# Patient Record
Sex: Female | Born: 1955 | Race: Black or African American | Hispanic: No | Smoking: Current every day smoker
Health system: Southern US, Community
[De-identification: ages and names within clinical notes are randomized; demographics above are authoritative.]

## PROBLEM LIST (undated history)

## (undated) DIAGNOSIS — K635 Polyp of colon: Secondary | ICD-10-CM

## (undated) DIAGNOSIS — F32A Depression, unspecified: Secondary | ICD-10-CM

## (undated) DIAGNOSIS — E039 Hypothyroidism, unspecified: Secondary | ICD-10-CM

## (undated) DIAGNOSIS — B9681 Helicobacter pylori [H. pylori] as the cause of diseases classified elsewhere: Secondary | ICD-10-CM

## (undated) DIAGNOSIS — T148XXA Other injury of unspecified body region, initial encounter: Secondary | ICD-10-CM

## (undated) DIAGNOSIS — R45851 Suicidal ideations: Secondary | ICD-10-CM

## (undated) DIAGNOSIS — K859 Acute pancreatitis without necrosis or infection, unspecified: Secondary | ICD-10-CM

## (undated) DIAGNOSIS — F329 Major depressive disorder, single episode, unspecified: Secondary | ICD-10-CM

## (undated) DIAGNOSIS — I10 Essential (primary) hypertension: Secondary | ICD-10-CM

## (undated) DIAGNOSIS — H269 Unspecified cataract: Secondary | ICD-10-CM

## (undated) DIAGNOSIS — K2970 Gastritis, unspecified, without bleeding: Secondary | ICD-10-CM

## (undated) DIAGNOSIS — F1910 Other psychoactive substance abuse, uncomplicated: Secondary | ICD-10-CM

## (undated) DIAGNOSIS — F419 Anxiety disorder, unspecified: Secondary | ICD-10-CM

## (undated) DIAGNOSIS — E7800 Pure hypercholesterolemia, unspecified: Secondary | ICD-10-CM

## (undated) DIAGNOSIS — M1990 Unspecified osteoarthritis, unspecified site: Secondary | ICD-10-CM

## (undated) DIAGNOSIS — M8580 Other specified disorders of bone density and structure, unspecified site: Secondary | ICD-10-CM

## (undated) DIAGNOSIS — E119 Type 2 diabetes mellitus without complications: Secondary | ICD-10-CM

## (undated) DIAGNOSIS — A048 Other specified bacterial intestinal infections: Secondary | ICD-10-CM

## (undated) DIAGNOSIS — K219 Gastro-esophageal reflux disease without esophagitis: Secondary | ICD-10-CM

## (undated) DIAGNOSIS — F1410 Cocaine abuse, uncomplicated: Secondary | ICD-10-CM

## (undated) DIAGNOSIS — F1210 Cannabis abuse, uncomplicated: Secondary | ICD-10-CM

## (undated) DIAGNOSIS — K5790 Diverticulosis of intestine, part unspecified, without perforation or abscess without bleeding: Secondary | ICD-10-CM

## (undated) DIAGNOSIS — F319 Bipolar disorder, unspecified: Secondary | ICD-10-CM

## (undated) HISTORY — PX: CHOLECYSTECTOMY: SHX55

## (undated) HISTORY — DX: Polyp of colon: K63.5

## (undated) HISTORY — DX: Helicobacter pylori (H. pylori) as the cause of diseases classified elsewhere: B96.81

## (undated) HISTORY — DX: Other specified bacterial intestinal infections: A04.8

## (undated) HISTORY — DX: Gastritis, unspecified, without bleeding: K29.70

## (undated) HISTORY — DX: Diverticulosis of intestine, part unspecified, without perforation or abscess without bleeding: K57.90

## (undated) HISTORY — DX: Gastro-esophageal reflux disease without esophagitis: K21.9

## (undated) HISTORY — DX: Acute pancreatitis without necrosis or infection, unspecified: K85.90

## (undated) HISTORY — PX: UPPER GASTROINTESTINAL ENDOSCOPY: SHX188

## (undated) HISTORY — DX: Unspecified osteoarthritis, unspecified site: M19.90

## (undated) HISTORY — DX: Other specified disorders of bone density and structure, unspecified site: M85.80

## (undated) HISTORY — DX: Major depressive disorder, single episode, unspecified: F32.9

## (undated) HISTORY — DX: Unspecified cataract: H26.9

## (undated) HISTORY — PX: PARTIAL HYSTERECTOMY: SHX80

## (undated) HISTORY — PX: KNEE SURGERY: SHX244

## (undated) HISTORY — PX: SPLENECTOMY: SUR1306

## (undated) HISTORY — DX: Depression, unspecified: F32.A

## (undated) HISTORY — PX: COLONOSCOPY: SHX174

## (undated) HISTORY — PX: ROTATOR CUFF REPAIR: SHX139

---

## 2015-04-11 ENCOUNTER — Encounter (HOSPITAL_COMMUNITY): Payer: Self-pay

## 2015-04-11 ENCOUNTER — Emergency Department (HOSPITAL_COMMUNITY)
Admission: EM | Admit: 2015-04-11 | Discharge: 2015-04-11 | Disposition: A | Discharge status: Home / Self Care | Payer: Medicare Other | Attending: Emergency Medicine | Admitting: Emergency Medicine

## 2015-04-11 DIAGNOSIS — L259 Unspecified contact dermatitis, unspecified cause: Secondary | ICD-10-CM

## 2015-04-11 DIAGNOSIS — M791 Myalgia: Secondary | ICD-10-CM | POA: Diagnosis not present

## 2015-04-11 DIAGNOSIS — Z7982 Long term (current) use of aspirin: Secondary | ICD-10-CM | POA: Insufficient documentation

## 2015-04-11 DIAGNOSIS — Z7984 Long term (current) use of oral hypoglycemic drugs: Secondary | ICD-10-CM | POA: Diagnosis not present

## 2015-04-11 DIAGNOSIS — F17200 Nicotine dependence, unspecified, uncomplicated: Secondary | ICD-10-CM | POA: Insufficient documentation

## 2015-04-11 DIAGNOSIS — F419 Anxiety disorder, unspecified: Secondary | ICD-10-CM | POA: Diagnosis not present

## 2015-04-11 DIAGNOSIS — Z79899 Other long term (current) drug therapy: Secondary | ICD-10-CM | POA: Insufficient documentation

## 2015-04-11 DIAGNOSIS — E039 Hypothyroidism, unspecified: Secondary | ICD-10-CM | POA: Insufficient documentation

## 2015-04-11 DIAGNOSIS — F329 Major depressive disorder, single episode, unspecified: Secondary | ICD-10-CM | POA: Insufficient documentation

## 2015-04-11 DIAGNOSIS — I10 Essential (primary) hypertension: Secondary | ICD-10-CM | POA: Insufficient documentation

## 2015-04-11 DIAGNOSIS — R21 Rash and other nonspecific skin eruption: Secondary | ICD-10-CM | POA: Diagnosis present

## 2015-04-11 HISTORY — DX: Type 2 diabetes mellitus without complications: E11.9

## 2015-04-11 HISTORY — DX: Other injury of unspecified body region, initial encounter: T14.8XXA

## 2015-04-11 HISTORY — DX: Pure hypercholesterolemia, unspecified: E78.00

## 2015-04-11 HISTORY — DX: Hypothyroidism, unspecified: E03.9

## 2015-04-11 HISTORY — DX: Anxiety disorder, unspecified: F41.9

## 2015-04-11 HISTORY — DX: Essential (primary) hypertension: I10

## 2015-04-11 MED ORDER — HYDROXYZINE HCL 25 MG PO TABS: 25.0000 mg | ORAL_TABLET | Freq: Four times a day (QID) | ORAL | Status: DC

## 2015-04-11 MED ORDER — PREGABALIN 100 MG PO CAPS: 100.0000 mg | ORAL_CAPSULE | Freq: Three times a day (TID) | ORAL | Status: DC

## 2015-04-11 MED ORDER — DEXAMETHASONE 4 MG PO TABS
10.0000 mg | ORAL_TABLET | Freq: Once | ORAL | Status: AC
  Administered 2015-04-11: 10 mg via ORAL
  Filled 2015-04-11: qty 3

## 2015-04-11 NOTE — Discharge Instructions (Signed)
Continue try cortisone cream on this. Try the Atarax. Follow-up with a family doctor. Take Tylenol for generalized aches. Return for sudden fever. Shortness of breath. Contact Dermatitis Dermatitis is redness, soreness, and swelling (inflammation) of the skin. Contact dermatitis is a reaction to certain substances that touch the skin. There are two types of contact dermatitis:   Irritant contact dermatitis. This type is caused by something that irritates your skin, such as dry hands from washing them too much. This type does not require previous exposure to the substance for a reaction to occur. This type is more common.  Allergic contact dermatitis. This type is caused by a substance that you are allergic to, such as a nickel allergy or poison ivy. This type only occurs if you have been exposed to the substance (allergen) before. Upon a repeat exposure, your body reacts to the substance. This type is less common. CAUSES  Many different substances can cause contact dermatitis. Irritant contact dermatitis is most commonly caused by exposure to:   Makeup.   Soaps.   Detergents.   Bleaches.   Acids.   Metal salts, such as nickel.  Allergic contact dermatitis is most commonly caused by exposure to:   Poisonous plants.   Chemicals.   Jewelry.   Latex.   Medicines.   Preservatives in products, such as clothing.  RISK FACTORS This condition is more likely to develop in:   People who have jobs that expose them to irritants or allergens.  People who have certain medical conditions, such as asthma or eczema.  SYMPTOMS  Symptoms of this condition may occur anywhere on your body where the irritant has touched you or is touched by you. Symptoms include:  Dryness or flaking.   Redness.   Cracks.   Itching.   Pain or a burning feeling.   Blisters.  Drainage of small amounts of blood or clear fluid from skin cracks. With allergic contact dermatitis, there may  also be swelling in areas such as the eyelids, mouth, or genitals.  DIAGNOSIS  This condition is diagnosed with a medical history and physical exam. A patch skin test may be performed to help determine the cause. If the condition is related to your job, you may need to see an occupational medicine specialist. TREATMENT Treatment for this condition includes figuring out what caused the reaction and protecting your skin from further contact. Treatment may also include:   Steroid creams or ointments. Oral steroid medicines may be needed in more severe cases.  Antibiotics or antibacterial ointments, if a skin infection is present.  Antihistamine lotion or an antihistamine taken by mouth to ease itching.  A bandage (dressing). HOME CARE INSTRUCTIONS Skin Care  Moisturize your skin as needed.   Apply cool compresses to the affected areas.  Try taking a bath with:  Epsom salts. Follow the instructions on the packaging. You can get these at your local pharmacy or grocery store.  Baking soda. Pour a small amount into the bath as directed by your health care provider.  Colloidal oatmeal. Follow the instructions on the packaging. You can get this at your local pharmacy or grocery store.  Try applying baking soda paste to your skin. Stir water into baking soda until it reaches a paste-like consistency.  Do not scratch your skin.  Bathe less frequently, such as every other day.  Bathe in lukewarm water. Avoid using hot water. Medicines  Take or apply over-the-counter and prescription medicines only as told by your health care provider.  If you were prescribed an antibiotic medicine, take or apply your antibiotic as told by your health care provider. Do not stop using the antibiotic even if your condition starts to improve. General Instructions  Keep all follow-up visits as told by your health care provider. This is important.  Avoid the substance that caused your reaction. If you  do not know what caused it, keep a journal to try to track what caused it. Write down:  What you eat.  What cosmetic products you use.  What you drink.  What you wear in the affected area. This includes jewelry.  If you were given a dressing, take care of it as told by your health care provider. This includes when to change and remove it. SEEK MEDICAL CARE IF:   Your condition does not improve with treatment.  Your condition gets worse.  You have signs of infection such as swelling, tenderness, redness, soreness, or warmth in the affected area.  You have a fever.  You have new symptoms. SEEK IMMEDIATE MEDICAL CARE IF:   You have a severe headache, neck pain, or neck stiffness.  You vomit.  You feel very sleepy.  You notice red streaks coming from the affected area.  Your bone or joint underneath the affected area becomes painful after the skin has healed.  The affected area turns darker.  You have difficulty breathing.   This information is not intended to replace advice given to you by your health care provider. Make sure you discuss any questions you have with your health care provider.   Document Released: 12/24/1999 Document Revised: 09/16/2014 Document Reviewed: 05/13/2014 Elsevier Interactive Patient Education [employer].

## 2015-04-11 NOTE — ED Provider Notes (Signed)
CSN: [id number]     Arrival date & time 04/11/15  1348 History   First MD Initiated Contact with Patient 04/11/15 1740     Chief Complaint  Patient presents with  . Rash  . Anxiety     (Consider location/radiation/quality/duration/timing/severity/associated sxs/prior Treatment) Patient is a 60 y.o. female presenting with rash and anxiety. The history is provided by the patient.  Rash Location:  Torso Torso rash location:  L chest and R chest Quality: itchiness   Severity:  Moderate Onset quality:  Gradual Duration:  1 week Timing:  Constant Progression:  Worsening Chronicity:  New Relieved by:  Nothing Worsened by:  Nothing tried Ineffective treatments:  None tried Associated symptoms: myalgias   Associated symptoms: no fever, no headaches, no joint pain, no nausea, no shortness of breath, not vomiting and not wheezing   Anxiety Pertinent negatives include no chest pain, no headaches and no shortness of breath.   60 yo F With a chief complaint of an itchy rash. Is localized to her neck and her upper chest. Going on for the past 3 or 4 days. Tried Benadryl without relief. Patient has recently moved from [place] and she has been without her medications. Patient is concerned because she feels like she needs her Lyrica to survive. Patient started feeling some pains in bilateral legs. Having some aches all over. Denies any other infectious symptoms. She called the nurse triage line in [place] and they told her that she might have  Mites. Even though she has recently moved to the area she denies any new lotions creams. Denies any new laundry detergent. Denies any new necklaces  Past Medical History  Diagnosis Date  . Anxiety   . Hypertension   . Diabetes mellitus without complication ([place])   . Hypercholesteremia   . Hypothyroid   . Nerve damage     both legs   History reviewed. No pertinent past surgical history. No family history on file. Social History  Substance Use Topics   . Smoking status: Current Every Day Smoker  . Smokeless tobacco: None  . Alcohol Use: No   OB History    No data available     Review of Systems  Constitutional: Negative for fever and chills.  HENT: Negative for congestion and rhinorrhea.   Eyes: Negative for redness and visual disturbance.  Respiratory: Negative for shortness of breath and wheezing.   Cardiovascular: Negative for chest pain and palpitations.  Gastrointestinal: Negative for nausea and vomiting.  Genitourinary: Negative for dysuria and urgency.  Musculoskeletal: Positive for myalgias. Negative for arthralgias.  Skin: Positive for rash. Negative for pallor and wound.  Neurological: Negative for dizziness and headaches.      Allergies  Review of patient's allergies indicates not on file.  Home Medications   Prior to Admission medications   Medication Sig Start Date End Date Taking? Authorizing Provider  ALPRAZolam [name]) 1 MG tablet Take 1 mg by mouth 3 (three) times daily as needed for anxiety.   Yes Historical Provider, MD  aspirin EC 81 MG tablet Take 81 mg by mouth daily at 12 noon.   Yes Historical Provider, MD  levothyroxine (SYNTHROID, LEVOTHROID) 25 MCG tablet Take 25 mcg by mouth daily before breakfast.   Yes Historical Provider, MD  metFORMIN (GLUCOPHAGE) 500 MG tablet Take 500 mg by mouth daily with breakfast.   Yes Historical Provider, MD  omeprazole (PRILOSEC) 20 MG capsule Take 20 mg by mouth daily.   Yes Historical Provider, MD  QUEtiapine (SEROQUEL)  400 MG tablet Take 400 mg by mouth at bedtime.   Yes Historical Provider, MD  QUEtiapine (SEROQUEL) 50 MG tablet Take 50 mg by mouth daily at 12 noon.   Yes Historical Provider, MD  zolpidem (AMBIEN) 10 MG tablet Take 10 mg by mouth at bedtime.   Yes Historical Provider, MD  hydrOXYzine (ATARAX/VISTARIL) 25 MG tablet Take 1 tablet (25 mg total) by mouth every 6 (six) hours. 04/11/15   Deno Etienne, DO  pregabalin (LYRICA) 100 MG capsule Take 1 capsule  (100 mg total) by mouth 3 (three) times daily. 04/11/15   Deno Etienne, DO   BP 150/91 mmHg  Pulse 84  Temp(Src) 98.2 F (36.8 C) (Oral)  Resp 16  Ht 5\' 5"  (1.651 m)  Wt 172 lb 9.6 oz (78.291 kg)  BMI 28.72 kg/m2  SpO2 100% Physical Exam  Constitutional: She is oriented to person, place, and time. She appears well-developed and well-nourished. No distress.  HENT:  Head: Normocephalic and atraumatic.  Eyes: EOM are normal. Pupils are equal, round, and reactive to light.  Neck: Normal range of motion. Neck supple.  Cardiovascular: Normal rate and regular rhythm.  Exam reveals no gallop and no friction rub.   No murmur heard. Pulmonary/Chest: Effort normal. She has no wheezes. She has no rales.  Abdominal: Soft. She exhibits no distension. There is no tenderness.  Musculoskeletal: She exhibits no edema or tenderness.  Neurological: She is alert and oriented to person, place, and time.  Skin: Skin is warm and dry. She is not diaphoretic.  Excoriations about the neck and the upper chest. Hyperpigmented lesions with central core noted to the chest.  Psychiatric: She has a normal mood and affect. Her behavior is normal.  Nursing note and vitals reviewed.   ED Course  Procedures (including critical care time) Labs Review Labs Reviewed - No data to display  Imaging Review No results found. I have personally reviewed and evaluated these images and lab results as part of my medical decision-making.   EKG Interpretation None      MDM   Final diagnoses:  Contact dermatitis    61 yo F the chief complaint of a rash. Feel like this is likely a contact issue as its in the exposed area. Appears to be itchy. Patient without any other significant systemic symptoms. Will give the patient a dose of steroids as well as Atarax. Patient was given 1 week's worth of Lyrica. Given follow-up for a neurosurgeon as well as a gastroenterologist as well as the family clinic.  6:50 PM:  I have discussed  the diagnosis/risks/treatment options with the patient and believe the pt to be eligible for discharge home to follow-up with PCP. We also discussed returning to the ED immediately if new or worsening sx occur. We discussed the sx which are most concerning (e.g., sudden worsening pain, fever, inability to tolerate by mouth) that necessitate immediate return. Medications administered to the patient during their visit and any new prescriptions provided to the patient are listed below.  Medications given during this visit Medications  dexamethasone (DECADRON) tablet 10 mg (10 mg Oral Given 04/11/15 1829)    There are no discharge medications for this patient.   The patient appears reasonably screen and/or stabilized for discharge and I doubt any other medical condition or other [facility] requiring further screening, evaluation, or treatment in the ED at this time prior to discharge.      Deno Etienne, DO 04/11/15 1850

## 2015-04-11 NOTE — ED Notes (Signed)
Pt reports burning/itching rash to right side of chest that she noticed on Monday. OTC meds not working. She reports she felt very anxious with the itching. She took two benadryl tablets at 11am this morning.

## 2015-04-11 NOTE — ED Notes (Signed)
MD assessed and discharged pt before RN assessment.

## 2015-04-13 ENCOUNTER — Telehealth: Payer: Self-pay | Admitting: *Deleted

## 2015-04-13 NOTE — Telephone Encounter (Signed)
Pt called stating she was unsuccessful in obtaining PCP appointment and is in need of maintenance medications.  ERCM scheduled appointment 4/18 @ 2:30 with Cammie Sickle, NP at [facility].

## 2015-04-15 ENCOUNTER — Inpatient Hospital Stay: Payer: Medicare Other

## 2015-04-21 ENCOUNTER — Emergency Department (HOSPITAL_COMMUNITY): Payer: Medicare Other

## 2015-04-21 ENCOUNTER — Emergency Department (HOSPITAL_COMMUNITY)
Admission: EM | Admit: 2015-04-21 | Discharge: 2015-04-22 | Disposition: A | Discharge status: Other Acute Inpatient Hospital | Payer: Medicare Other | Attending: Emergency Medicine | Admitting: Emergency Medicine

## 2015-04-21 ENCOUNTER — Encounter (HOSPITAL_COMMUNITY): Payer: Self-pay | Admitting: Family Medicine

## 2015-04-21 DIAGNOSIS — E119 Type 2 diabetes mellitus without complications: Secondary | ICD-10-CM | POA: Insufficient documentation

## 2015-04-21 DIAGNOSIS — Z79899 Other long term (current) drug therapy: Secondary | ICD-10-CM | POA: Insufficient documentation

## 2015-04-21 DIAGNOSIS — Z7982 Long term (current) use of aspirin: Secondary | ICD-10-CM | POA: Insufficient documentation

## 2015-04-21 DIAGNOSIS — E782 Mixed hyperlipidemia: Secondary | ICD-10-CM | POA: Insufficient documentation

## 2015-04-21 DIAGNOSIS — R443 Hallucinations, unspecified: Secondary | ICD-10-CM | POA: Insufficient documentation

## 2015-04-21 DIAGNOSIS — F419 Anxiety disorder, unspecified: Secondary | ICD-10-CM | POA: Insufficient documentation

## 2015-04-21 DIAGNOSIS — F1210 Cannabis abuse, uncomplicated: Secondary | ICD-10-CM | POA: Insufficient documentation

## 2015-04-21 DIAGNOSIS — R45851 Suicidal ideations: Secondary | ICD-10-CM

## 2015-04-21 DIAGNOSIS — F1310 Sedative, hypnotic or anxiolytic abuse, uncomplicated: Secondary | ICD-10-CM | POA: Insufficient documentation

## 2015-04-21 DIAGNOSIS — E039 Hypothyroidism, unspecified: Secondary | ICD-10-CM | POA: Insufficient documentation

## 2015-04-21 DIAGNOSIS — F17200 Nicotine dependence, unspecified, uncomplicated: Secondary | ICD-10-CM | POA: Insufficient documentation

## 2015-04-21 DIAGNOSIS — I10 Essential (primary) hypertension: Secondary | ICD-10-CM | POA: Insufficient documentation

## 2015-04-21 LAB — CBC
HCT: 41.8 % (ref 36.0–46.0)
Hemoglobin: 13.4 g/dL (ref 12.0–15.0)
MCH: 28.1 pg (ref 26.0–34.0)
MCHC: 32.1 g/dL (ref 30.0–36.0)
MCV: 87.6 fL (ref 78.0–100.0)
PLATELETS: 385 10*3/uL (ref 150–400)
RBC: 4.77 MIL/uL (ref 3.87–5.11)
RDW: 14.7 % (ref 11.5–15.5)
WBC: 9.4 10*3/uL (ref 4.0–10.5)

## 2015-04-21 LAB — COMPREHENSIVE METABOLIC PANEL
ALK PHOS: 77 U/L (ref 38–126)
ALT: 17 U/L (ref 14–54)
ANION GAP: 16 — AB (ref 5–15)
AST: 22 U/L (ref 15–41)
Albumin: 3.4 g/dL — ABNORMAL LOW (ref 3.5–5.0)
BUN: 8 mg/dL (ref 6–20)
CO2: 17 mmol/L — ABNORMAL LOW (ref 22–32)
Calcium: 9.7 mg/dL (ref 8.9–10.3)
Chloride: 108 mmol/L (ref 101–111)
Creatinine, Ser: 0.67 mg/dL (ref 0.44–1.00)
GFR calc Af Amer: >60 mL/min (ref >60)
GLUCOSE: 118 mg/dL — AB (ref 65–99)
POTASSIUM: 3.8 mmol/L (ref 3.5–5.1)
Sodium: 141 mmol/L (ref 135–145)
TOTAL PROTEIN: 7.2 g/dL (ref 6.5–8.1)
Total Bilirubin: 0.6 mg/dL (ref 0.3–1.2)

## 2015-04-21 LAB — LITHIUM LEVEL: Lithium Lvl: <0.06 mmol/L — ABNORMAL LOW (ref 0.60–1.20)

## 2015-04-21 LAB — RAPID URINE DRUG SCREEN, HOSP PERFORMED
Amphetamines: NOT DETECTED
BENZODIAZEPINES: POSITIVE — AB
Barbiturates: NOT DETECTED
COCAINE: NOT DETECTED
Opiates: NOT DETECTED
Tetrahydrocannabinol: POSITIVE — AB

## 2015-04-21 LAB — I-STAT TROPONIN, ED: Troponin i, poc: 0 ng/mL (ref 0.00–0.08)

## 2015-04-21 LAB — SALICYLATE LEVEL: Salicylate Lvl: <4 mg/dL (ref 2.8–30.0)

## 2015-04-21 LAB — ETHANOL

## 2015-04-21 LAB — ACETAMINOPHEN LEVEL

## 2015-04-21 MED ORDER — PANTOPRAZOLE SODIUM 40 MG PO TBEC
40.0000 mg | DELAYED_RELEASE_TABLET | Freq: Every day | ORAL | Status: DC
  Administered 2015-04-21: 40 mg via ORAL
  Filled 2015-04-21: qty 1

## 2015-04-21 MED ORDER — NICOTINE 21 MG/24HR TD PT24
21.0000 mg | MEDICATED_PATCH | Freq: Every day | TRANSDERMAL | Status: DC
  Administered 2015-04-21: 21 mg via TRANSDERMAL
  Filled 2015-04-21: qty 1

## 2015-04-21 MED ORDER — LORAZEPAM 0.5 MG PO TABS
0.5000 mg | ORAL_TABLET | Freq: Once | ORAL | Status: AC
  Administered 2015-04-21: 0.5 mg via ORAL
  Filled 2015-04-21: qty 1

## 2015-04-21 MED ORDER — ZOLPIDEM TARTRATE 5 MG PO TABS
10.0000 mg | ORAL_TABLET | Freq: Every day | ORAL | Status: DC
  Administered 2015-04-21: 10 mg via ORAL
  Filled 2015-04-21: qty 2

## 2015-04-21 MED ORDER — QUETIAPINE FUMARATE 25 MG PO TABS: 50.0000 mg | ORAL_TABLET | Freq: Every day | ORAL | Status: DC

## 2015-04-21 MED ORDER — LORAZEPAM 1 MG PO TABS
1.0000 mg | ORAL_TABLET | Freq: Three times a day (TID) | ORAL | Status: DC | PRN
  Administered 2015-04-21: 1 mg via ORAL
  Filled 2015-04-21 (×2): qty 1

## 2015-04-21 MED ORDER — IBUPROFEN 400 MG PO TABS: 600.0000 mg | ORAL_TABLET | Freq: Three times a day (TID) | ORAL | Status: DC | PRN

## 2015-04-21 MED ORDER — METFORMIN HCL 500 MG PO TABS: 500.0000 mg | ORAL_TABLET | Freq: Every day | ORAL | Status: DC

## 2015-04-21 MED ORDER — HYDROXYZINE HCL 25 MG PO TABS
25.0000 mg | ORAL_TABLET | Freq: Four times a day (QID) | ORAL | Status: DC
  Administered 2015-04-21 (×2): 25 mg via ORAL
  Filled 2015-04-21 (×2): qty 1

## 2015-04-21 MED ORDER — LEVOTHYROXINE SODIUM 25 MCG PO TABS
25.0000 ug | ORAL_TABLET | Freq: Every day | ORAL | Status: DC
  Filled 2015-04-21: qty 1

## 2015-04-21 MED ORDER — PREGABALIN 100 MG PO CAPS
100.0000 mg | ORAL_CAPSULE | Freq: Three times a day (TID) | ORAL | Status: DC
  Filled 2015-04-21: qty 2

## 2015-04-21 MED ORDER — QUETIAPINE FUMARATE 200 MG PO TABS
400.0000 mg | ORAL_TABLET | Freq: Every day | ORAL | Status: DC
  Administered 2015-04-21: 400 mg via ORAL
  Filled 2015-04-21: qty 2

## 2015-04-21 MED ORDER — LORAZEPAM 1 MG PO TABS
1.0000 mg | ORAL_TABLET | Freq: Once | ORAL | Status: AC
  Administered 2015-04-21: 1 mg via ORAL

## 2015-04-21 NOTE — ED Notes (Signed)
Staffing  Notified that a sitter is needed.
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
## 2015-04-21 NOTE — Progress Notes (Signed)
[facility] RN Maudie Mercury was informed that patient can arrive at [facility] after midnight.  Verlon Setting, [place] Disposition staff 04/21/2015 8:29 PM

## 2015-04-21 NOTE — ED Notes (Signed)
Pt yelling and cursing, stating that all the staff is raciest.  Pt requesting to speak to MD.  Dr. Winfred Leeds made aware.   When Dr. Winfred Leeds went into room to speak to pt.  Pt started yelling again and cursing him.  Security  And [employer] outside of room

## 2015-04-21 NOTE — ED Notes (Signed)
TTS is in progress in PT room.

## 2015-04-21 NOTE — ED Notes (Addendum)
Pt here with symptoms of anxiety. sts she is tired of the cycle she is in. sts she has been going through this too long. sts depressed. 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

## 2015-04-21 NOTE — ED Notes (Signed)
Patient became upset about the rules in the hand out in Pod C... Patient stated that the rules of the hospital didn't apply to her life style... She stated that she breaks the rules and no body will tell her what to do.Marland KitchenMarland KitchenPatient called her son got upset and hung up on him and then made another phone call... Patient tried to make other calls.Nurse stated that she has used her two calls already.Marland KitchenMarland KitchenPatient stated that she needed more the 2 calls to handle her business. I reminded her of the rules of 2 phone calls.. She stated that I was unprofessional and making her upset she didn't need staff telling her no rules she knew the rules and didn't care about them and that as a man I know I was wrong and the was rules was stupid and made no sense.Marland KitchenMarland KitchenSecurity came to calm her down and she told the security officer that he was racist and she hated racist people and everyone need to leave her alone.... She continued to cuss at staff and security and wanted everybody's name so she could report everyone to administration.Marland KitchenMarland Kitchen

## 2015-04-21 NOTE — ED Provider Notes (Signed)
11 PM patient is resting comfortably in bed, reading a book after treatment with oral Ativan  Orlie Dakin, MD 04/21/15 2320
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
## 2015-04-21 NOTE — ED Provider Notes (Signed)
4:15 PM: Care assumed from [facility], PA-C at shift change. Patient with PMH of anxiety and hypertension who presents with SI.  Some chest tightness x 2 days, EKG without acute abnormalities, troponin 0.00. Unlikely ACS given constant chest tightness x 2 days and no elevation of troponin.  Likely anxiety related.  Patient has had no complaints since arriving in the ED. Remaining labs without acute abnormalities. TTS has been consulted for appropriate disposition.  Home meds have been reordered.  7:00 PM: Patient moved to [facility], PA-C 04/21/15 [id number]  Merrily Pew, MD 04/24/15 [phone]

## 2015-04-21 NOTE — ED Provider Notes (Signed)
8:30 PM patient is yelling at the top of her lungs and stating that all nursing staff attack staff is conspiring against her. She is wishes to leave the hospital. ED records shows suicidal ideation and auditory hallucinations. I've filed involuntary commitment forms to keep patient here as well as first exam form. Additional oral Ativan ordered  Orlie Dakin, MD 04/21/15 [id number]

## 2015-04-22 ENCOUNTER — Inpatient Hospital Stay (HOSPITAL_COMMUNITY)
Admission: AD | Admit: 2015-04-22 | Discharge: 2015-04-25 | DRG: 885 | Disposition: A | Discharge status: Home / Self Care | Payer: 59 | Source: Intra-hospital | Attending: Psychiatry | Admitting: Psychiatry

## 2015-04-22 ENCOUNTER — Encounter (HOSPITAL_COMMUNITY): Payer: Self-pay | Admitting: *Deleted

## 2015-04-22 DIAGNOSIS — E039 Hypothyroidism, unspecified: Secondary | ICD-10-CM

## 2015-04-22 DIAGNOSIS — E785 Hyperlipidemia, unspecified: Secondary | ICD-10-CM

## 2015-04-22 DIAGNOSIS — E872 Acidosis, unspecified: Secondary | ICD-10-CM

## 2015-04-22 DIAGNOSIS — F17210 Nicotine dependence, cigarettes, uncomplicated: Secondary | ICD-10-CM | POA: Diagnosis present

## 2015-04-22 DIAGNOSIS — E1165 Type 2 diabetes mellitus with hyperglycemia: Secondary | ICD-10-CM | POA: Diagnosis present

## 2015-04-22 DIAGNOSIS — R45851 Suicidal ideations: Secondary | ICD-10-CM | POA: Diagnosis present

## 2015-04-22 DIAGNOSIS — F3130 Bipolar disorder, current episode depressed, mild or moderate severity, unspecified: Secondary | ICD-10-CM | POA: Diagnosis not present

## 2015-04-22 DIAGNOSIS — I10 Essential (primary) hypertension: Secondary | ICD-10-CM | POA: Diagnosis present

## 2015-04-22 DIAGNOSIS — F3163 Bipolar disorder, current episode mixed, severe, without psychotic features: Principal | ICD-10-CM | POA: Diagnosis present

## 2015-04-22 DIAGNOSIS — F319 Bipolar disorder, unspecified: Secondary | ICD-10-CM | POA: Diagnosis present

## 2015-04-22 DIAGNOSIS — E038 Other specified hypothyroidism: Secondary | ICD-10-CM | POA: Diagnosis not present

## 2015-04-22 LAB — GLUCOSE, CAPILLARY
GLUCOSE-CAPILLARY: 166 mg/dL — AB (ref 65–99)
GLUCOSE-CAPILLARY: 120 mg/dL — AB (ref 65–99)
Glucose-Capillary: 126 mg/dL — ABNORMAL HIGH (ref 65–99)

## 2015-04-22 MED ORDER — ALPRAZOLAM 0.5 MG PO TABS
1.0000 mg | ORAL_TABLET | Freq: Three times a day (TID) | ORAL | Status: DC | PRN
  Administered 2015-04-22: 1 mg via ORAL
  Filled 2015-04-22: qty 2

## 2015-04-22 MED ORDER — ACETAMINOPHEN 325 MG PO TABS: 650.0000 mg | ORAL_TABLET | Freq: Four times a day (QID) | ORAL | Status: DC | PRN

## 2015-04-22 MED ORDER — QUETIAPINE FUMARATE 100 MG PO TABS
100.0000 mg | ORAL_TABLET | Freq: Once | ORAL | Status: DC
  Filled 2015-04-22: qty 1

## 2015-04-22 MED ORDER — INSULIN ASPART 100 UNIT/ML ~~LOC~~ SOLN: 4.0000 [IU] | Freq: Three times a day (TID) | SUBCUTANEOUS | Status: DC

## 2015-04-22 MED ORDER — AMLODIPINE BESYLATE 5 MG PO TABS
5.0000 mg | ORAL_TABLET | Freq: Every day | ORAL | Status: DC
  Administered 2015-04-22 – 2015-04-25 (×4): 5 mg via ORAL
  Filled 2015-04-22 (×6): qty 1
  Filled 2015-04-22: qty 3

## 2015-04-22 MED ORDER — QUETIAPINE FUMARATE 400 MG PO TABS
400.0000 mg | ORAL_TABLET | Freq: Every day | ORAL | Status: DC
  Filled 2015-04-22 (×2): qty 1

## 2015-04-22 MED ORDER — ALPRAZOLAM 0.5 MG PO TABS: 1.0000 mg | ORAL_TABLET | Freq: Two times a day (BID) | ORAL | Status: DC

## 2015-04-22 MED ORDER — ALPRAZOLAM 0.5 MG PO TABS
1.0000 mg | ORAL_TABLET | Freq: Three times a day (TID) | ORAL | Status: DC
  Administered 2015-04-22 – 2015-04-25 (×8): 1 mg via ORAL
  Filled 2015-04-22 (×8): qty 2

## 2015-04-22 MED ORDER — METFORMIN HCL 500 MG PO TABS
500.0000 mg | ORAL_TABLET | Freq: Every day | ORAL | Status: DC
  Administered 2015-04-22 – 2015-04-25 (×4): 500 mg via ORAL
  Filled 2015-04-22 (×6): qty 1

## 2015-04-22 MED ORDER — ASPIRIN EC 81 MG PO TBEC
81.0000 mg | DELAYED_RELEASE_TABLET | Freq: Every day | ORAL | Status: DC
  Administered 2015-04-22 – 2015-04-24 (×3): 81 mg via ORAL
  Filled 2015-04-22 (×5): qty 1

## 2015-04-22 MED ORDER — PANTOPRAZOLE SODIUM 40 MG PO TBEC
40.0000 mg | DELAYED_RELEASE_TABLET | Freq: Every day | ORAL | Status: DC
  Administered 2015-04-22 – 2015-04-25 (×4): 40 mg via ORAL
  Filled 2015-04-22 (×6): qty 1

## 2015-04-22 MED ORDER — ALUM & MAG HYDROXIDE-SIMETH 200-200-20 MG/5ML PO SUSP: 30.0000 mL | ORAL | Status: DC | PRN

## 2015-04-22 MED ORDER — LEVOTHYROXINE SODIUM 25 MCG PO TABS
25.0000 ug | ORAL_TABLET | Freq: Every day | ORAL | Status: DC
  Administered 2015-04-22 – 2015-04-25 (×4): 25 ug via ORAL
  Filled 2015-04-22 (×7): qty 1

## 2015-04-22 MED ORDER — INSULIN ASPART 100 UNIT/ML ~~LOC~~ SOLN
0.0000 [IU] | Freq: Three times a day (TID) | SUBCUTANEOUS | Status: DC
  Administered 2015-04-22: 0 [IU] via SUBCUTANEOUS

## 2015-04-22 MED ORDER — QUETIAPINE FUMARATE 200 MG PO TABS
200.0000 mg | ORAL_TABLET | Freq: Every day | ORAL | Status: DC
  Administered 2015-04-22: 200 mg via ORAL
  Filled 2015-04-22 (×3): qty 1

## 2015-04-22 MED ORDER — MAGNESIUM HYDROXIDE 400 MG/5ML PO SUSP: 30.0000 mL | Freq: Every day | ORAL | Status: DC | PRN

## 2015-04-22 MED ORDER — AMITRIPTYLINE HCL 25 MG PO TABS
25.0000 mg | ORAL_TABLET | Freq: Every day | ORAL | Status: DC
  Filled 2015-04-22 (×4): qty 1

## 2015-04-22 MED ORDER — LITHIUM CARBONATE 300 MG PO CAPS
300.0000 mg | ORAL_CAPSULE | Freq: Two times a day (BID) | ORAL | Status: DC
  Administered 2015-04-22 – 2015-04-25 (×7): 300 mg via ORAL
  Filled 2015-04-22 (×6): qty 1
  Filled 2015-04-22: qty 6
  Filled 2015-04-22 (×4): qty 1
  Filled 2015-04-22: qty 6

## 2015-04-22 NOTE — Tx Team (Signed)
Interdisciplinary Treatment Plan Update (Adult)  Date:  04/22/2015 Time Reviewed:  4:23 PM  Progress in Treatment: Attending groups: Yes. Participating in groups: Yes. Taking medication as prescribed:  Yes. Tolerating medication:  Yes. Family/Significant othe contact made:  No, will contact:  no one. Pt declined collateral contact Patient understands diagnosis:  Yes, as evidenced by seeking help with AVH and depression. Discussing patient identified problems/goals with staff:  Yes, see initial care plan. Medical problems stabilized or resolved:  Yes Denies suicidal/homicidal ideation: Yes. Issues/concerns per patient self-inventory: No. Other:  New problem(s) identified:   Discharge Plan or Barriers:  Reason for Continuation of Hospitalization: Depression Hallucinations Medication stabilization Suicidal ideation  Comments: Client is a 60 yo female admitted involuntarily, with complaints of "increased depression and suicidal ideations. Client reports she moved to [place] from [place] about three weeks ago to live with her son and his wife and found "he's a raging alcoholic" "He goes and drinks up his lil change and she flaunts her check in front of him and calls him names, makes him mad" This is client's first admission to [facility], per history had other admission at another facility. "I'm depressed" "hear voices all the time" "I was suicidal" "having outburst" "mood swings" "family said need get back on my medication" "I hear voice all the time" Client reports medical history of diabetes, neuropathy, DDD, HTN, hypothyroidism" Client is oriented to unit and room. Received orders. Staff will monitor q32mn for safety. Client is safe on the unit. Xanax, Lithium, Seroquel trial  Estimated length of stay: 4-5 days  New goal(s):  Review of initial/current patient goals per problem list:  1. Goal(s): Patient will participate in aftercare plan  Met: No   Target date: at discharge  As  evidenced by: Patient will participate within aftercare plan AEB aftercare provider and housing plan at discharge being identified. 04/22/15: Pt will follow up outpt with outpt provider. Pt's housing arrangements are currently unknown.   2. Goal (s): Patient will exhibit decreased depressive symptoms and suicidal ideations.  Met:No  Target date: at discharge  As evidenced by: Patient will utilize self rating of depression at 3 or below and demonstrate decreased signs of depression or be deemed stable for discharge by MD.  04/22/15: Pt currently endorses SI and depressive sx.  4. Goal(s): Patient will demonstrate decreased signs of psychosis.  Met: No  Target date:at discharge  As evidenced by: Patient will demonstrate decreased signs of psychosis as evidenced by a reduction in AVH, paranoia, and/or delusions.   04/22/15: Pt seems to be responding to internal stimuli as evidenced by isolating and thought blocking.  Attendees: Patient:  04/22/2015 4:23 PM  Family:   04/22/2015 4:23 PM  Physician:  Dr. SUrsula Alert MD 04/22/2015 4:23 PM  Nursing:  ELarrie Kass, RN 04/22/2015 4:23 PM  Case Manager:  RRoque Lias LCSW 04/22/2015 4:23 PM  Counselor:  LMatthew Saras MSW Intern 04/22/2015 4:23 PM  Other:   04/22/2015 4:23 PM  Other:   04/22/2015 4:23 PM  Other:   04/22/2015 4:23 PM  Other:  04/22/2015 4:23 PM  Other:    Other:    Other:    Other:    Other:    Other:      Scribe for Treatment Team:   LGeorga Kaufmann MSW Intern 04/22/2015 4:23 PM

## 2015-04-22 NOTE — BHH Suicide Risk Assessment (Signed)
[facility] INPATIENT:  Family/Significant Other Suicide Prevention Education  Suicide Prevention Education:  Patient Refusal for Family/Significant Other Suicide Prevention Education: The patient Melanie Foster has refused to provide written consent for family/significant other to be provided Family/Significant Other Suicide Prevention Education during admission and/or prior to discharge.  Physician notified.  Georga Kaufmann 04/22/2015, 4:28 PM
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
## 2015-04-22 NOTE — ED Provider Notes (Signed)
12:40 AM patient is alert and ambulatory. She is stable for transfer to [facility] accepting physician Dr.Eappen  Orlie Dakin, MD 04/22/15 [phone]

## 2015-04-22 NOTE — Progress Notes (Signed)
DAR NOTE: Patient presents with anxious affect and depressed mood.  Denies pain, auditory and visual hallucinations.  Describes energy level as normal and concentration as good.  Maintained on routine safety checks.  Medications given as prescribed.  Support and encouragement offered as needed.  Attended group and participated.  Patient observed socializing with peers in the dayroom.  Patient requested and received Xanax 1 mg for complain of anxiety with good effect.  No signs and symptoms of hypoglycemia reported or noted.  Patient refused prescribed insulin dosage.  Patient states "I don't take Insulin and I'm not about to start."

## 2015-04-22 NOTE — Progress Notes (Addendum)
Patient ID: Melanie Foster, female   DOB: [birth year], 60 y.o.   MRN: [id number] Client is a 60 yo female admitted involuntarily, with complaints of "increased depression and suicidal ideations. Client reports she moved to [place] from [place] about three weeks ago to live with her son and his wife and found "he's a raging alcoholic" "He goes and drinks up his lil change and she flaunts her check in front of him and calls him names, makes him mad" This is client's first admission to [facility], per history had other admission at another facility. "I'm depressed" "hear voices all the time" "I was suicidal" "having outburst" "mood swings" "family said need get back on my medication" "I hear voice all the time"  Client reports medical history of diabetes, neuropathy, DDD, HTN, hypothyroidism" Client is oriented to unit and room. Received orders. Staff will monitor q46min for safety. Client is safe on the unit.

## 2015-04-22 NOTE — Tx Team (Signed)
Initial Interdisciplinary Treatment Plan   PATIENT STRESSORS: Financial difficulties Health problems Marital or family conflict Medication change or noncompliance Substance abuse   PATIENT STRENGTHS: Active sense of humor Average or above average intelligence Communication skills General fund of knowledge Motivation for treatment/growth Supportive family/friends   PROBLEM LIST: Problem List/Patient Goals Date to be addressed Date deferred Reason deferred Estimated date of resolution  "increased depression" 04-22-15     "outbursts" 04-22-15     "lot of mood swings" 04-22-15     "I was having suicidal thought" 04-22-15     "I hear voices all the time" 04-22-15                              DISCHARGE CRITERIA:  Ability to meet basic life and health needs Improved stabilization in mood, thinking, and/or behavior Reduction of life-threatening or endangering symptoms to within safe limits Verbal commitment to aftercare and medication compliance  PRELIMINARY DISCHARGE PLAN: Outpatient therapy Participate in family therapy Return to previous living arrangement  PATIENT/FAMIILY INVOLVEMENT: This treatment plan has been presented to and reviewed with the patient, Melanie Foster, and/or family member.  The patient and family have been given the opportunity to ask questions and make suggestions.  Zoe Lan 04/22/2015, 5:48 AM

## 2015-04-22 NOTE — Progress Notes (Signed)
[place] Group Notes:  (Nursing/MHT/Case Management/Adjunct)  Date:  04/22/2015  Time:  10:07 PM  Type of Therapy:  Psychoeducational Skills  Participation Level:  Active  Participation Quality:  Monopolizing  Affect:  Anxious  Cognitive:  Appropriate  Insight:  Good  Engagement in Group:  Monopolizing and Off Topic  Modes of Intervention:  Education  Summary of Progress/Problems: The patient verbalized in group that she had a very bad day. First of all, the patient mentioned that she is having a headache and that the issue has not been addressed. Secondly, she mentioned that she is a diabetic and that she is experiencing quite a bit of pain. In addition, she is concerned about her belongings that are in her locker and that she is having difficulty accessing telephone numbers from her phone and would also like to remove some money from her locker. As a goal for tomorrow, she would like to work on [employer] and crafts". She has also mentioned that she is having difficulty remembering things since she just recently moved to this area from [place].   Archie Balboa S 04/22/2015, 10:07 PM

## 2015-04-22 NOTE — BHH Group Notes (Signed)
[facility] Group Notes:  (Counselor/Nursing/MHT/Case Management/Adjunct)  04/22/2015 1:15PM  Type of Therapy:  Group Therapy  Participation Level:  Active  Participation Quality:  Appropriate  Affect:  Flat  Cognitive:  Oriented  Insight:  Improving  Engagement in Group:  Limited  Engagement in Therapy:  Limited  Modes of Intervention:  Discussion, Exploration and Socialization  Summary of Progress/Problems: The topic for group was balance in life.  Pt participated in the discussion about when their life was in balance and out of balance and how this feels.  Pt discussed ways to get back in balance and short term goals they can work on to get where they want to be.  Invited.  Chose to not attend.   Roque Lias B 04/22/2015 1:15 PM

## 2015-04-22 NOTE — H&P (Signed)
Psychiatric Admission Assessment Adult  Patient Identification: Apolonia Ellwood MRN:  [id number]  Date of Evaluation:  04/22/2015  Chief Complaint:  Worsening symptoms of Bipolar disorder  Principal Diagnosis:Bipolar 1 disorder, mixed, severe ([place])  Diagnosis:   Patient Active Problem List   Diagnosis Date Noted  . Bipolar 1 disorder, mixed, severe ([place]) [F31.63] 04/22/2015   History of Present Illness: Zahava is a 60 year old African-American female. Admitted to [facility] from the [facility] long hospital with complaints of worsening symptoms of depression & anxiety triggering suicidal ideations. During this assessment; Braniyah reports, I came here because I'm so angry & depressed. It has gone on for 2 months that I thought about killing myself. I tried to commit suicide not too long ago, but it did not work. Now, if you will let be, I don't really wanna talk because I'm angry & mad".  Associated Signs/Symptoms:  Depression Symptoms:  depressed mood, insomnia, psychomotor agitation, feelings of worthlessness/guilt, suicidal thoughts without plan,  (Hypo) Manic Symptoms:  Hallucinations, Irritable Mood, Labiality of Mood,  Anxiety Symptoms:  Excessive Worry,  Psychotic Symptoms:  Hallucinations: Auditory  PTSD Symptoms: NA  Total Time spent with patient: 1 hour  Past Psychiatric History: Bipolar disorder  Is the patient at risk to self? No.  Has the patient been a risk to self in the past 6 months? No.  Has the patient been a risk to self within the distant past? Yes.    Is the patient a risk to others? No.  Has the patient been a risk to others in the past 6 months? No.  Has the patient been a risk to others within the distant past? No.   Prior Inpatient Therapy: Yes Prior Outpatient Therapy: Yes  Alcohol Screening: Patient refused Alcohol Screening Tool: Yes 1. How often do you have a drink containing alcohol?: Never 9. Have you or someone else been injured as a result of your  drinking?: No 10. Has a relative or friend or a doctor or another health worker been concerned about your drinking or suggested you cut down?: No Alcohol Use Disorder Identification Test Final Score (AUDIT): 0 Brief Intervention: Yes Substance Abuse History in the last 12 months:  Yes.    Consequences of Substance Abuse: Medical Consequences:  Liver damage, Possible death by overdose Legal Consequences:  Arrests, jail time, Loss of driving privilege. Family Consequences:  Family discord, divorce and or separation.  Previous Psychotropic Medications: Yes   Psychological Evaluations: Yes   Past Medical History:  Past Medical History  Diagnosis Date  . Anxiety   . Hypertension   . Diabetes mellitus without complication ([place])   . Hypercholesteremia   . Hypothyroid   . Nerve damage     both legs    Past Surgical History  Procedure Laterality Date  . Splenectomy Right unknown    per client  . Rotator cuff repair Right unknown    per client, surgery scar noted  . Knee surgery Left unknown    surgical scar noted   Family History: History reviewed. No pertinent family history.  Family Psychiatric  History: Familial hx of depression.  Tobacco Screening: Does not smoke cigarettes  Social History:  History  Alcohol Use No     History  Drug Use No    Additional Social History:  Allergies:  No Known Allergies  Lab Results:  Results for orders placed or performed during the hospital encounter of 04/22/15 (from the past 48 hour(s))  Glucose, capillary  Status: Abnormal   Collection Time: 04/22/15  5:58 AM  Result Value Ref Range   Glucose-Capillary 166 (H) 65 - 99 mg/dL   Blood Alcohol level:  Lab Results  Component Value Date   ETH <5 09/32/3557   Metabolic Disorder Labs:  No results found for: HGBA1C, MPG No results found for: PROLACTIN No results found for: CHOL, TRIG, HDL, CHOLHDL, VLDL, LDLCALC  Current Medications: Current Facility-Administered  Medications  Medication Dose Route Frequency Provider Last Rate Last Dose  . acetaminophen (TYLENOL) tablet 650 mg  650 mg Oral Q6H PRN Laverle Hobby, PA-C      . ALPRAZolam [name]) tablet 1 mg  1 mg Oral TID PRN Laverle Hobby, PA-C   1 mg at 04/22/15 0847  . alum & mag hydroxide-simeth (MAALOX/MYLANTA) 200-200-20 MG/5ML suspension 30 mL  30 mL Oral Q4H PRN Laverle Hobby, PA-C      . amitriptyline (ELAVIL) tablet 25 mg  25 mg Oral QHS Laverle Hobby, PA-C   25 mg at 04/22/15 0300  . aspirin EC tablet 81 mg  81 mg Oral Q1200 Laverle Hobby, PA-C      . levothyroxine (SYNTHROID, LEVOTHROID) tablet 25 mcg  25 mcg Oral QAC breakfast Laverle Hobby, PA-C   25 mcg at 04/22/15 [phone]  . lithium carbonate capsule 300 mg  300 mg Oral BID WC Laverle Hobby, PA-C   300 mg at 04/22/15 0843  . magnesium hydroxide (MILK OF MAGNESIA) suspension 30 mL  30 mL Oral Daily PRN Laverle Hobby, PA-C      . metFORMIN (GLUCOPHAGE) tablet 500 mg  500 mg Oral Q breakfast Laverle Hobby, PA-C   500 mg at 04/22/15 0843  . pantoprazole (PROTONIX) EC tablet 40 mg  40 mg Oral Daily Laverle Hobby, PA-C   40 mg at 04/22/15 0842  . QUEtiapine (SEROQUEL) tablet 400 mg  400 mg Oral QHS Laverle Hobby, PA-C       PTA Medications: Prescriptions prior to admission  Medication Sig Dispense Refill Last Dose  . ALPRAZolam (XANAX) 1 MG tablet Take 1 mg by mouth 3 (three) times daily as needed for anxiety.   04/02/2015  . aspirin EC 81 MG tablet Take 81 mg by mouth daily at 12 noon.   04/10/2015 at Unknown time  . hydrOXYzine (ATARAX/VISTARIL) 25 MG tablet Take 1 tablet (25 mg total) by mouth every 6 (six) hours. 30 tablet 0   . levothyroxine (SYNTHROID, LEVOTHROID) 25 MCG tablet Take 25 mcg by mouth daily before breakfast.   04/11/2015 at Unknown time  . metFORMIN (GLUCOPHAGE) 500 MG tablet Take 500 mg by mouth daily with breakfast.   04/09/2015  . omeprazole (PRILOSEC) 20 MG capsule Take 20 mg by mouth daily.   04/02/2015  .  pregabalin (LYRICA) 100 MG capsule Take 1 capsule (100 mg total) by mouth 3 (three) times daily. 21 capsule 0   . QUEtiapine (SEROQUEL) 400 MG tablet Take 400 mg by mouth at bedtime.   04/10/2015 at Unknown time  . QUEtiapine (SEROQUEL) 50 MG tablet Take 50 mg by mouth daily at 12 noon.   04/10/2015 at Unknown time  . zolpidem (AMBIEN) 10 MG tablet Take 10 mg by mouth at bedtime.   04/04/2015   Musculoskeletal: Strength & Muscle Tone: within normal limits Gait & Station: normal Patient leans: N/A  Psychiatric Specialty Exam: Physical Exam  Constitutional: She is oriented to person, place, and time. She appears well-developed.  HENT:  Head: Normocephalic.  Eyes: Pupils are equal, round, and reactive to light.  Neck: Normal range of motion.  Cardiovascular:  Elevated blood pressure  Respiratory: Effort normal.  Genitourinary:  Denies any issues in this area  Neurological: She is alert and oriented to person, place, and time.  Skin: Skin is warm and dry.  Psychiatric: Her speech is normal. Judgment and thought content normal. Her mood appears anxious. Her affect is angry and blunt. Her affect is not labile and not inappropriate. She is withdrawn and actively hallucinating. Cognition and memory are normal. She exhibits a depressed mood.    Review of Systems  Constitutional: Positive for malaise/fatigue.  HENT: Negative.   Respiratory: Negative.   Cardiovascular:       Hx chest tightness  Gastrointestinal: Negative.   Genitourinary: Negative.   Musculoskeletal: Negative.   Skin: Negative.   Neurological: Positive for weakness.  Endo/Heme/Allergies: Negative.   Psychiatric/Behavioral: Positive for depression, suicidal ideas (dDenis any intent or plans to hurt self) and hallucinations. Negative for memory loss and substance abuse. The patient is nervous/anxious and has insomnia.     Blood pressure 150/111, pulse 111, temperature 98.2 F (36.8 C), temperature source Oral, resp. rate 20,  height 5' 5"  (1.651 m), weight 79.379 kg (175 lb).Body mass index is 29.12 kg/(m^2).  General Appearance: Guarded  Eye Contact::  Minimal  Speech:  clear, not spontaneous  Volume:  Decreased  Mood:  Angry, Anxious and Depressed  Affect:  Restricted  Thought Process:  Coherent  Orientation:  Full (Time, Place, and Person)  Thought Content:  Rumination, auditory hallucinations  Suicidal Thoughts:  Yes, denies any plans or intent  Homicidal Thoughts:  Denies  Memory:  Immediate;   Good Recent;   Fair Remote;   Fair  Judgement:  Fair  Insight:  Fair  Psychomotor Activity:  Decreased  Concentration:  Fair  Recall:  [employer] of Knowledge:Limited  Language: Fair  Akathisia:  Negative  Handed:  Right  AIMS (if indicated):     Assets:  Desire for Improvement  ADL's:  Intact  Cognition: WNL  Sleep:  Number of Hours: 2   Treatment Plan/Recommendations: 1. Admit for crisis management and stabilization, estimated length of stay 3-5 days.  2. Medication management to reduce current symptoms to base line and improve the patient's overall level of functioning; Alprazolam 1 mg for anxiety, Lithium Carbonate 300 mg for mood stabilization, Seroquel 200 mg for mood control,   3. Treat health problems as indicated;  ASA 81 mg for heart health, Metformin 500 mg for DM, Protonix EC 40 mg for GERD, Synthroid 25 mcg for hypothyroidism.  4. Develop treatment plan to decrease risk of relapse upon discharge and the need for readmission.  5. Psycho-social education regarding relapse prevention and self care.  6. Health care follow up as needed for medical problems.  7. Review, reconcile, and reinstate any pertinent home medications for other health issues where appropriate. 8. Call for consults with hospitalist for any additional specialty patient care services as needed.  Observation Level/Precautions:  15 minute checks  Laboratory:  Per ED  Psychotherapy: Group sessions   Medications: Alprazolam 1  mg for anxiety, Lithium Carbonate 300 mg for mood stabilization, Seroquel 200 mg for mood control, ASA 81 mg for heart health, Metformin 500 mg for DM, Protonix EC 40 mg for GERD, Synthroid 25 mcg for hypothyroidism.   Consultations: As needed  Discharge Concerns: Safety, mood stability  Estimated LOS: 5-7 days  Other: Admit to  [id number]   I certify that inpatient services furnished can reasonably be expected to improve the patient's condition.    Encarnacion Slates, NP, PMHNP, FNP-BC 4/13/201711:28 AM I have discussed case with NP and have met with patient  Agree with NP note and assessment  60 year old female, recently relocated from [place], [place] to [place] in order to live with her son. States that this has been a negative experience for her because he is alcoholic and verbally abusive when intoxicated, and that her daughter in law is negative and " nasty".  At this time reports worsening depression, feeling hopeless in her current situation, and describes suicidal ideations, and also intermittent homicidal ideations towards son. She also reports recent auditory hallucinations - name being called but not at present, and currently not presenting with any psychotic symptoms She has a history of Bipolar Disorder, and describes episodes of increased irritability, rate of speech, feeling aggressive . She has had prior psychiatric admissions, most recently about four months ago ( in [place]) for depression. Most recently had been treated with Lithium/Seroquel/Xanax - describes sub-optimal compliance with medications recently. Does state she was taking Xanax prior to admission, but not clear how regularly . Lithium level on admission < 0.06 . UDS Is positive for BZDs  Denies drug or alcohol abuse- states she has been prescribed Xanax, denies abusing .  Medical History remarkable for Hypothyroidism, HTN, DM, and Vitamin D deficiency. Dx- Bipolar Disorder, depressed. Plan - inpatient admission - restart Lithium (  300 mgrs BID) and Seroquel ( 200 mgrs QHS) . Continue Xanax 1 mgrs TID for now, consider gradual taper - hold if sedated .  Will obtain Hospitalist Consult to address HTN , DM management  Obtain routine labs- HgbA1C, Lipid Panel, TSH

## 2015-04-22 NOTE — Consult Note (Signed)
Medical Consultation  Melanie Foster [id number] DOB: [birth year] DOA: 04/22/2015 PCP: No primary care provider on file.   Requesting physician: [place] Date of consultation: 004/13/2017 Reason for consultation: medical management  Impression/Recommendations Diabetes mellitus type 2 -HbA1C -continue metformin for now as there are no anticipated contrasted studies -until HbA1C returns, obtain CBGs to clarify her glycemic control -lipid panel -urine microalbumin  HTN -not on any meds per medication reconciliation -start amlodipine -pt states she was previously on verapamil, but does not remember dose  Hypothyroidism -continue previous home dose  -Check TSH and Free T4 although may not be accurate as synthroid was just restarted -continue home dose synthroid  GERD -continue PPI  Gapped metabolic acidosis -?from metformin -check lactate -recheck BMP  Atypical chest pain -completely reproducible on exam -EKG without concerning ischemic changes  Bipolar 1 disorder, mixed, severe -per primary service      I will followup again tomorrow. Please contact me if I can be of assistance in the meanwhile. Thank you for this consultation.  Chief Complaint: medical management  HPI:  60 year old femalewwith a history of diabetes mellitus, hypertension, hyperlipidemia, and hypothyroidismpresented to the emergency department on 04/21/2015 with suicidal ideations and auditory hallucinations. Psychiatry was consulted, and the patient was admitted to behavioral health Hospital for further management.  The patient recently moved to [place] from [place] to live with her son in [place] one month ago.  She states that he has been off of her medications for at least 1 month. While she was in [place], the patient endorsed compliance with her medications. The patient denies any fevers, chills, shortness of breath, nausea, vomiting, diarrhea, abdominal pain, dysuria, or dysuria. She has  been smoking one half pack to 1 pack per day for the last 30 years. She stated she last used cannabis one month ago, but she denies any further illegal drug use. She states that she has been having chest discomfort on and off even at rest.  Medicine consultation was obtained for medical management.  Labs on 04/21/2015 revealed essentially unremarkable labs except for bicarbonate of 17 to be seen on 4, hemoglobin 13.4, negative chest x-ray. Hepatic panel was unremarkableUrine drug screen was positive for cannabis and benzodiazepines.  Review of Systems:  Constitutional:  No weight loss, night sweats, Fevers, chills, fatigue.  Head&Eyes: No headache.  No vision loss.  No eye pain or scotoma ENT:  No Difficulty swallowing,Tooth/dental problems,Sore throat,  No ear ache, post nasal drip,  Cardio-vascular:  No chest pain, Orthopnea, PND, swelling in lower extremities,  dizziness, palpitations  GI:  No heartburn, indigestion, abdominal pain, nausea, vomiting, diarrhea, loss of appetite, hematochezia, melena Resp:  No shortness of breath with exertion or at rest. No excess mucus, no productive cough, No non-productive cough, No coughing up of blood.No change in color of mucus.No wheezing.No chest wall deformity  Skin:  no rash or lesions.  GU:  no dysuria, change in color of urine, no urgency or frequency. No flank pain.  Musculoskeletal:  No joint pain or swelling. No decreased range of motion. No back pain.  Psych:  No change in mood or affect. No depression or anxiety. Neurologic: No headache, no dysesthesia, no focal weakness, no vision loss. No syncope   Past Medical History  Diagnosis Date  . Anxiety   . Hypertension   . Diabetes mellitus without complication ([place])   . Hypercholesteremia   . Hypothyroid   . Nerve damage     both legs  Past Surgical History  Procedure Laterality Date  . Splenectomy Right unknown    per client  . Rotator cuff repair Right unknown    per  client, surgery scar noted  . Knee surgery Left unknown    surgical scar noted   Social History:  reports that she has been smoking Cigarettes.  She has been smoking about 0.50 packs per day. She does not have any smokeless tobacco history on file. She reports that she does not drink alcohol or use illicit drugs.  History reviewed. No pertinent family history.  No Known Allergies   Prior to Admission medications   Medication Sig Start Date End Date Taking? Authorizing Provider  ALPRAZolam [name]) 1 MG tablet Take 1 mg by mouth 3 (three) times daily as needed for anxiety.    Historical Provider, MD  aspirin EC 81 MG tablet Take 81 mg by mouth daily at 12 noon.    Historical Provider, MD  hydrOXYzine (ATARAX/VISTARIL) 25 MG tablet Take 1 tablet (25 mg total) by mouth every 6 (six) hours. 04/11/15   Deno Etienne, DO  levothyroxine (SYNTHROID, LEVOTHROID) 25 MCG tablet Take 25 mcg by mouth daily before breakfast.    Historical Provider, MD  metFORMIN (GLUCOPHAGE) 500 MG tablet Take 500 mg by mouth daily with breakfast.    Historical Provider, MD  omeprazole (PRILOSEC) 20 MG capsule Take 20 mg by mouth daily.    Historical Provider, MD  pregabalin (LYRICA) 100 MG capsule Take 1 capsule (100 mg total) by mouth 3 (three) times daily. 04/11/15   Deno Etienne, DO  QUEtiapine (SEROQUEL) 400 MG tablet Take 400 mg by mouth at bedtime.    Historical Provider, MD  QUEtiapine (SEROQUEL) 50 MG tablet Take 50 mg by mouth daily at 12 noon.    Historical Provider, MD  zolpidem (AMBIEN) 10 MG tablet Take 10 mg by mouth at bedtime.    Historical Provider, MD    Physical Exam: Filed Vitals:   04/22/15 0200 04/22/15 0205  BP: 138/111 150/111  Pulse: 113 111  Temp: 98.2 F (36.8 C)   TempSrc: Oral   Resp: 20   Height: 5\' 5"  (1.651 m)   Weight: 79.379 kg (175 lb)    General:  A&O x 3, NAD, nontoxic, pleasant/cooperative Head/Eye: No conjunctival hemorrhage, no icterus, [place]/AT, No nystagmus ENT:  No icterus,  No  thrush, good dentition, no pharyngeal exudate Neck:  No masses, no lymphadenpathy, no bruits CV:  RRR, no rub, no gallop, no S3 Lung:  CTAB, good air movement, no wheeze, no rhonchi Abdomen: soft/NT, +BS, nondistended, no peritoneal signs Ext: No cyanosis, No rashes, No petechiae, No lymphangitis, No edema Neuro: CNII-XII intact, strength 4/5 in bilateral upper and lower extremities, no dysmetria  Labs on Admission:  Basic Metabolic Panel:  Recent Labs Lab 04/21/15 1634  NA 141  K 3.8  CL 108  CO2 17*  GLUCOSE 118*  BUN 8  CREATININE 0.67  CALCIUM 9.7   Liver Function Tests:  Recent Labs Lab 04/21/15 1634  AST 22  ALT 17  ALKPHOS 77  BILITOT 0.6  PROT 7.2  ALBUMIN 3.4*   No results for input(s): LIPASE, AMYLASE in the last 168 hours. No results for input(s): AMMONIA in the last 168 hours. CBC:  Recent Labs Lab 04/21/15 1634  WBC 9.4  HGB 13.4  HCT 41.8  MCV 87.6  PLT 385   Cardiac Enzymes: No results for input(s): CKTOTAL, CKMB, CKMBINDEX, TROPONINI in the last 168 hours. BNP: Invalid  input(s): POCBNP CBG:  Recent Labs Lab 04/22/15 0558  GLUCAP 166*    Radiological Exams on Admission: Dg Chest 2 View  04/21/2015  CLINICAL DATA:  Anxiety.  Hypertension and diabetes. EXAM: CHEST  2 VIEW COMPARISON:  None. FINDINGS: The heart size and mediastinal contours are within normal limits. Both lungs are clear. The visualized skeletal structures are unremarkable. IMPRESSION: No active cardiopulmonary disease. Electronically Signed   By: Kerby Moors M.D.   On: 04/21/2015 16:43    EKG: Independently reviewed. Inus rhythm, nonspecific T-wave changes  Time spent: 60 min  Waver Dibiasio [facility] Pager [phone]  If 7PM-7AM, please contact night-coverage [email or web address] Password [facility] 04/22/2015, 3:49 PM

## 2015-04-22 NOTE — BHH Suicide Risk Assessment (Addendum)
[facility] Admission Suicide Risk Assessment   Nursing information obtained from:  Patient Demographic factors:  Unemployed Current Mental Status:  Self-harm thoughts Loss Factors:  Decline in physical health, Financial problems / change in socioeconomic status Historical Factors:  NA Risk Reduction Factors:  Sense of responsibility to family, Living with another person, especially a relative  Total Time spent with patient: 45 minutes Principal Problem: Bipolar Disorder, Depressed  Diagnosis:   Patient Active Problem List   Diagnosis Date Noted  . Bipolar 1 disorder, mixed, severe ([place]) [F31.63] 04/22/2015     Continued Clinical Symptoms:  Alcohol Use Disorder Identification Test Final Score (AUDIT): 0 The "Alcohol Use Disorders Identification Test", Guidelines for Use in Primary Care, Second Edition.  World Pharmacologist [facility]). Score between 0-7:  no or low risk or alcohol related problems. Score between 8-15:  moderate risk of alcohol related problems. Score between 16-19:  high risk of alcohol related problems. Score 20 or above:  warrants further diagnostic evaluation for alcohol dependence and treatment.   CLINICAL FACTORS:  60 year old female, recently relocated from [place], [place] to [place] in order to live with her son. States that this has been a negative experience for her because he is alcoholic and verbally abusive when intoxicated, and that her daughter in law is negative and " nasty".  At this time reports worsening depression, feeling hopeless in her current situation, and describes suicidal ideations, and also intermittent homicidal ideations towards son. She also reports recent auditory hallucinations - name being called but not at present, and currently not presenting with any psychotic symptoms She has a history of Bipolar Disorder, and describes episodes of increased irritability, rate of speech, feeling aggressive . She has had prior psychiatric admissions, most recently  about four months ago ( in [place]) for depression. Most recently had been treated with Lithium/Seroquel/Xanax - describes sub-optimal compliance with medications recently. Does state she was taking Xanax prior to admission, but not clear how regularly .  Lithium level on admission < 0.06 . UDS  Is positive for BZDs  Denies drug or alcohol abuse- states she has been prescribed Xanax, denies abusing .  Medical History remarkable for Hypothyroidism, HTN, DM, and Vitamin D deficiency. Dx- Bipolar Disorder, depressed. Plan - inpatient admission - restart Lithium ( 300 mgrs BID)  and Seroquel ( 200 mgrs QHS) . Continue Xanax 1 mgrs TID for now, consider gradual taper  - hold if sedated .  Will obtain Hospitalist Consult to address HTN , DM management  Obtain routine labs- HgbA1C, Lipid Panel, TSH   Musculoskeletal: Strength & Muscle Tone: within normal limits Gait & Station: normal Patient leans: N/A  Psychiatric Specialty Exam: ROS  Blood pressure 150/111, pulse 111, temperature 98.2 F (36.8 C), temperature source Oral, resp. rate 20, height 5\' 5"  (1.651 m), weight 175 lb (79.379 kg).Body mass index is 29.12 kg/(m^2).  General Appearance: Fairly Groomed  Engineer, water::  Fair  Speech:  Normal Rate  Volume:  Normal  Mood:  Depressed  Affect:  Constricted and Tearful  Thought Process:  Linear  Orientation:  Full (Time, Place, and Person)  Thought Content:  denies hallucinations, no delusions expressed , does not appear internally preoccupied   Suicidal Thoughts:  Yes- no current plan or intention and contracts for safety on the unit  Homicidal Thoughts:  No- denies homicidal ideations at this time, states she has intermittent thoughts of killing her son when he becomes abusive , but denies at this time  Memory:  recent and remote grossly intact   Judgement:  Fair  Insight:  Fair  Psychomotor Activity:  Decreased  Concentration:  Good  Recall:  Good  Fund of Knowledge:Good  Language: Good   Akathisia:  Negative  Handed:  Right  AIMS (if indicated):     Assets:  Desire for Improvement Resilience  Sleep:  Number of Hours: 2  Cognition: WNL  ADL's:  Intact    COGNITIVE FEATURES THAT CONTRIBUTE TO RISK:  Closed-mindedness and Loss of executive function    SUICIDE RISK:   Moderate:  Frequent suicidal ideation with limited intensity, and duration, some specificity in terms of plans, no associated intent, good self-control, limited dysphoria/symptomatology, some risk factors present, and identifiable protective factors, including available and accessible social support.  PLAN OF CARE: Patient will be admitted to inpatient psychiatric unit for stabilization and safety. Will provide and encourage milieu participation. Provide medication management and maked adjustments as needed.  Will follow daily.    I certify that inpatient services furnished can reasonably be expected to improve the patient's condition.   Neita Garnet, MD 04/22/2015, 2:40 PM

## 2015-04-23 DIAGNOSIS — I10 Essential (primary) hypertension: Secondary | ICD-10-CM

## 2015-04-23 DIAGNOSIS — F3130 Bipolar disorder, current episode depressed, mild or moderate severity, unspecified: Secondary | ICD-10-CM

## 2015-04-23 DIAGNOSIS — E1165 Type 2 diabetes mellitus with hyperglycemia: Secondary | ICD-10-CM

## 2015-04-23 DIAGNOSIS — E038 Other specified hypothyroidism: Secondary | ICD-10-CM

## 2015-04-23 LAB — BASIC METABOLIC PANEL
ANION GAP: 10 (ref 5–15)
BUN: 10 mg/dL (ref 6–20)
CHLORIDE: 106 mmol/L (ref 101–111)
CO2: 23 mmol/L (ref 22–32)
Calcium: 10.1 mg/dL (ref 8.9–10.3)
Creatinine, Ser: 0.69 mg/dL (ref 0.44–1.00)
GFR calc Af Amer: >60 mL/min (ref >60)
GFR calc non Af Amer: >60 mL/min (ref >60)
GLUCOSE: 133 mg/dL — AB (ref 65–99)
POTASSIUM: 4.2 mmol/L (ref 3.5–5.1)
Sodium: 139 mmol/L (ref 135–145)

## 2015-04-23 LAB — LIPID PANEL
CHOLESTEROL: 234 mg/dL — AB (ref 0–200)
HDL: 54 mg/dL (ref >40)
LDL Cholesterol: 133 mg/dL — ABNORMAL HIGH (ref 0–99)
TRIGLYCERIDES: 237 mg/dL — AB (ref <150)
Total CHOL/HDL Ratio: 4.3 RATIO
VLDL: 47 mg/dL — ABNORMAL HIGH (ref 0–40)

## 2015-04-23 LAB — GLUCOSE, CAPILLARY
GLUCOSE-CAPILLARY: 114 mg/dL — AB (ref 65–99)
GLUCOSE-CAPILLARY: 146 mg/dL — AB (ref 65–99)
Glucose-Capillary: 133 mg/dL — ABNORMAL HIGH (ref 65–99)

## 2015-04-23 LAB — LACTIC ACID, PLASMA: Lactic Acid, Venous: 1.4 mmol/L (ref 0.5–2.0)

## 2015-04-23 LAB — T4, FREE: FREE T4: 0.61 ng/dL (ref 0.61–1.12)

## 2015-04-23 LAB — TSH: TSH: 0.654 u[IU]/mL (ref 0.350–4.500)

## 2015-04-23 MED ORDER — QUETIAPINE FUMARATE 300 MG PO TABS
300.0000 mg | ORAL_TABLET | Freq: Every day | ORAL | Status: DC
  Administered 2015-04-23 – 2015-04-24 (×2): 300 mg via ORAL
  Filled 2015-04-23 (×3): qty 1
  Filled 2015-04-23: qty 3
  Filled 2015-04-23: qty 1

## 2015-04-23 MED ORDER — DIPHENHYDRAMINE-ZINC ACETATE 2-0.1 % EX CREA
TOPICAL_CREAM | Freq: Two times a day (BID) | CUTANEOUS | Status: DC | PRN
  Filled 2015-04-23: qty 28

## 2015-04-23 NOTE — BHH Group Notes (Signed)
[place] LCSW Group Therapy  04/23/2015 1:59 PM  Type of Therapy:  Group Therapy  Participation Level:  Minimal  Participation Quality:  Appropriate and Attentive  Affect:  Depressed  Cognitive:  Appropriate  Insight:  Developing/Improving  Engagement in Therapy:  Developing/Improving  Modes of Intervention:  Discussion, Exploration and Support  Summary of Progress/Problems:  Chaplain led group explored concept of hope and its relevance to mental health recovery. Patients explored themes including what matters to them personally, how others responses are similar/different, and what they are hopeful for. Group members discussed relevance of social supports, innter strength and using their own stories to craft a recovery path. Patient identified feeling hopeless due to "having to make some hard decisions" regarding living situation.  Feels she needs to leave son's home due to his substance use, feels this will be painful process.  Feels lack of hope re hospitalization, says that she "needs to just get on with it."  Hope would come from setting boundaries and not seek help from family members.   Beverely Pace 04/23/2015, 1:59 PM

## 2015-04-23 NOTE — Progress Notes (Signed)
DAR NOTE: Patient remained angry, irritable and verbally aggressive towards staff.  Requesting and demanding to be discharged from the hospital.  Denies pain, auditory and visual hallucinations.  Rates depression at 9, hopelessness at 9, and anxiety at 9.  Maintained on routine safety checks.  Medications given as prescribed.  Support and encouragement offered as needed.  Attended group and participated.  States goal for today is "to get out of here."  Minimal interaction with peers.

## 2015-04-23 NOTE — Progress Notes (Signed)
Patient Demographics  Melanie Foster, is a 59 y.o. female, DOB - 09-Nov-1955, [id number]  Admit date - 04/22/2015   Admitting Physician Jenne Campus, MD  Outpatient Primary MD for the patient is No primary care provider on file.  LOS - 1   No chief complaint on file.       Subjective:   Melanie Foster today has, No headache, No chest pain, No abdominal pain - No Nausea, No Cough - SOB.  Assessment & Plan    Principal Problem:   Bipolar I disorder, most recent episode depressed ([place]) Active Problems:   Bipolar 1 disorder, mixed, severe ([place])   Hyperlipidemia   Essential hypertension   Type 2 diabetes mellitus with hyperglycemia ([place])   Metabolic acidosis   Hypothyroidism  Diabetes mellitus type 2 - Continue with metformin, CABG controlled on insulin sliding scale, follow on hemoglobin A1c - LDL is 133, will discuss with patient dietary and lifestyle modification, and is to be followed as an outpatient.  HTN -pt states she was previously on verapamil, but does not remember dose, BP controlled  on amlodipine.  Hypothyroidism -continue previous home dose synthroid.  GERD -continue PPI  Atypical chest pain -completely reproducible on exam -EKG without concerning ischemic changes  Bipolar 1 disorder, mixed, severe -per primary service    Medications  Scheduled Meds: . ALPRAZolam  1 mg Oral TID  . amLODipine  5 mg Oral Daily  . aspirin EC  81 mg Oral Q1200  . insulin aspart  0-15 Units Subcutaneous TID WC  . insulin aspart  4 Units Subcutaneous TID WC  . levothyroxine  25 mcg Oral QAC breakfast  . lithium carbonate  300 mg Oral BID WC  . metFORMIN  500 mg Oral Q breakfast  . pantoprazole  40 mg Oral Daily  . QUEtiapine  100 mg Oral Once  . QUEtiapine  200 mg Oral QHS   Continuous Infusions:  PRN Meds:.acetaminophen, alum & mag hydroxide-simeth, diphenhydrAMINE-zinc acetate,  magnesium hydroxide   Lab Results  Component Value Date   PLT 385 04/21/2015    Antibiotics    Anti-infectives    None          Objective:   Filed Vitals:   04/22/15 0205 04/22/15 2354 04/23/15 0612 04/23/15 0613  BP: 150/111 117/69 126/81 110/93  Pulse: 111 88 80 101  Temp:   97.5 F (36.4 C)   TempSrc:   Oral   Resp:  19 18   Height:      Weight:        Wt Readings from Last 3 Encounters:  04/22/15 79.379 kg (175 lb)  04/11/15 78.291 kg (172 lb 9.6 oz)    No intake or output data in the 24 hours ending 04/23/15 1535   Physical Exam  Awake Alert, Oriented X 3, [place].AT,PERRAL Supple Neck,No JVD, No cervical lymphadenopathy appriciated.  Symmetrical Chest wall movement, Good air movement bilaterally, CTAB RRR,No Gallops,Rubs or new Murmurs, No Parasternal Heave +ve B.Sounds, Abd Soft, No tenderness, No organomegaly appriciated, No rebound - guarding or rigidity. No Cyanosis, Clubbing or edema, No new Rash or bruise     Data Review   Micro Results No results found for this or any previous visit (from the  past 240 hour(s)).  Radiology Reports Dg Chest 2 View  04/21/2015  CLINICAL DATA:  Anxiety.  Hypertension and diabetes. EXAM: CHEST  2 VIEW COMPARISON:  None. FINDINGS: The heart size and mediastinal contours are within normal limits. Both lungs are clear. The visualized skeletal structures are unremarkable. IMPRESSION: No active cardiopulmonary disease. Electronically Signed   By: Kerby Moors M.D.   On: 04/21/2015 16:43     CBC  Recent Labs Lab 04/21/15 1634  WBC 9.4  HGB 13.4  HCT 41.8  PLT 385  MCV 87.6  MCH 28.1  MCHC 32.1  RDW 14.7    Chemistries   Recent Labs Lab 04/21/15 1634 04/23/15 0632  NA 141 139  K 3.8 4.2  CL 108 106  CO2 17* 23  GLUCOSE 118* 133*  BUN 8 10  CREATININE 0.67 0.69  CALCIUM 9.7 10.1  AST 22  --   ALT 17  --   ALKPHOS 77  --   BILITOT 0.6  --     ------------------------------------------------------------------------------------------------------------------ estimated creatinine clearance is 78.9 mL/min (by C-G formula based on Cr of 0.69). ------------------------------------------------------------------------------------------------------------------ No results for input(s): HGBA1C in the last 72 hours. ------------------------------------------------------------------------------------------------------------------  Recent Labs  04/23/15 0632  CHOL 234*  HDL 54  LDLCALC 133*  TRIG 237*  CHOLHDL 4.3   ------------------------------------------------------------------------------------------------------------------ No results for input(s): TSH, T4TOTAL, T3FREE, THYROIDAB in the last 72 hours.  Invalid input(s): FREET3 ------------------------------------------------------------------------------------------------------------------ No results for input(s): VITAMINB12, FOLATE, FERRITIN, TIBC, IRON, RETICCTPCT in the last 72 hours.  Coagulation profile No results for input(s): INR, PROTIME in the last 168 hours.  No results for input(s): DDIMER in the last 72 hours.  Cardiac Enzymes No results for input(s): CKMB, TROPONINI, MYOGLOBIN in the last 168 hours.  Invalid input(s): CK ------------------------------------------------------------------------------------------------------------------ Invalid input(s): POCBNP     Time Spent in minutes   20 minutes   Melanie Foster M.D on 04/23/2015 at 3:35 PM  Between 7am to 7pm - Pager - [phone]  After 7pm go to [email or web address] - password [facility]  [facility]   Office  [phone]

## 2015-04-23 NOTE — B[facility] Group Notes (Signed)
[place] LCSW Group Therapy  04/23/2015 1:05 PM  Type of Therapy:  Group Therapy  Participation Level:  Did Not Attend, Invited  Summary of Progress/Problems: Chaplain led group explored concept of hope and its relevance to mental health recovery. Patients explored themes including what matters to them personally, how others responses are similar/different, and what they are hopeful for. Group members discussed relevance of social supports, innter strength and using their own stories to craft a recovery path.   Edwyna Shell, LCSW Lead Clinical Social Worker Phone: [phone]   Melanie Foster 04/23/2015, 1:05 PM

## 2015-04-23 NOTE — Progress Notes (Signed)
D: Pt endorses of severe anxiety and severe depression; states, "my anxiety and depression are both at a 10." Pt also endorses auditory hallucination however, denies command auditory hallucinations; states, "I hear people calling my name and having conversations but like someone asking me to kill myself or nothing like that." Pt is very needy. Pt complained of headache-rated pain at a 6. A: Medications offered as prescribed.  Support, encouragement, and safe environment provided.  15-minute safety checks continue. R: Pt was compliant with schedule medication; refused Tylenol 650mg  for headache.  Pt attended group. Safety checks continue

## 2015-04-23 NOTE — Progress Notes (Signed)
Adult Psychoeducational Group Note  Date:  04/23/2015 Time:  8:32 PM  Group Topic/Focus:  Wrap-Up Group:   The focus of this group is to help patients review their daily goal of treatment and discuss progress on daily workbooks.  Participation Level:  Active  Participation Quality:  Appropriate  Affect:  Appropriate  Cognitive:  Appropriate  Insight: Appropriate  Engagement in Group:  Engaged  Modes of Intervention:  Discussion  Additional Comments: The patient attended group.The patient also said that she had a great day. [place], [place] 04/23/2015, 8:32 PM

## 2015-04-23 NOTE — BHH Group Notes (Signed)
[facility] LCSW Aftercare Discharge Planning Group Note   04/23/2015 10:53 AM  Participation Quality:  Engaged  Mood/Affect:  Tearful  Depression Rating:    Anxiety Rating:    Thoughts of Suicide:  No Will you contract for safety?   NA  Current AVH:  No  Plan for Discharge/Comments:  Beating herself up for "stupid" decisions.  Talked at length about her challenges with family [daughter, son-in-law and grandson] in [place], which was the reason to relocate here, and how this has been stressful due to son's drinking and lashing out at others.  Frustrated because she has no money to move out on her own as she paid bills for son this month.  Does not want to live in ALF nor boarding house nor extended stay hotel.  "They are just drug havens."  No income until her check comes in May.  "Other people owe me money, but they are avoiding me now." Tearful several times throughout.  Transportation Means:   Supports:  Roque Lias B

## 2015-04-23 NOTE — Progress Notes (Signed)
[facility] MD Progress Note  04/23/2015 5:22 PM Melanie Foster  MRN:  [id number] Subjective:   Patient states she is feeling upset because of medications not being " what I was taking at home", and is hoping to discharge soon. Objective : I have discussed case with treatment team and have met with patient . Report is that she presents with an anxious affect, demanding/ monopolizing  at times, but redirectable.  At this time reports feeling " better", and focused on being discharged soon. States " I really do not think I belong here". States she plans to return to her son's home on discharge. Denies medication side effects. Focuses on medication doses, particularly Xanax, stating that she has been prescribed up to 6 mgrs a day in the past and wanting dose increased . Also states that she had been taking Seroquel at 400 mgrs QHS without side effects and feels current dose not adequately addressing her symptoms or sleep. Of note, no current symptoms of withdrawal- no tremors, no diaphoresis, no psychomotor restlessness or agitation. Today denies any active suicidal ideations, denies any homicidal ideations . Specifically denies any current homicidal or violent ideations towards her son or daughter in law   Principal Problem: Bipolar I disorder, most recent episode depressed ([place]) Diagnosis:   Patient Active Problem List   Diagnosis Date Noted  . Bipolar 1 disorder, mixed, severe ([place]) [F31.63] 04/22/2015  . Hyperlipidemia [E78.5] 04/22/2015  . Essential hypertension [I10] 04/22/2015  . Type 2 diabetes mellitus with hyperglycemia ([place]) [E11.65] 04/22/2015  . Metabolic acidosis [X32.4] 04/22/2015  . Hypothyroidism [E03.9] 04/22/2015  . Bipolar I disorder, most recent episode depressed ([place]) [F31.30]    Total Time spent with patient: 20 minutes     Past Medical History:  Past Medical History  Diagnosis Date  . Anxiety   . Hypertension   . Diabetes mellitus without complication ([place])   .  Hypercholesteremia   . Hypothyroid   . Nerve damage     both legs    Past Surgical History  Procedure Laterality Date  . Splenectomy Right unknown    per client  . Rotator cuff repair Right unknown    per client, surgery scar noted  . Knee surgery Left unknown    surgical scar noted   Family History: History reviewed. No pertinent family history.  Social History:  History  Alcohol Use No     History  Drug Use No    Social History   Social History  . Marital Status: Single    Spouse Name: N/A  . Number of Children: N/A  . Years of Education: N/A   Social History Main Topics  . Smoking status: Current Every Day Smoker -- 0.50 packs/day    Types: Cigarettes  . Smokeless tobacco: None  . Alcohol Use: No  . Drug Use: No  . Sexual Activity: Not Currently   Other Topics Concern  . None   Social History Narrative   Additional Social History:   Sleep: Good  Appetite:  Fair  Current Medications: Current Facility-Administered Medications  Medication Dose Route Frequency Provider Last Rate Last Dose  . acetaminophen (TYLENOL) tablet 650 mg  650 mg Oral Q6H PRN Laverle Hobby, PA-C      . ALPRAZolam [name]) tablet 1 mg  1 mg Oral TID Jenne Campus, MD   1 mg at 04/23/15 1710  . alum & mag hydroxide-simeth (MAALOX/MYLANTA) 200-200-20 MG/5ML suspension 30 mL  30 mL Oral Q4H PRN Laverle Hobby, PA-C      .  amLODipine (NORVASC) tablet 5 mg  5 mg Oral Daily Catarina Hartshorn, MD   5 mg at 04/23/15 1008  . aspirin EC tablet 81 mg  81 mg Oral Q1200 Kerry Hough, PA-C   81 mg at 04/23/15 1146  . diphenhydrAMINE-zinc acetate (BENADRYL) 2-0.1 % cream   Topical BID PRN Sanjuana Kava, NP      . insulin aspart (novoLOG) injection 0-15 Units  0-15 Units Subcutaneous TID WC Sanjuana Kava, NP   0 Units at 04/22/15 1714  . insulin aspart (novoLOG) injection 4 Units  4 Units Subcutaneous TID WC Sanjuana Kava, NP   4 Units at 04/22/15 1716  . levothyroxine (SYNTHROID, LEVOTHROID) tablet  25 mcg  25 mcg Oral QAC breakfast Kerry Hough, PA-C   25 mcg at 04/23/15 3893  . lithium carbonate capsule 300 mg  300 mg Oral BID WC Kerry Hough, PA-C   300 mg at 04/23/15 1710  . magnesium hydroxide (MILK OF MAGNESIA) suspension 30 mL  30 mL Oral Daily PRN Kerry Hough, PA-C      . metFORMIN (GLUCOPHAGE) tablet 500 mg  500 mg Oral Q breakfast Kerry Hough, PA-C   500 mg at 04/23/15 1008  . pantoprazole (PROTONIX) EC tablet 40 mg  40 mg Oral Daily Kerry Hough, PA-C   40 mg at 04/23/15 1009  . QUEtiapine (SEROQUEL) tablet 100 mg  100 mg Oral Once Craige Cotta, MD   100 mg at 04/22/15 2245  . QUEtiapine (SEROQUEL) tablet 200 mg  200 mg Oral QHS Craige Cotta, MD   200 mg at 04/22/15 2123    Lab Results:  Results for orders placed or performed during the hospital encounter of 04/22/15 (from the past 48 hour(s))  Glucose, capillary     Status: Abnormal   Collection Time: 04/22/15  5:58 AM  Result Value Ref Range   Glucose-Capillary 166 (H) 65 - 99 mg/dL  Glucose, capillary     Status: Abnormal   Collection Time: 04/22/15  5:13 PM  Result Value Ref Range   Glucose-Capillary 120 (H) 65 - 99 mg/dL  Glucose, capillary     Status: Abnormal   Collection Time: 04/22/15  9:07 PM  Result Value Ref Range   Glucose-Capillary 126 (H) 65 - 99 mg/dL  Glucose, capillary     Status: Abnormal   Collection Time: 04/23/15  6:20 AM  Result Value Ref Range   Glucose-Capillary 133 (H) 65 - 99 mg/dL  Lipid panel, fasting     Status: Abnormal   Collection Time: 04/23/15  6:32 AM  Result Value Ref Range   Cholesterol 234 (H) 0 - 200 mg/dL   Triglycerides 734 (H) <150 mg/dL   HDL 54 >28 mg/dL   Total CHOL/HDL Ratio 4.3 RATIO   VLDL 47 (H) 0 - 40 mg/dL   LDL Cholesterol 768 (H) 0 - 99 mg/dL    Comment:        Total Cholesterol/HDL:CHD Risk Coronary Heart Disease Risk Table                     Men   Women  1/2 Average Risk   3.4   3.3  Average Risk       5.0   4.4  2 X Average  Risk   9.6   7.1  3 X Average Risk  23.4   11.0        Use the calculated Patient Ratio  above and the CHD Risk Table to determine the patient's CHD Risk.        ATP III CLASSIFICATION (LDL):  <100     mg/dL   Optimal  100-129  mg/dL   Near or Above                    Optimal  130-159  mg/dL   Borderline  160-189  mg/dL   High  >190     mg/dL   Very High Performed at [facility] metabolic panel     Status: Abnormal   Collection Time: 04/23/15  6:32 AM  Result Value Ref Range   Sodium 139 135 - 145 mmol/L   Potassium 4.2 3.5 - 5.1 mmol/L   Chloride 106 101 - 111 mmol/L   CO2 23 22 - 32 mmol/L   Glucose, Bld 133 (H) 65 - 99 mg/dL   BUN 10 6 - 20 mg/dL   Creatinine, Ser 0.69 0.44 - 1.00 mg/dL   Calcium 10.1 8.9 - 10.3 mg/dL   GFR calc non Af Amer >60 >60 mL/min   GFR calc Af Amer >60 >60 mL/min    Comment: (NOTE) The eGFR has been calculated using the CKD EPI equation. This calculation has not been validated in all clinical situations. eGFR's persistently <60 mL/min signify possible Chronic Kidney Disease.    Anion gap 10 5 - 15    Comment: Performed at [facility]  Lactic acid, plasma     Status: None   Collection Time: 04/23/15  6:32 AM  Result Value Ref Range   Lactic Acid, Venous 1.4 0.5 - 2.0 mmol/L    Comment: Performed at [facility]  Glucose, capillary     Status: Abnormal   Collection Time: 04/23/15 11:49 AM  Result Value Ref Range   Glucose-Capillary 114 (H) 65 - 99 mg/dL    Blood Alcohol level:  Lab Results  Component Value Date   ETH <5 04/21/2015    Physical Findings: AIMS: Facial and Oral Movements Muscles of Facial Expression: None, normal Lips and Perioral Area: None, normal Jaw: None, normal Tongue: None, normal,Extremity Movements Upper (arms, wrists, hands, fingers): None, normal Lower (legs, knees, ankles, toes): None, normal, Trunk Movements Neck, shoulders, hips: None, normal, Overall  Severity Severity of abnormal movements (highest score from questions above): None, normal Incapacitation due to abnormal movements: None, normal Patient's awareness of abnormal movements (rate only patient's report): No Awareness, Dental Status Current problems with teeth and/or dentures?: Yes Does patient usually wear dentures?: Yes  CIWA:    COWS:     Musculoskeletal: Strength & Muscle Tone: within normal limits Gait & Station: normal Patient leans: N/A  Psychiatric Specialty Exam: ROS no chest pain, no shortness of breath  Blood pressure 110/93, pulse 101, temperature 97.5 F (36.4 C), temperature source Oral, resp. rate 18, height '5\' 5"'$  (1.651 m), weight 175 lb (79.379 kg).Body mass index is 29.12 kg/(m^2).  General Appearance: Fairly Groomed  Engineer, water::  Good  Speech:  Normal Rate  Volume:  Normal  Mood:  Depressed and Irritable  Affect:  less constricted today, not tearful, vaguely irritable   Thought Process:  Linear  Orientation:  Other:  fully alert and attentive n  Thought Content:  denies hallucinations at this time, states she has heard voice call her name , at this time no delusions expressed, not internally preoccupied   Suicidal Thoughts:  No- no current suicidal or  self injurious ideations , contracts for safety on unit   Homicidal Thoughts:  No- denies any homicidal ideations , as noted currently denies any homicidal ideations towards son or daughter in law   Memory:  recent and remote grossly intact   Judgement:  Fair  Insight:  Fair  Psychomotor Activity:  Normal  Concentration:  Good  Recall:  Good  Fund of Knowledge:Good  Language: Good  Akathisia:  Negative  Handed:  Right  AIMS (if indicated):     Assets:  Desire for Improvement Resilience  ADL's:  Intact  Cognition: WNL  Sleep:  Number of Hours: 6.75  Assessment -patient remains depressed, anxious, but presents improved compared to admission, with more reactive affect, and at this time not  endorsing SI or HI. At times irritable, demanding, particularly focusing on xanax dose, which she states she has been on for " a long time", and often at higher doses. Of note, not currently presenting with any withdrawal symptoms and vitals are stable . Treatment Plan Summary: Daily contact with patient to assess and evaluate symptoms and progress in treatment, Medication management, Plan inpatient admission  and medications as below  Encourage ongoing group , milieu participation  Continue Xanax 1 mgr TID for anxiety  Increase Seroquel to 300 mgrs QHS for mood disorder Continue Lithium 300 mgrs BID for mood disorder  Continue management for Diabetes and Hypothyroidism - appreciate hospitalist follow up to help with medical management  Treatment team working on disposition plans  Neita Garnet, MD 04/23/2015, 5:22 PM

## 2015-04-24 LAB — PROLACTIN: Prolactin: 20.4 ng/mL (ref 4.8–23.3)

## 2015-04-24 LAB — GLUCOSE, CAPILLARY
GLUCOSE-CAPILLARY: 129 mg/dL — AB (ref 65–99)
Glucose-Capillary: 160 mg/dL — ABNORMAL HIGH (ref 65–99)
Glucose-Capillary: 164 mg/dL — ABNORMAL HIGH (ref 65–99)
Glucose-Capillary: 174 mg/dL — ABNORMAL HIGH (ref 65–99)

## 2015-04-24 LAB — HEMOGLOBIN A1C
HEMOGLOBIN A1C: 6.6 % — AB (ref 4.8–5.6)
MEAN PLASMA GLUCOSE: 143 mg/dL

## 2015-04-24 MED ORDER — NICOTINE 21 MG/24HR TD PT24
21.0000 mg | MEDICATED_PATCH | Freq: Every day | TRANSDERMAL | Status: DC
  Administered 2015-04-24 – 2015-04-25 (×2): 21 mg via TRANSDERMAL
  Filled 2015-04-24 (×3): qty 1

## 2015-04-24 NOTE — Progress Notes (Signed)
D   Pt has been loud cursing and intrusive toward staff and other patients   She is difficult to redirect and does better with distraction    Pt takes her medication and requests more especially her xanax    A   Verbal support given   Medications administered and effectiveness monitored    Q 15 min checks R   Pt safe at present

## 2015-04-24 NOTE — Progress Notes (Signed)
D: Pt continues to be very needy and very unappreciative; complained about everything and everybody-from "the doctor is not giving all the meds I take; he only increase by Seroquel by 100" to "I finally was able to get my numbers from my phone today but I lost the numbers; they say I can't go back to my locker, why can't I go back to my things? They are mine." Pt endorses of severe anxiety and severe depression; states, "you guys are not giving what I need, you are making my depression and anxiety worse." Pt however, denies pain, SI, HI or AVH.  A: Medications offered as prescribed.  Support, encouragement, and safe environment provided.  15-minute safety checks continue. R: Pt was med compliant.  Pt attended group. Safety checks continue.

## 2015-04-24 NOTE — [facility] Group Notes (Signed)
[facility] Group Notes:  (Nursing/MHT/Case Management/Adjunct)  Date:  04/24/2015  Time:  1:33 PM  Type of Therapy:  Nurse Education  Participation Level:  Active  Participation Quality:  Appropriate and Attentive  Affect:  Appropriate  Cognitive:  Alert and Appropriate  Insight:  Appropriate and Good  Engagement in Group:  Engaged and Improving  Modes of Intervention:  Activity, Discussion and Education  Summary of Progress/Problems: Topic was on healthy coping skills. Discussed the importance of learning and maintaining new coping skills that leads to a healthy lifestyle.  Patient was attentive and receptive.       Mart Piggs 04/24/2015, 1:33 PM

## 2015-04-24 NOTE — Progress Notes (Signed)
DAR NOTE: Patient continues to show aggressive behaviors towards staff and other patient.  Patient very intrusive, labile and loud on the unit even after several redirection.  Patient continues to refuse sliding scale insulin and coverage.  Denies pain, auditory and visual hallucinations.  Rates depression at 0, hopelessness at 0, and anxiety at 0.  Maintained on routine safety checks.  Medications given as prescribed.  Support and encouragement offered as needed.  Attended group and participated.  States goal for today is "going home."  Patient observed socializing with peers in the dayroom.

## 2015-04-24 NOTE — [facility] Group Notes (Signed)
[facility] Group Notes:  (Clinical Social Work)  04/24/2015  11:15-12:00PM  Summary of Progress/Problems:   Today's process group involved patients discussing their feelings related to being hospitalized, as well as how they can use their present feelings to create a plan for how to avoid future hospitalizations. The patient expressed her primary feeling about being hospitalized is "anger" because she has no suicidal ideation and needs to go home because her grandchildren are there and she wants to see them.  She said she has been coming to facilities such as this one for 22 years and has never been so mistreated.  She attributed her Involuntary Commitment to cursing in the Emergency Room, and was livid that she was IVC'd for cursing when she attempted suicide in February and was not hospitalized at all.  She was in and out of the room, and while there was monopolizing, negative, and intrusive.  She became irritated with CSW several times, and at one point left the room threatening to hurt herself, i.e., "break my damn neck."  Type of Therapy:  Group Therapy - Process  Participation Level:  Active  Participation Quality:  Monopolizing and Resistant  Affect:  Irritable  Cognitive:  Alert  Insight:  Limited  Engagement in Therapy:  Limited  Modes of Intervention:  Exploration, Discussion  Selmer Dominion, LCSW 04/24/2015, 1:30 PM

## 2015-04-24 NOTE — Progress Notes (Signed)
Patient ID: Melanie Foster, female   DOB: [birth year], 60 y.o.   MRN: [id number] [facility] MD Progress Note  04/24/2015 12:33 PM Melanie Foster  MRN:  [id number] Subjective:   Patient  Reports she is upset because by being in the hospital she is missing  Seeing her grandchildren, who have come from out of state to visit her and her son. Denies medication side effects. Objective : I have discussed case with treatment team and have met with patient .  Reports is that patient has remained intermittently irritable, demanding, and focused on being discharged soon. At this time, however, presents improved compared to admission. States her depression is better and states " I realize that living with my son is not that bad after all, we are family and we support each other, and I feel safer than I did in [place]". States she is feeling irritable " only because I am missing my family and seeing my grandchildren, and because I am craving for a cigarette" ( she is on nicotine patch) At this time denies any suicidal ideations, denies any homicidal ideations, and states " I have no thoughts of hurting my son, I love him, I just wish he would not drink". Denies medication side effects , states she feels she would need a higher dose of Xanax " from my anxiety to go away", but more receptive to review of addictive potential of BZDs, and concern about increasing side effect profile with higher doses . Of note, no current symptoms of withdrawal- no tremors, no diaphoresis, no psychomotor restlessness   Principal Problem: Bipolar I disorder, most recent episode depressed ([place]) Diagnosis:   Patient Active Problem List   Diagnosis Date Noted  . Bipolar 1 disorder, mixed, severe ([place]) [F31.63] 04/22/2015  . Hyperlipidemia [E78.5] 04/22/2015  . Essential hypertension [I10] 04/22/2015  . Type 2 diabetes mellitus with hyperglycemia ([place]) [E11.65] 04/22/2015  . Metabolic acidosis [Q94.5] 04/22/2015  . Hypothyroidism [E03.9] 04/22/2015  .  Bipolar I disorder, most recent episode depressed ([place]) [F31.30]    Total Time spent with patient: 25 minutes     Past Medical History:  Past Medical History  Diagnosis Date  . Anxiety   . Hypertension   . Diabetes mellitus without complication ([place])   . Hypercholesteremia   . Hypothyroid   . Nerve damage     both legs    Past Surgical History  Procedure Laterality Date  . Splenectomy Right unknown    per client  . Rotator cuff repair Right unknown    per client, surgery scar noted  . Knee surgery Left unknown    surgical scar noted   Family History: History reviewed. No pertinent family history.  Social History:  History  Alcohol Use No     History  Drug Use No    Social History   Social History  . Marital Status: Single    Spouse Name: N/A  . Number of Children: N/A  . Years of Education: N/A   Social History Main Topics  . Smoking status: Current Every Day Smoker -- 0.50 packs/day    Types: Cigarettes  . Smokeless tobacco: None  . Alcohol Use: No  . Drug Use: No  . Sexual Activity: Not Currently   Other Topics Concern  . None   Social History Narrative   Additional Social History:   Sleep: Good  Appetite:  Improved   Current Medications: Current Facility-Administered Medications  Medication Dose Route Frequency Provider Last Rate Last Dose  . acetaminophen (  TYLENOL) tablet 650 mg  650 mg Oral Q6H PRN Laverle Hobby, PA-C      . ALPRAZolam [name]) tablet 1 mg  1 mg Oral TID Jenne Campus, MD   1 mg at 04/24/15 1157  . alum & mag hydroxide-simeth (MAALOX/MYLANTA) 200-200-20 MG/5ML suspension 30 mL  30 mL Oral Q4H PRN Laverle Hobby, PA-C      . amLODipine (NORVASC) tablet 5 mg  5 mg Oral Daily Orson Eva, MD   5 mg at 04/24/15 0907  . aspirin EC tablet 81 mg  81 mg Oral Q1200 Laverle Hobby, PA-C   81 mg at 04/24/15 1157  . diphenhydrAMINE-zinc acetate (BENADRYL) 2-0.1 % cream   Topical BID PRN Encarnacion Slates, NP      . insulin aspart  (novoLOG) injection 0-15 Units  0-15 Units Subcutaneous TID WC Encarnacion Slates, NP   0 Units at 04/22/15 1714  . insulin aspart (novoLOG) injection 4 Units  4 Units Subcutaneous TID WC Encarnacion Slates, NP   4 Units at 04/22/15 1716  . levothyroxine (SYNTHROID, LEVOTHROID) tablet 25 mcg  25 mcg Oral QAC breakfast Laverle Hobby, PA-C   25 mcg at 04/24/15 0645  . lithium carbonate capsule 300 mg  300 mg Oral BID WC Laverle Hobby, PA-C   300 mg at 04/24/15 0908  . magnesium hydroxide (MILK OF MAGNESIA) suspension 30 mL  30 mL Oral Daily PRN Laverle Hobby, PA-C      . metFORMIN (GLUCOPHAGE) tablet 500 mg  500 mg Oral Q breakfast Laverle Hobby, PA-C   500 mg at 04/24/15 0908  . nicotine (NICODERM CQ - dosed in mg/24 hours) patch 21 mg  21 mg Transdermal Daily Myer Peer Telvin Reinders, MD   21 mg at 04/24/15 1200  . pantoprazole (PROTONIX) EC tablet 40 mg  40 mg Oral Daily Laverle Hobby, PA-C   40 mg at 04/24/15 0908  . QUEtiapine (SEROQUEL) tablet 300 mg  300 mg Oral QHS Jenne Campus, MD   300 mg at 04/23/15 2108    Lab Results:  Results for orders placed or performed during the hospital encounter of 04/22/15 (from the past 48 hour(s))  Glucose, capillary     Status: Abnormal   Collection Time: 04/22/15  5:13 PM  Result Value Ref Range   Glucose-Capillary 120 (H) 65 - 99 mg/dL  Glucose, capillary     Status: Abnormal   Collection Time: 04/22/15  9:07 PM  Result Value Ref Range   Glucose-Capillary 126 (H) 65 - 99 mg/dL  Glucose, capillary     Status: Abnormal   Collection Time: 04/23/15  6:20 AM  Result Value Ref Range   Glucose-Capillary 133 (H) 65 - 99 mg/dL  Hemoglobin A1c     Status: Abnormal   Collection Time: 04/23/15  6:32 AM  Result Value Ref Range   Hgb A1c MFr Bld 6.6 (H) 4.8 - 5.6 %    Comment: (NOTE)         Pre-diabetes: 5.7 - 6.4         Diabetes: >6.4         Glycemic control for adults with diabetes: <7.0    Mean Plasma Glucose 143 mg/dL    Comment: (NOTE) Performed At:  [facility] [address] [place], [place] [id number] Lindon Romp MD PN:[id number] Performed at [facility]   Lipid panel, fasting     Status: Abnormal   Collection Time:  04/23/15  6:32 AM  Result Value Ref Range   Cholesterol 234 (H) 0 - 200 mg/dL   Triglycerides 237 (H) <150 mg/dL   HDL 54 >40 mg/dL   Total CHOL/HDL Ratio 4.3 RATIO   VLDL 47 (H) 0 - 40 mg/dL   LDL Cholesterol 133 (H) 0 - 99 mg/dL    Comment:        Total Cholesterol/HDL:CHD Risk Coronary Heart Disease Risk Table                     Men   Women  1/2 Average Risk   3.4   3.3  Average Risk       5.0   4.4  2 X Average Risk   9.6   7.1  3 X Average Risk  23.4   11.0        Use the calculated Patient Ratio above and the CHD Risk Table to determine the patient's CHD Risk.        ATP III CLASSIFICATION (LDL):  <100     mg/dL   Optimal  100-129  mg/dL   Near or Above                    Optimal  130-159  mg/dL   Borderline  160-189  mg/dL   High  >190     mg/dL   Very High Performed at [facility]   Prolactin     Status: None   Collection Time: 04/23/15  6:32 AM  Result Value Ref Range   Prolactin 20.4 4.8 - 23.3 ng/mL    Comment: (NOTE) Performed At: [facility] [place], [place] [id number] Lindon Romp MD KG:[id number] Performed at [facility] metabolic panel     Status: Abnormal   Collection Time: 04/23/15  6:32 AM  Result Value Ref Range   Sodium 139 135 - 145 mmol/L   Potassium 4.2 3.5 - 5.1 mmol/L   Chloride 106 101 - 111 mmol/L   CO2 23 22 - 32 mmol/L   Glucose, Bld 133 (H) 65 - 99 mg/dL   BUN 10 6 - 20 mg/dL   Creatinine, Ser 0.69 0.44 - 1.00 mg/dL   Calcium 10.1 8.9 - 10.3 mg/dL   GFR calc non Af Amer >60 >60 mL/min   GFR calc Af Amer >60 >60 mL/min    Comment: (NOTE) The eGFR has been calculated using the CKD EPI equation. This calculation has not been validated in all clinical  situations. eGFR's persistently <60 mL/min signify possible Chronic Kidney Disease.    Anion gap 10 5 - 15    Comment: Performed at [facility]  Lactic acid, plasma     Status: None   Collection Time: 04/23/15  6:32 AM  Result Value Ref Range   Lactic Acid, Venous 1.4 0.5 - 2.0 mmol/L    Comment: Performed at [facility]  Glucose, capillary     Status: Abnormal   Collection Time: 04/23/15 11:49 AM  Result Value Ref Range   Glucose-Capillary 114 (H) 65 - 99 mg/dL  TSH     Status: None   Collection Time: 04/23/15  6:25 PM  Result Value Ref Range   TSH 0.654 0.350 - 4.500 uIU/mL    Comment: Performed at [facility]  T4, free     Status: None   Collection Time: 04/23/15  6:25 PM  Result  Value Ref Range   Free T4 0.61 0.61 - 1.12 ng/dL    Comment: Performed at [facility]  Glucose, capillary     Status: Abnormal   Collection Time: 04/23/15  8:29 PM  Result Value Ref Range   Glucose-Capillary 146 (H) 65 - 99 mg/dL  Glucose, capillary     Status: Abnormal   Collection Time: 04/24/15  6:29 AM  Result Value Ref Range   Glucose-Capillary 160 (H) 65 - 99 mg/dL  Glucose, capillary     Status: Abnormal   Collection Time: 04/24/15 12:23 PM  Result Value Ref Range   Glucose-Capillary 164 (H) 65 - 99 mg/dL   Comment 1 Notify RN    Comment 2 Document in Chart     Blood Alcohol level:  Lab Results  Component Value Date   ETH <5 04/21/2015    Physical Findings: AIMS: Facial and Oral Movements Muscles of Facial Expression: None, normal Lips and Perioral Area: None, normal Jaw: None, normal Tongue: None, normal,Extremity Movements Upper (arms, wrists, hands, fingers): None, normal Lower (legs, knees, ankles, toes): None, normal, Trunk Movements Neck, shoulders, hips: None, normal, Overall Severity Severity of abnormal movements (highest score from questions above): None, normal Incapacitation due to abnormal  movements: None, normal Patient's awareness of abnormal movements (rate only patient's report): No Awareness, Dental Status Current problems with teeth and/or dentures?: Yes Does patient usually wear dentures?: Yes  CIWA:    COWS:     Musculoskeletal: Strength & Muscle Tone: within normal limits Gait & Station: normal Patient leans: N/A  Psychiatric Specialty Exam: ROS no chest pain, no shortness of breath  Blood pressure 135/100, pulse 118, temperature 97.6 F (36.4 C), temperature source Oral, resp. rate 20, height 5' 5"  (1.651 m), weight 175 lb (79.379 kg).Body mass index is 29.12 kg/(m^2).  General Appearance:  Improved grooming   Eye Contact::  Good  Speech:  Normal Rate  Volume:  Normal  Mood:  Remains depressed, but has improved   Affect: no longer tearful, less irritable today, smiles and even jokes appropriately during our session  Thought Process:  Linear  Orientation:   Fully alert and attentive   Thought Content:  No hallucinations, no delusions, not internally preoccupied   Suicidal Thoughts:  No- no current suicidal or self injurious ideations , contracts for safety on unit   Homicidal Thoughts:  No- denies any homicidal ideations , as noted currently denies any homicidal ideations towards son or daughter in law   Memory:  recent and remote grossly intact   Judgement:  Improving   Insight:  Improving   Psychomotor Activity:  Normal  Concentration:  Good  Recall:  Good  Fund of Knowledge:Good  Language: Good  Akathisia:  Negative  Handed:  Right  AIMS (if indicated):     Assets:  Desire for Improvement Resilience  ADL's:  Intact  Cognition: WNL  Sleep:  Number of Hours: 6.75  Assessment -patient has , as discussed with staff, continued to present with some irritability and focused on complaints about different issues. Today, however, she presents with clearly improved mood and improved range of affect, and states she is irritable partly because of her desire to  be discharged in order to see her grandchildren , who are visiting , and because of nicotine cravings. No SI or HI, no psychotic symptoms at this time. Tolerating medications well- no symptoms of WDL  Treatment Plan Summary: Daily contact with patient to assess and evaluate symptoms and progress in  treatment, Medication management, Plan inpatient admission  and medications as below  Encourage ongoing group , milieu participation  Continue Xanax 1 mgr TID for anxiety  Continue  Seroquel to 300 mgrs QHS for mood disorder Continue Lithium 300 mgrs BID for mood disorder  Continue management for Diabetes and Hypothyroidism - appreciate hospitalist follow up to help with medical management  Treatment team working on disposition plans - consider discharge soon as she continues to improve Neita Garnet, MD 04/24/2015, 12:33 PM

## 2015-04-25 LAB — MICROALBUMIN / CREATININE URINE RATIO
Creatinine, Urine: 44.6 mg/dL
Microalb Creat Ratio: <6.7 mg/g creat (ref 0.0–30.0)

## 2015-04-25 LAB — LITHIUM LEVEL: Lithium Lvl: 0.41 mmol/L — ABNORMAL LOW (ref 0.60–1.20)

## 2015-04-25 MED ORDER — LITHIUM CARBONATE 300 MG PO CAPS: 300.0000 mg | ORAL_CAPSULE | Freq: Two times a day (BID) | ORAL | Status: DC

## 2015-04-25 MED ORDER — INSULIN ASPART 100 UNIT/ML ~~LOC~~ SOLN: 0.0000 [IU] | Freq: Three times a day (TID) | SUBCUTANEOUS | Status: DC

## 2015-04-25 MED ORDER — NICOTINE 21 MG/24HR TD PT24: 21.0000 mg | MEDICATED_PATCH | Freq: Every day | TRANSDERMAL | Status: DC

## 2015-04-25 MED ORDER — QUETIAPINE FUMARATE 300 MG PO TABS: 300.0000 mg | ORAL_TABLET | Freq: Every day | ORAL | Status: DC

## 2015-04-25 MED ORDER — AMLODIPINE BESYLATE 5 MG PO TABS: 5.0000 mg | ORAL_TABLET | Freq: Every day | ORAL | Status: DC

## 2015-04-25 MED ORDER — METFORMIN HCL 500 MG PO TABS: 500.0000 mg | ORAL_TABLET | Freq: Every day | ORAL | Status: DC

## 2015-04-25 MED ORDER — PREGABALIN 100 MG PO CAPS
100.0000 mg | ORAL_CAPSULE | Freq: Once | ORAL | Status: AC
  Administered 2015-04-25: 100 mg via ORAL
  Filled 2015-04-25: qty 1

## 2015-04-25 MED ORDER — LEVOTHYROXINE SODIUM 25 MCG PO TABS: 25.0000 ug | ORAL_TABLET | Freq: Every day | ORAL | Status: DC

## 2015-04-25 NOTE — Progress Notes (Signed)
  [facility] Adult Case Management Discharge Plan :  Will you be returning to the same living situation after discharge:  Yes,  with son At discharge, do you have transportation home?: Yes,  family Do you have the ability to pay for your medications: Yes,  no barriers  Release of information consent forms completed and in the chart;  Patient's signature needed at discharge.  Patient to Follow up at: Follow-up Information    Go to [facility].   Specialty:  Family Medicine   Contact information:   [address] [place] [address] [phone]       Schedule an appointment as soon as possible for a visit with [place].   Why:  follow-up with DM    Contact information:   [address] [place] [phone]      Next level of care provider has access to [place] and Suicide Prevention discussed: No.  Patient refused to allow consent  Have you used any form of tobacco in the last 30 days? (Cigarettes, Smokeless Tobacco, Cigars, and/or Pipes): Yes  Has patient been referred to the Quitline?: Patient refused referral  Patient has been referred for addiction treatment: N/A  Lysle Dingwall 04/25/2015, 11:17 AM

## 2015-04-25 NOTE — BHH Suicide Risk Assessment (Addendum)
[facility] Discharge Suicide Risk Assessment   Principal Problem: Bipolar I disorder, most recent episode depressed [facility]) Discharge Diagnoses:  Patient Active Problem List   Diagnosis Date Noted  . Bipolar 1 disorder, mixed, severe ([place]) [F31.63] 04/22/2015  . Hyperlipidemia [E78.5] 04/22/2015  . Essential hypertension [I10] 04/22/2015  . Type 2 diabetes mellitus with hyperglycemia ([place]) [E11.65] 04/22/2015  . Metabolic acidosis 99991111 04/22/2015  . Hypothyroidism [E03.9] 04/22/2015  . Bipolar I disorder, most recent episode depressed ([place]) [F31.30]     Total Time spent with patient: 30 minutes  Musculoskeletal: Strength & Muscle Tone: within normal limits Gait & Station: normal Patient leans: N/A  Psychiatric Specialty Exam: ROS  Blood pressure 131/90, pulse 99, temperature 98.6 F (37 C), temperature source Oral, resp. rate 16, height 5\' 5"  (1.651 m), weight 175 lb (79.379 kg).Body mass index is 29.12 kg/(m^2).  General Appearance: Fairly Groomed  Engineer, water::  Good  Speech:  Normal Rate409  Volume:  Normal  Mood:  states she is feeling better, less depressed   Affect:  at this time more reactive, appropriate, smiles at times appropriately   Thought Process:  Linear  Orientation:  Full (Time, Place, and Person)  Thought Content:  denies hallucinations, no delusions, not internally preoccupied   Suicidal Thoughts:  No at this time denies any suicidal or self injurious ideations   Homicidal Thoughts:  No denies any violent or homicidal ideations, specifically also denies any violent or homicidal ideations towards her son- states " I love him, I just want him to stop drinking so he can have a better life "  Memory:  recent and remote grossly intact   Judgement:  Other:  improved   Insight:  Fair  Psychomotor Activity:  Normal  Concentration:  Good  Recall:  Good  Fund of Knowledge:Good  Language: Good  Akathisia:  Negative  Handed:  Right  AIMS (if indicated):     Assets:   Desire for Improvement Resilience  Sleep:  Number of Hours: 4.5  Cognition: WNL  ADL's:   Improved    Mental Status Per Nursing Assessment::   On Admission:  Self-harm thoughts  Demographic Factors:  60 year old female, recently relocated from [place] to live with adult son  Loss Factors: Recent relocation, stress related to son's alcohol abuse , limited local support network   Historical Factors: Reports history of Bipolar Disorder, prior psychiatric admissions   Risk Reduction Factors:   Sense of responsibility to family, Living with another person, especially a relative and Positive coping skills or problem solving skills  Continued Clinical Symptoms:  As discussed with Nursing staff, patient has continued to present with tendency to be demanding , irritable, focused on medication issues . At this time patient presents alert, attentive, mood is described as improved, less depressed, affect is more reactive, and at this time not particularly angry or irritable, states " I feel much better today", no thought disorder , denies suicidal ideations, denies homicidal ideations  , denies hallucinations, no delusions, does not appear internally preoccupied, oriented x 3. Denies medication side effects. Patient requesting discharge, states her grandsons are visiting for the holiday and does not want to miss seeing them- she is future oriented.   Cognitive Features That Contribute To Risk:  No gross cognitive deficits noted upon discharge. Is alert , attentive, and oriented x 3   Suicide Risk:  Mild:  Suicidal ideation of limited frequency, intensity, duration, and specificity.  There are no identifiable plans, no associated intent,  mild dysphoria and related symptoms, good self-control (both objective and subjective assessment), few other risk factors, and identifiable protective factors, including available and accessible social support.  Follow-up Information    Go to [facility].   Specialty:  Family Medicine   Contact information:   [address] [place]  [address] [phone]       Plan Of Care/Follow-up recommendations:  Activity:  as tolerated  Diet:  heart healthy, diabetic diet Tests:  NA Other:  see below Patient is requesting discharge, and there are no current grounds for continued involuntary commitment  She is leaving unit in good spirits, with plan of returning home - lives with son Plans to follow up as above Also encouraged to establish outpatient medical care with a PCP.  Neita Garnet, MD 04/25/2015, 10:01 AM

## 2015-04-25 NOTE — Discharge Summary (Signed)
Physician Discharge Summary Note  Patient:  Melanie Foster is an 60 y.o., female MRN:  [id number] DOB:  09/21/1955 Patient phone:  [phone] (home)  Patient address:   [place] [address],  Total Time spent with patient: 30 minutes  Date of Admission:  04/22/2015 Date of Discharge: 04/25/2015   Reason for Admission:PER HPI- Azucena is a 26 year old African-American female. Admitted to [facility] from the [facility] long hospital with complaints of worsening symptoms of depression & anxiety triggering suicidal ideations. During this assessment; Giovana reports, I came here because I'm so angry & depressed. It has gone on for 2 months that I thought about killing myself. I tried to commit suicide not too long ago, but it did not work. Now, if you will let be, I don't really wanna talk because I'm angry & mad".   Principal Problem: Bipolar I disorder, most recent episode depressed [facility]) Discharge Diagnoses: Patient Active Problem List   Diagnosis Date Noted  . Bipolar 1 disorder, mixed, severe ([place]) [F31.63] 04/22/2015  . Hyperlipidemia [E78.5] 04/22/2015  . Essential hypertension [I10] 04/22/2015  . Type 2 diabetes mellitus with hyperglycemia ([place]) [E11.65] 04/22/2015  . Metabolic acidosis 99991111 04/22/2015  . Hypothyroidism [E03.9] 04/22/2015  . Bipolar I disorder, most recent episode depressed ([place]) [F31.30]     Past Psychiatric History: See Above  Past Medical History:  Past Medical History  Diagnosis Date  . Anxiety   . Hypertension   . Diabetes mellitus without complication ([place])   . Hypercholesteremia   . Hypothyroid   . Nerve damage     both legs    Past Surgical History  Procedure Laterality Date  . Splenectomy Right unknown    per client  . Rotator cuff repair Right unknown    per client, surgery scar noted  . Knee surgery Left unknown    surgical scar noted   Family History: History reviewed. No pertinent family history. Family Psychiatric  History: See  H&P Social History:  History  Alcohol Use No     History  Drug Use No    Social History   Social History  . Marital Status: Single    Spouse Name: N/A  . Number of Children: N/A  . Years of Education: N/A   Social History Main Topics  . Smoking status: Current Every Day Smoker -- 0.50 packs/day    Types: Cigarettes  . Smokeless tobacco: None  . Alcohol Use: No  . Drug Use: No  . Sexual Activity: Not Currently   Other Topics Concern  . None   Social History Narrative    Hospital Course: Ajana Ditsworth was admitted for Bipolar I disorder, most recent episode depressed ([place]) and crisis management.  Pt was treated discharged with the medications listed below under Medication List.  Medical problems were identified and treated as needed.  Home medications were restarted as appropriate.  Improvement was monitored by observation and [name] 's daily report of symptom reduction.  Emotional and mental status was monitored by daily self-inventory reports completed by [name] and clinical staff.         Geraldy Angelos was evaluated by the treatment team for stability and plans for continued recovery upon discharge. Coretta Trama 's motivation was an integral factor for scheduling further treatment. Employment, transportation, bed availability, health status, family support, and any pending legal issues were also considered during hospital stay. Pt was offered further treatment options upon discharge including but not limited to Residential, Intensive Outpatient,  and Outpatient treatment.  Tamicka Kispert will follow up with the services as listed below under Follow Up Information.     Upon completion of this admission the patient was both mentally and medically stable for discharge denying suicidal/homicidal ideation, auditory/visual/tactile hallucinations, delusional thoughts and paranoia.     [name] responded well to treatment with xanax,lyrica and Seroquel without adverse effects.  Pt demonstrated  improvement without reported or observed adverse effects to the point of stability appropriate for outpatient management. Pertinent labs include: Lithiun level,CMP, Lipid  Hgb A1C 6.6 (high), for which outpatient follow-up is necessary for lab recheck as mentioned below. Reviewed CBC, CMP, BAL, and UDS+ benzodiazepines and THC; all unremarkable aside from noted exceptions.   Physical Findings: AIMS: Facial and Oral Movements Muscles of Facial Expression: None, normal Lips and Perioral Area: None, normal Jaw: None, normal Tongue: None, normal,Extremity Movements Upper (arms, wrists, hands, fingers): None, normal Lower (legs, knees, ankles, toes): None, normal, Trunk Movements Neck, shoulders, hips: None, normal, Overall Severity Severity of abnormal movements (highest score from questions above): None, normal Incapacitation due to abnormal movements: None, normal Patient's awareness of abnormal movements (rate only patient's report): No Awareness, Dental Status Current problems with teeth and/or dentures?: Yes Does patient usually wear dentures?: Yes  CIWA:    COWS:     Musculoskeletal: Strength & Muscle Tone: within normal limits Gait & Station: normal Patient leans: N/A  Psychiatric Specialty Exam: See  SRA by MD Review of Systems  Psychiatric/Behavioral: Negative for suicidal ideas and hallucinations. The patient is nervous/anxious.   All other systems reviewed and are negative.   Blood pressure 131/90, pulse 99, temperature 98.6 F (37 C), temperature source Oral, resp. rate 16, height 5\' 5"  (1.651 m), weight 79.379 kg (175 lb).Body mass index is 29.12 kg/(m^2).  Have you used any form of tobacco in the last 30 days? (Cigarettes, Smokeless Tobacco, Cigars, and/or Pipes): Yes  Has this patient used any form of tobacco in the last 30 days? (Cigarettes, Smokeless Tobacco, Cigars, and/or Pipes)  Yes, A prescription for an FDA-approved tobacco cessation medication was offered at  discharge and the patient refused  Blood Alcohol level:  Lab Results  Component Value Date   [facility] <5 [id number]    Metabolic Disorder Labs:  Lab Results  Component Value Date   HGBA1C 6.6* 04/23/2015   MPG 143 04/23/2015   Lab Results  Component Value Date   PROLACTIN 20.4 04/23/2015   Lab Results  Component Value Date   CHOL 234* 04/23/2015   TRIG 237* 04/23/2015   HDL 54 04/23/2015   CHOLHDL 4.3 04/23/2015   VLDL 47* 04/23/2015   LDLCALC 133* 04/23/2015    See Psychiatric Specialty Exam and Suicide Risk Assessment completed by Attending Physician prior to discharge.  Discharge destination:  Home  Is patient on multiple antipsychotic therapies at discharge:  No   Has Patient had three or more failed trials of antipsychotic monotherapy by history:  No  Recommended Plan for Multiple Antipsychotic Therapies: NA  Discharge Instructions    Activity as tolerated - No restrictions    Complete by:  As directed      Diet general    Complete by:  As directed      Discharge instructions    Complete by:  As directed   Take all medications as prescribed. Keep all follow-up appointments as scheduled.  Do not consume alcohol or use illegal drugs while on prescription medications. Report any adverse effects from your medications to  your primary care provider promptly.  In the event of recurrent symptoms or worsening symptoms, call 911, a crisis hotline, or go to the nearest emergency department for evaluation.            Medication List    STOP taking these medications        hydrOXYzine 25 MG tablet  Commonly known as:  ATARAX/VISTARIL     pregabalin 100 MG capsule  Commonly known as:  LYRICA     zolpidem 10 MG tablet  Commonly known as:  AMBIEN      TAKE these medications      Indication   ALPRAZolam 1 MG tablet  Commonly known as:  XANAX  Take 1 mg by mouth 3 (three) times daily as needed for anxiety.      amLODipine 5 MG tablet  Commonly known as:   NORVASC  Take 1 tablet (5 mg total) by mouth daily.   Indication:  High Blood Pressure     aspirin EC 81 MG tablet  Take 81 mg by mouth daily at 12 noon.      insulin aspart 100 UNIT/ML injection  Commonly known as:  novoLOG  Inject 0-15 Units into the skin 3 (three) times daily with meals.      levothyroxine 25 MCG tablet  Commonly known as:  SYNTHROID, LEVOTHROID  Take 1 tablet (25 mcg total) by mouth daily before breakfast.   Indication:  Underactive Thyroid     lithium carbonate 300 MG capsule  Take 1 capsule (300 mg total) by mouth 2 (two) times daily with a meal.   Indication:  mood stabliz     metFORMIN 500 MG tablet  Commonly known as:  GLUCOPHAGE  Take 1 tablet (500 mg total) by mouth daily with breakfast.   Indication:  Type 2 Diabetes     nicotine 21 mg/24hr patch  Commonly known as:  NICODERM CQ - dosed in mg/24 hours  Place 1 patch (21 mg total) onto the skin daily.   Indication:  Nicotine Addiction     omeprazole 20 MG capsule  Commonly known as:  PRILOSEC  Take 20 mg by mouth daily.      QUEtiapine 300 MG tablet  Commonly known as:  SEROQUEL  Take 1 tablet (300 mg total) by mouth at bedtime.            Follow-up Information    Go to [facility].   Specialty:  Family Medicine   Contact information:   [address] [place] [address] [phone]       Follow-up recommendations:  Activity:  as Tolerated Diet:  Heart Healthy Other:  [place] [facility]  Comments:  Take all medications as prescribed. Keep all follow-up appointments as scheduled.  Do not consume alcohol or use illegal drugs while on prescription medications. Report any adverse effects from your medications to your primary care provider promptly.  In the event of recurrent symptoms or worsening symptoms, call 911, a crisis hotline, or go to the nearest emergency department for evaluation.   Signed: Derrill Center, NP 04/25/2015, 9:55 AM    Patient seen, Suicide Assessment Completed.  Disposition Plan Reviewed

## 2015-04-25 NOTE — Progress Notes (Signed)
Pt complained of severe pain in her legs around midnight   She said she was on Lyrica at home but wasn't getting it here    NP notified and received order for one time dose of lyrica which was given to her   She is presently asleep

## 2015-04-25 NOTE — Discharge Instructions (Signed)
Patient to follow-up [facility] Diabetes Management

## 2015-04-25 NOTE — Progress Notes (Signed)
Melanie Foster was D/C from the unit to lobby accompanied by family.  She was agitated because it was taking too long to get her belongings.  She voiced no SI/HI or A/V halluciations.  Pt. Denies any pain or discomfort.  D/C instructions and medications reviewed with pt.  Pt. verbalized understanding of medications and d/c instructions.   All belongings from locker # 5 (purse, mesh bag, multiple notebooks, snacks, old phone/charger, cigarettes, duffle bag with snacks inside, multi colored blanket, 3 valuables envelopes and 1 laptop) returned to pt. Q 15 min checks maintained until discharge.  Pt. Left the unit in no apparent distress.

## 2015-04-25 NOTE — [facility] Group Notes (Signed)
[place] Group Notes:  (Clinical Social Work)  04/25/2015  11:00AM-12:00PM  Summary of Progress/Problems:  The main focus of today's process group was to listen to a variety of genres of music and to identify that different types of music provoke different responses.  The patient then was able to identify personally what was soothing for them, as well as energizing, as well as how patient can personally use this knowledge in sleep habits, with depression, and with other symptoms.  The patient expressed at the beginning of group that she was enjoying the songs and asked CSW to write down the songs and artists on the whiteboard so she could have it.  After just 3 songs, however, she left in order to find the doctor and did not return.  Type of Therapy:  Music Therapy   Participation Level:  Active  Participation Quality:  Attentive\  Affect:  Blunted  Cognitive:  Oriented  Insight:  Limited  Engagement in Therapy:  Limited  Modes of Intervention:   Activity, Exploration  Selmer Dominion, LCSW 04/25/2015

## 2015-04-26 NOTE — Clinical Social Work Note (Signed)
CSW scheduled appointment w [facility] for aftercare; however, facility could not contact patient at numbers provided on facesheet.  As appointment time/date could not be communicated to patient, appointment cannot be scheduled.  Patient has instructions on how to follow up w this provider in discharge instructions.  Edwyna Shell, LCSW Lead Clinical Social Worker Phone:  [phone]

## 2015-04-27 ENCOUNTER — Ambulatory Visit: Payer: Medicare Other | Admitting: Family Medicine

## 2015-04-27 ENCOUNTER — Inpatient Hospital Stay: Payer: Self-pay | Admitting: Internal Medicine

## 2015-04-28 ENCOUNTER — Encounter: Payer: Self-pay | Admitting: Internal Medicine

## 2015-04-28 ENCOUNTER — Ambulatory Visit (HOSPITAL_BASED_OUTPATIENT_CLINIC_OR_DEPARTMENT_OTHER): Payer: Medicare Other | Admitting: Clinical

## 2015-04-28 ENCOUNTER — Ambulatory Visit: Payer: Medicare Other | Attending: Internal Medicine | Admitting: Internal Medicine

## 2015-04-28 VITALS — BP 137/86 | HR 94 | Temp 98.0°F | Wt 178.6 lb

## 2015-04-28 DIAGNOSIS — I10 Essential (primary) hypertension: Secondary | ICD-10-CM

## 2015-04-28 DIAGNOSIS — E1140 Type 2 diabetes mellitus with diabetic neuropathy, unspecified: Secondary | ICD-10-CM

## 2015-04-28 DIAGNOSIS — F333 Major depressive disorder, recurrent, severe with psychotic symptoms: Secondary | ICD-10-CM

## 2015-04-28 DIAGNOSIS — F3163 Bipolar disorder, current episode mixed, severe, without psychotic features: Secondary | ICD-10-CM

## 2015-04-28 DIAGNOSIS — F3164 Bipolar disorder, current episode mixed, severe, with psychotic features: Secondary | ICD-10-CM

## 2015-04-28 DIAGNOSIS — F329 Major depressive disorder, single episode, unspecified: Secondary | ICD-10-CM | POA: Insufficient documentation

## 2015-04-28 DIAGNOSIS — E039 Hypothyroidism, unspecified: Secondary | ICD-10-CM

## 2015-04-28 DIAGNOSIS — J42 Unspecified chronic bronchitis: Secondary | ICD-10-CM

## 2015-04-28 DIAGNOSIS — B009 Herpesviral infection, unspecified: Secondary | ICD-10-CM

## 2015-04-28 DIAGNOSIS — F4323 Adjustment disorder with mixed anxiety and depressed mood: Secondary | ICD-10-CM

## 2015-04-28 DIAGNOSIS — E1165 Type 2 diabetes mellitus with hyperglycemia: Secondary | ICD-10-CM | POA: Diagnosis not present

## 2015-04-28 DIAGNOSIS — Z720 Tobacco use: Secondary | ICD-10-CM | POA: Insufficient documentation

## 2015-04-28 DIAGNOSIS — E785 Hyperlipidemia, unspecified: Secondary | ICD-10-CM | POA: Diagnosis not present

## 2015-04-28 MED ORDER — VERAPAMIL HCL 40 MG PO TABS: 40.0000 mg | ORAL_TABLET | Freq: Two times a day (BID) | ORAL | Status: DC

## 2015-04-28 MED ORDER — GLUCOSE BLOOD VI STRP: ORAL_STRIP | Status: DC

## 2015-04-28 MED ORDER — METFORMIN HCL 500 MG PO TABS: 500.0000 mg | ORAL_TABLET | Freq: Every day | ORAL | Status: DC

## 2015-04-28 MED ORDER — BLOOD GLUCOSE MONITOR KIT
PACK | Status: DC
Start: 2015-04-28 — End: 2015-09-23

## 2015-04-28 MED ORDER — OMEPRAZOLE 20 MG PO CPDR: 20.0000 mg | DELAYED_RELEASE_CAPSULE | Freq: Every day | ORAL | Status: DC

## 2015-04-28 MED ORDER — AMLODIPINE BESYLATE 5 MG PO TABS: 5.0000 mg | ORAL_TABLET | Freq: Every day | ORAL | Status: DC

## 2015-04-28 MED ORDER — BLOOD PRESSURE CUFF MISC: 1.0000 "application " | Freq: Every day | Status: DC

## 2015-04-28 MED ORDER — LEVOTHYROXINE SODIUM 25 MCG PO TABS: 25.0000 ug | ORAL_TABLET | Freq: Every day | ORAL | Status: DC

## 2015-04-28 MED ORDER — ONETOUCH ULTRASOFT LANCETS MISC
Status: DC
Start: 2015-04-28 — End: 2015-09-23

## 2015-04-28 MED ORDER — GABAPENTIN 300 MG PO CAPS: 300.0000 mg | ORAL_CAPSULE | Freq: Three times a day (TID) | ORAL | Status: DC

## 2015-04-28 NOTE — Progress Notes (Signed)
Melanie Foster, is a 60 y.o. female  [id number]  [id number]  DOB - 1955/12/29  CC:  Chief Complaint  Patient presents with  . Hospitalization Follow-up    Suicidal Ideation       HPI: Heathr Foster is a 60 y.o. female AAF here today to establish medical care.  Patient was recently admitted to our inpatient psychiatry facility on 04/22/2015 and discharged on 04/25/2015.  Abnormal she found to have worsening symptoms of bipolar disorder.  She recently moved here from [place] and had worsening symptoms of depression, anxiety triggering suicidal ideations requiring inpatient admission to psychiatry.  She was also seen by the Hospitalist service for ongoing medical issues including diabetes type 2, hypertension, hypothyroidism, GERD, atypical chest pains which was reproducible on exam with EKG that showed no concerning ischemic changes.    Patient was subsequently discharged with a escorted surgical care for aftercare. However, patient says that since she was discharged. She hasn't seen anybody except for now. Of note, when she arrived in our clinic. She appears a bit confused as well as anxious today, but denies any suicidal ideation or suicidal attempts.  She is having a hard time adjusting to [place] from [place]. She is currently living with her son and his wife.  She is very frustrated with the wife and dealing with home situations/living conditions.    During our conversation, she asked if she could be changed to back to verapamil for blood pressures.  She states her pharmacy [facility]) would have the info.  She gave me there number.  However, during the my physical exam, she starts calling the pharmacy and having a conversation with them.    Later after her phone conversation, she called me back and asked about pain meds, ativan (which she has been on for very long time) as well as glucometer/bp cuff since she does not have.  No Known Allergies Past Medical History  Diagnosis Date  .  Anxiety   . Hypertension   . Diabetes mellitus without complication ([place])   . Hypercholesteremia   . Hypothyroid   . Nerve damage     both legs   Current Outpatient Prescriptions on File Prior to Visit  Medication Sig Dispense Refill  . ALPRAZolam (XANAX) 1 MG tablet Take 1 mg by mouth 3 (three) times daily as needed for anxiety.    Marland Kitchen aspirin EC 81 MG tablet Take 81 mg by mouth daily at 12 noon.    . lithium carbonate 300 MG capsule Take 1 capsule (300 mg total) by mouth 2 (two) times daily with a meal. 60 capsule 0  . nicotine (NICODERM CQ - DOSED IN MG/24 HOURS) 21 mg/24hr patch Place 1 patch (21 mg total) onto the skin daily. 28 patch 0  . QUEtiapine (SEROQUEL) 300 MG tablet Take 1 tablet (300 mg total) by mouth at bedtime. 30 tablet 0   No current facility-administered medications on file prior to visit.     No family history on file. Social History   Social History  . Marital Status: Single    Spouse Name: N/A  . Number of Children: N/A  . Years of Education: N/A   Occupational History  . Not on file.   Social History Main Topics  . Smoking status: Current Every Day Smoker -- 0.50 packs/day    Types: Cigarettes  . Smokeless tobacco: Not on file  . Alcohol Use: No  . Drug Use: No  . Sexual Activity: Not Currently   Other Topics  Concern  . Not on file   Social History Narrative    Review of Systems: Constitutional: Negative for fever, chills, diaphoresis, activity change, appetite change and fatigue.   HENT: Negative for ear pain, nosebleeds, congestion, facial swelling, rhinorrhea, neck pain, neck stiffness and ear discharge.  Eyes: Negative for pain, discharge, redness, itching and visual disturbance. Respiratory: Negative for cough, choking, chest tightness, shortness of breath, wheezing and stridor.  Cardiovascular: Negative for chest pain, palpitations and leg swelling. Gastrointestinal: Negative for abdominal distention. Genitourinary: Negative for  dysuria, urgency, frequency, hematuria, flank pain, decreased urine volume, difficulty urinating and dyspareunia.  Musculoskeletal: Negative for back pain, joint swelling,and gait problem.  + intermittent  arthralgia , moves around. Takes lyrica for it. Neurological: Negative for dizziness, tremors, seizures, syncope, facial asymmetry, speech difficulty, weakness, light-headedness, numbness and headaches.  Hematological: Negative for adenopathy. Does not bruise/bleed easily. Psychiatric/Behavioral: Negative for hallucinations, behavioral problems, +confusion, + dysphoric mood, + decreased concentration and agitation. Having difficult time adjusting to life in [place].  Denies si/hi/ah/vh, but wishes she had not left [place].   Objective:   Filed Vitals:   04/28/15 1240  BP: 137/86  Pulse: 94  Temp: 98 F (36.7 C)    Physical Exam: Constitutional: Patient appears well-developed and well-nourished.  AAOx3 (place/time/date), but appears overall very anxious and confused.  +she was still wearing 2 hospital bands from on her Right wrist. HENT: Normocephalic, atraumatic, External right and left ear normal. Oropharynx is clear and moist.  Eyes: Conjunctivae and EOM are normal. PERRL, no scleral icterus. Neck: Normal ROM. Neck supple. No JVD.  CVS: RRR, S1/S2 +, no murmurs, no gallops, no carotid bruit.  Pulmonary: Effort and breath sounds normal, no stridor, rhonchi, wheezes, rales.  Abdominal: Soft. BS +, no distension, tenderness, rebound or guarding.  Musculoskeletal: Normal range of motion. No edema and no tenderness.  LE: bilat/ no c/c/e, pulses 2+ bilateral. Lymphadenopathy: No lymphadenopathy noted, cervical. Neuro: Alert. muscle tone coordination. No cranial nerve deficit grossly. Skin: Skin is warm and dry. No rash noted. Not diaphoretic. No erythema. No pallor. Psychiatric:  Anxious mood and affect. Appears very confused to me.   +smokes, use to take Chantix but gave her very  weird dreams/nightmares.  Lab Results  Component Value Date   WBC 9.4 04/21/2015   HGB 13.4 04/21/2015   HCT 41.8 04/21/2015   MCV 87.6 04/21/2015   PLT 385 04/21/2015   Lab Results  Component Value Date   CREATININE 0.69 04/23/2015   BUN 10 04/23/2015   NA 139 04/23/2015   K 4.2 04/23/2015   CL 106 04/23/2015   CO2 23 04/23/2015    Lab Results  Component Value Date   HGBA1C 6.6* 04/23/2015   Lipid Panel     Component Value Date/Time   CHOL 234* 04/23/2015 0632   TRIG 237* 04/23/2015 0632   HDL 54 04/23/2015 0632   CHOLHDL 4.3 04/23/2015 0632   VLDL 47* 04/23/2015 0632   LDLCALC 133* 04/23/2015 0632      Depression screen PHQ 2/9 04/28/2015  Decreased Interest 3  Down, Depressed, Hopeless 3  PHQ - 2 Score 6  Altered sleeping 3  Tired, decreased energy 3  Change in appetite 3  Feeling bad or failure about yourself  3  Trouble concentrating 3  Moving slowly or fidgety/restless 3  Suicidal thoughts 1  PHQ-9 Score 25  Difficult doing work/chores Extremely dIfficult   04/23/15 a1c 6.6  My review of her outside meds: from [facility] (tele  [phone], fax [phone]) faxed to [place]:  pt was prescribed: 1. Alprazolam 1mg  tid prn (As of 02/26/15) 2. chantix 07/31/2014 3. neurontin 300 qd 04/10/15 4. Hydrocodone/acetaminophen 7.5/325 10/07/14 5. Hydroxyzine 25mg  q6hrs 04/12/15 6. Synthroid 33mcg qd 04/10/15 7. lyrica 100mg  tid 04/12/15 8. Metformin 500mg  qd 04/20/15 9. Quetiapine 50mg  qam 04/10/15 10. Valacyclovir 1gm q8 x 7days 01/08/15 11. Verapamil 40mg  po bid 08/03/14  Assessment and plan:   1. Bipolar 1 w/ mixed, anxiety /depression - scoring very high on PHQ, she denies si/hi/ah/vh now, she states her home situation is safe - SW cs, Roselyn Reef, who was able to see her after her phone conversation, information was given about available psychiatric assistance - psychiatry cs placed as well. - inpt psyche placed her on seroquel 300 qhs, lithium 300mg  po bid - defer to  psyche.  2. Dm2, controlled a1c 6.6 (04/23/15)  3. Htn, controlled  - currently on norvasc 5, but she wants to be changed back to her old medications. - her pharmacy faxed [place] the list - I will change her back to Verapamil 40mg  po bid, dc norvasc.  4. Dm 2 w/ neuropathy - review of her faxed medical hx and meds, was on neurontin and lyrica for neuropathy.  - will order  neurontin 300mg  tid for now until further f/u  Return in about 2 weeks (around 05/12/2015).    Exam was cut off short due to Pt's confusion and phone call interruptions, I was able to speak with her a bit more after her interview w/ Roselyn Reef (SW).    The patient was given clear instructions to go to ER or return to medical center if symptoms don't improve, worsen or new problems develop. The patient verbalized understanding. The patient was told to call to get lab results if they haven't heard anything in the next week.      Maren Reamer, MD, [place], [place]   04/28/2015, 2:01 PM

## 2015-04-28 NOTE — Patient Instructions (Signed)
Generalized Anxiety Disorder Generalized anxiety disorder (GAD) is a mental disorder. It interferes with life functions, including relationships, work, and school. GAD is different from normal anxiety, which everyone experiences at some point in their lives in response to specific life events and activities. Normal anxiety actually helps [place] prepare for and get through these life events and activities. Normal anxiety goes away after the event or activity is over.  GAD causes anxiety that is not necessarily related to specific events or activities. It also causes excess anxiety in proportion to specific events or activities. The anxiety associated with GAD is also difficult to control. GAD can vary from mild to severe. People with severe GAD can have intense waves of anxiety with physical symptoms (panic attacks).  SYMPTOMS The anxiety and worry associated with GAD are difficult to control. This anxiety and worry are related to many life events and activities and also occur more days than not for 6 months or longer. People with GAD also have three or more of the following symptoms (one or more in children):  Restlessness.   Fatigue.  Difficulty concentrating.   Irritability.  Muscle tension.  Difficulty sleeping or unsatisfying sleep. DIAGNOSIS GAD is diagnosed through an assessment by your health care provider. Your health care provider will ask you questions aboutyour mood,physical symptoms, and events in your life. Your health care provider may ask you about your medical history and use of alcohol or drugs, including prescription medicines. Your health care provider may also do a physical exam and blood tests. Certain medical conditions and the use of certain substances can cause symptoms similar to those associated with GAD. Your health care provider may refer you to a mental health specialist for further evaluation. TREATMENT The following therapies are usually used to treat GAD:    Medication. Antidepressant medication usually is prescribed for long-term daily control. Antianxiety medicines may be added in severe cases, especially when panic attacks occur.   Talk therapy (psychotherapy). Certain types of talk therapy can be helpful in treating GAD by providing support, education, and guidance. A form of talk therapy called cognitive behavioral therapy can teach you healthy ways to think about and react to daily life events and activities.  Stress managementtechniques. These include yoga, meditation, and exercise and can be very helpful when they are practiced regularly. A mental health specialist can help determine which treatment is best for you. Some people see improvement with one therapy. However, other people require a combination of therapies.   This information is not intended to replace advice given to you by your health care provider. Make sure you discuss any questions you have with your health care provider.   Document Released: 04/22/2012 Document Revised: 01/16/2014 Document Reviewed: 04/22/2012 Elsevier Interactive Patient Education 2016 [place] DASH stands for "Dietary Approaches to Stop Hypertension." The DASH eating plan is a healthy eating plan that has been shown to reduce high blood pressure (hypertension). Additional health benefits may include reducing the risk of type 2 diabetes mellitus, heart disease, and stroke. The DASH eating plan may also help with weight loss. WHAT DO I NEED TO KNOW ABOUT THE DASH EATING PLAN? For the DASH eating plan, you will follow these general guidelines:  Choose foods with a percent daily value for sodium of less than 5% (as listed on the food label).  Use salt-free seasonings or herbs instead of table salt or sea salt.  Check with your health care provider or pharmacist before using  salt substitutes.  Eat lower-sodium products, often labeled as "lower sodium" or "no salt  added."  Eat fresh foods.  Eat more vegetables, fruits, and low-fat dairy products.  Choose whole grains. Look for the word "whole" as the first word in the ingredient list.  Choose fish and skinless chicken or [place] more often than red meat. Limit fish, poultry, and meat to 6 oz (170 g) each day.  Limit sweets, desserts, sugars, and sugary drinks.  Choose heart-healthy fats.  Limit cheese to 1 oz (28 g) per day.  Eat more home-cooked food and less restaurant, buffet, and fast food.  Limit fried foods.  Cook foods using methods other than frying.  Limit canned vegetables. If you do use them, rinse them well to decrease the sodium.  When eating at a restaurant, ask that your food be prepared with less salt, or no salt if possible. WHAT FOODS CAN I EAT? Seek help from a dietitian for individual calorie needs. Grains Whole grain or whole wheat bread. Brown rice. Whole grain or whole wheat pasta. Quinoa, bulgur, and whole grain cereals. Low-sodium cereals. Corn or whole wheat flour tortillas. Whole grain cornbread. Whole grain crackers. Low-sodium crackers. Vegetables Fresh or frozen vegetables (raw, steamed, roasted, or grilled). Low-sodium or reduced-sodium tomato and vegetable juices. Low-sodium or reduced-sodium tomato sauce and paste. Low-sodium or reduced-sodium canned vegetables.  Fruits All fresh, canned (in natural juice), or frozen fruits. Meat and Other Protein Products Ground beef (85% or leaner), grass-fed beef, or beef trimmed of fat. Skinless chicken or [place]. Ground chicken or [place]. Pork trimmed of fat. All fish and seafood. Eggs. Dried beans, peas, or lentils. Unsalted nuts and seeds. Unsalted canned beans. Dairy Low-fat dairy products, such as skim or 1% milk, 2% or reduced-fat cheeses, low-fat ricotta or cottage cheese, or plain low-fat yogurt. Low-sodium or reduced-sodium cheeses. Fats and Oils Tub margarines without trans fats. Light or reduced-fat  mayonnaise and salad dressings (reduced sodium). Avocado. Safflower, olive, or canola oils. Natural peanut or almond butter. Other Unsalted popcorn and pretzels. The items listed above may not be a complete list of recommended foods or beverages. Contact your dietitian for more options. WHAT FOODS ARE NOT RECOMMENDED? Grains White bread. White pasta. White rice. Refined cornbread. Bagels and croissants. Crackers that contain trans fat. Vegetables Creamed or fried vegetables. Vegetables in a cheese sauce. Regular canned vegetables. Regular canned tomato sauce and paste. Regular tomato and vegetable juices. Fruits Dried fruits. Canned fruit in light or heavy syrup. Fruit juice. Meat and Other Protein Products Fatty cuts of meat. Ribs, chicken wings, bacon, sausage, bologna, salami, chitterlings, fatback, hot dogs, bratwurst, and packaged luncheon meats. Salted nuts and seeds. Canned beans with salt. Dairy Whole or 2% milk, cream, half-and-half, and cream cheese. Whole-fat or sweetened yogurt. Full-fat cheeses or blue cheese. Nondairy creamers and whipped toppings. Processed cheese, cheese spreads, or cheese curds. Condiments Onion and garlic salt, seasoned salt, table salt, and sea salt. Canned and packaged gravies. Worcestershire sauce. Tartar sauce. Barbecue sauce. Teriyaki sauce. Soy sauce, including reduced sodium. Steak sauce. Fish sauce. Oyster sauce. Cocktail sauce. Horseradish. Ketchup and mustard. Meat flavorings and tenderizers. Bouillon cubes. Hot sauce. Tabasco sauce. Marinades. Taco seasonings. Relishes. Fats and Oils Butter, stick margarine, lard, shortening, ghee, and bacon fat. Coconut, palm kernel, or palm oils. Regular salad dressings. Other Pickles and olives. Salted popcorn and pretzels. The items listed above may not be a complete list of foods and beverages to avoid. Contact your dietitian for more information.  WHERE CAN I FIND MORE INFORMATION? National Heart, Lung, and  Blood Institute: travelstabloid.com   This information is not intended to replace advice given to you by your health care provider. Make sure you discuss any questions you have with your health care provider.   Document Released: 12/15/2010 Document Revised: 01/16/2014 Document Reviewed: 10/30/2012 Elsevier Interactive Patient Education [employer].

## 2015-04-28 NOTE — Progress Notes (Signed)
ASSESSMENT: Pt experiencing Bipolar affective disorder, mixed, severe, with psychotic features, although psychotic features not witnessed in current office visit. Pt needs to f/u with PCP and establish care with psychiatry. Pt would benefit from brief therapeutic interventions and may benefit from psychoeducation regarding coping with symptoms of anxiety and depression and community resources.  Stage of Change: precontemplative  PLAN: 1. F/U with behavioral health consultant in as needed 2. Psychiatric Medications: Seroquel 3. Behavioral recommendation(s):   -Consider [facility] walk-in clinic for [facility] care -Consider contacting mobile crisis number, as needed -Consider reading educational material regarding coping with symptoms of anxiety and depression -Consider community resources discussed in office visit today  SUBJECTIVE: Pt. referred by Dr Janne Napoleon for recent SI/symptoms of anxiety and depression:  Pt. reports the following symptoms/concerns: Pt denies any SI or auditory hallucinations today, pt says that her primary concern today is being able to obtain her BH medications, as she does not know where to go in [place]; moved from [place] 4 weeks prior, discharged from IVC at [facility] six days prior.  Duration of problem: Less than one week Severity: severe  OBJECTIVE: Orientation & Cognition: Oriented x3. Thought processes normal and appropriate to situation. Mood: appropriate. Affect: appropriate Appearance: appropriate Risk of harm to self or others: low risk of harm to self today, no SI today; no known risk of harm to others Substance use: tobacco Assessments administered: PHQ9: 25/ GAD7: 21  Diagnosis: Bipolar 1 disorder, mixed, severe CPT Code: F31.63 -------------------------------------------- Other(s) present in the room: none  Time spent with patient in exam room: 30 minutes, 1:00-1:30pm   Depression screen PHQ 2/9 04/28/2015  Decreased Interest 3  Down,  Depressed, Hopeless 3  PHQ - 2 Score 6  Altered sleeping 3  Tired, decreased energy 3  Change in appetite 3  Feeling bad or failure about yourself  3  Trouble concentrating 3  Moving slowly or fidgety/restless 3  Suicidal thoughts 1  PHQ-9 Score 25  Difficult doing work/chores Extremely dIfficult    GAD 7 : Generalized Anxiety Score 04/28/2015  Nervous, Anxious, on Edge 3  Control/stop worrying 3  Worry too much - different things 3  Trouble relaxing 3  Restless 3  Easily annoyed or irritable 3  Afraid - awful might happen 3  Total GAD 7 Score 21  Anxiety Difficulty Very difficult

## 2015-04-30 ENCOUNTER — Emergency Department (HOSPITAL_COMMUNITY)
Admission: EM | Admit: 2015-04-30 | Discharge: 2015-05-01 | Disposition: A | Discharge status: Home / Self Care | Payer: Medicare Other | Attending: Emergency Medicine | Admitting: Emergency Medicine

## 2015-04-30 ENCOUNTER — Telehealth: Payer: Self-pay | Admitting: Internal Medicine

## 2015-04-30 ENCOUNTER — Encounter (HOSPITAL_COMMUNITY): Payer: Self-pay

## 2015-04-30 ENCOUNTER — Telehealth: Payer: Self-pay | Admitting: Clinical

## 2015-04-30 ENCOUNTER — Emergency Department (HOSPITAL_COMMUNITY): Payer: Medicare Other

## 2015-04-30 DIAGNOSIS — W290XXA Contact with powered kitchen appliance, initial encounter: Secondary | ICD-10-CM | POA: Diagnosis not present

## 2015-04-30 DIAGNOSIS — Z7984 Long term (current) use of oral hypoglycemic drugs: Secondary | ICD-10-CM | POA: Insufficient documentation

## 2015-04-30 DIAGNOSIS — Y9289 Other specified places as the place of occurrence of the external cause: Secondary | ICD-10-CM | POA: Diagnosis not present

## 2015-04-30 DIAGNOSIS — Z79899 Other long term (current) drug therapy: Secondary | ICD-10-CM | POA: Insufficient documentation

## 2015-04-30 DIAGNOSIS — Z7982 Long term (current) use of aspirin: Secondary | ICD-10-CM | POA: Diagnosis not present

## 2015-04-30 DIAGNOSIS — E039 Hypothyroidism, unspecified: Secondary | ICD-10-CM | POA: Insufficient documentation

## 2015-04-30 DIAGNOSIS — M79602 Pain in left arm: Secondary | ICD-10-CM | POA: Insufficient documentation

## 2015-04-30 DIAGNOSIS — Z131 Encounter for screening for diabetes mellitus: Secondary | ICD-10-CM

## 2015-04-30 DIAGNOSIS — R079 Chest pain, unspecified: Secondary | ICD-10-CM | POA: Diagnosis not present

## 2015-04-30 DIAGNOSIS — I10 Essential (primary) hypertension: Secondary | ICD-10-CM | POA: Insufficient documentation

## 2015-04-30 DIAGNOSIS — E119 Type 2 diabetes mellitus without complications: Secondary | ICD-10-CM | POA: Diagnosis not present

## 2015-04-30 DIAGNOSIS — Y998 Other external cause status: Secondary | ICD-10-CM | POA: Diagnosis not present

## 2015-04-30 DIAGNOSIS — T754XXA Electrocution, initial encounter: Secondary | ICD-10-CM

## 2015-04-30 DIAGNOSIS — E669 Obesity, unspecified: Secondary | ICD-10-CM | POA: Diagnosis not present

## 2015-04-30 DIAGNOSIS — Y9389 Activity, other specified: Secondary | ICD-10-CM | POA: Diagnosis not present

## 2015-04-30 DIAGNOSIS — F17210 Nicotine dependence, cigarettes, uncomplicated: Secondary | ICD-10-CM | POA: Insufficient documentation

## 2015-04-30 LAB — CBC
HEMATOCRIT: 37.6 % (ref 36.0–46.0)
Hemoglobin: 12 g/dL (ref 12.0–15.0)
MCH: 28 pg (ref 26.0–34.0)
MCHC: 31.9 g/dL (ref 30.0–36.0)
MCV: 87.9 fL (ref 78.0–100.0)
PLATELETS: 392 10*3/uL (ref 150–400)
RBC: 4.28 MIL/uL (ref 3.87–5.11)
RDW: 14.5 % (ref 11.5–15.5)
WBC: 8.7 10*3/uL (ref 4.0–10.5)

## 2015-04-30 LAB — BASIC METABOLIC PANEL
Anion gap: 11 (ref 5–15)
BUN: 7 mg/dL (ref 6–20)
CO2: 24 mmol/L (ref 22–32)
CREATININE: 0.69 mg/dL (ref 0.44–1.00)
Calcium: 9.6 mg/dL (ref 8.9–10.3)
Chloride: 106 mmol/L (ref 101–111)
GFR calc Af Amer: >60 mL/min (ref >60)
GLUCOSE: 123 mg/dL — AB (ref 65–99)
POTASSIUM: 4.2 mmol/L (ref 3.5–5.1)
Sodium: 141 mmol/L (ref 135–145)

## 2015-04-30 LAB — I-STAT TROPONIN, ED
Troponin i, poc: 0 ng/mL (ref 0.00–0.08)
Troponin i, poc: 0 ng/mL (ref 0.00–0.08)

## 2015-04-30 MED ORDER — MORPHINE SULFATE (PF) 4 MG/ML IV SOLN
4.0000 mg | Freq: Once | INTRAVENOUS | Status: AC
  Administered 2015-04-30: 4 mg via INTRAVENOUS
  Filled 2015-04-30: qty 1

## 2015-04-30 MED ORDER — OXYCODONE-ACETAMINOPHEN 5-325 MG PO TABS: 1.0000 | ORAL_TABLET | Freq: Four times a day (QID) | ORAL | Status: DC | PRN

## 2015-04-30 NOTE — Telephone Encounter (Signed)
Pt. Called requesting to be referred out to the optometrist. Pt. Would like to go to [facility].   Phone- [phone]  Please f/u
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
## 2015-04-30 NOTE — Discharge Instructions (Signed)
Electric Shock Injury Electricity passing through your body can damage your skin and internal organs. Even just 50 volts of electricity may be enough to disrupt your heart's rhythm. A strong electric shock (high voltage) can harm your heart, muscles, and brain.  Household electricity usually ranges from 110-240 volts of alternating current. Most electric shock injuries that cause serious damage to the body are from a shock that is greater than 600 volts. A high-tension wire may be 100,000 volts or more. The severity of your injury depends on several factors, such as the voltage and the length of contact.  CAUSES Common causes of electrical injuries include:  Contact with electricity from wires or appliances in the home.  Children chewing on electric cords or playing with electric outlets.  Getting hit by lightning.  Getting an electrical injury at work.  Being injured by electricity from a high-voltage power line. SIGNS AND SYMPTOMS Signs and symptoms of electric shock may include:  Tingling and numbness.  Very bad pain.  Muscle spasms.  Skin burns (thermal burns).  Broken bones.  Head trauma.  Chest pain.  Heart palpitations.  Trouble breathing.  Headache.  Confusion.  Loss of memory.  Seizure. DIAGNOSIS  Your health care provider will diagnose electric shock injury based on your symptoms and your history of receiving a shock. Your health care provider will also do a physical exam. This may include tests to determine how badly you have been injured. You may have:  Blood tests to check:  Your blood cell counts (CBC).  The minerals in your blood (electrolyte panel).  For muscle or kidney damage.  The oxygen level of your blood.  Electrocardiogram (ECG).  Imaging studies including:  X-rays of your chest or spine or both.  Scans of your internal organs, including ultrasound and CT scans. TREATMENT  Treatment for electric shock injuries depends on the type  of injury you have. Emergency treatment may include:  Intravenous (IV) fluids and medicines to support blood pressure.  Oxygen and breathing support if necessary.  Burn care.  Keeping the neck and spine from moving if there is any chance of a spine fracture.  Care for any broken bones or head injuries.  Long-term treatment may include surgery to treat broken bones or severe burns. HOME CARE INSTRUCTIONS How you care for yourself at home after an electric shock injury will depend on the injuries you have sustained. Follow closely any directions given to you by your emergency room or hospital health care provider. Be sure to:  Keep all your follow-up visits as directed by your health care provider. This is important.  Take medicines only as directed by your health care provider. PREVENTION  To prevent electric shock injuries in the future:  Follow the manufacturer's instructions and precautions when using a home electric appliance.  Keep electrical appliances away from the tub or shower.  Keep electric cords out of reach of children.  Do not touch wet surfaces, faucets, or water pipes while using an electric appliance.  Use safety plugs in all electric outlets. SEEK MEDICAL CARE IF:   You have new symptoms.  Your symptoms change. SEEK IMMEDIATE MEDICAL CARE IF:  You have a seizure.  You have chest pain.  You have trouble breathing. MAKE SURE YOU:   Understand these instructions.  Will watch your condition.  Will get help right away if you are not doing well or get worse.   This information is not intended to replace advice given to you by  your health care provider. Make sure you discuss any questions you have with your health care provider.   Document Released: 12/29/2002 Document Revised: 01/16/2014 Document Reviewed: 03/24/2013 Elsevier Interactive Patient Education [employer].
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
## 2015-05-01 NOTE — ED Notes (Signed)
Pt stable, ambulatory, states understanding of discharge instructions 

## 2015-05-18 ENCOUNTER — Other Ambulatory Visit: Payer: Self-pay | Admitting: Internal Medicine

## 2015-06-09 ENCOUNTER — Ambulatory Visit: Payer: Medicare Other | Admitting: Gastroenterology

## 2015-06-09 ENCOUNTER — Encounter (INDEPENDENT_AMBULATORY_CARE_PROVIDER_SITE_OTHER): Payer: Medicare Other | Admitting: Podiatry

## 2015-06-09 NOTE — Progress Notes (Signed)
This encounter was created in error - please disregard.

## 2015-06-17 NOTE — Telephone Encounter (Signed)
Will forward referral request to PCP.

## 2015-06-21 NOTE — Telephone Encounter (Signed)
i put in referral for opth.

## 2015-07-13 ENCOUNTER — Observation Stay (HOSPITAL_COMMUNITY)
Admission: EM | Admit: 2015-07-13 | Discharge: 2015-07-16 | Disposition: A | Discharge status: Other Acute Inpatient Hospital | Payer: Medicare Other | Attending: Family Medicine | Admitting: Family Medicine

## 2015-07-13 ENCOUNTER — Encounter (HOSPITAL_COMMUNITY): Payer: Self-pay | Admitting: *Deleted

## 2015-07-13 DIAGNOSIS — F3163 Bipolar disorder, current episode mixed, severe, without psychotic features: Secondary | ICD-10-CM | POA: Insufficient documentation

## 2015-07-13 DIAGNOSIS — E1165 Type 2 diabetes mellitus with hyperglycemia: Secondary | ICD-10-CM | POA: Diagnosis not present

## 2015-07-13 DIAGNOSIS — E876 Hypokalemia: Secondary | ICD-10-CM | POA: Insufficient documentation

## 2015-07-13 DIAGNOSIS — Z7982 Long term (current) use of aspirin: Secondary | ICD-10-CM | POA: Insufficient documentation

## 2015-07-13 DIAGNOSIS — F1210 Cannabis abuse, uncomplicated: Secondary | ICD-10-CM | POA: Insufficient documentation

## 2015-07-13 DIAGNOSIS — R05 Cough: Secondary | ICD-10-CM | POA: Insufficient documentation

## 2015-07-13 DIAGNOSIS — E1140 Type 2 diabetes mellitus with diabetic neuropathy, unspecified: Secondary | ICD-10-CM | POA: Diagnosis not present

## 2015-07-13 DIAGNOSIS — E039 Hypothyroidism, unspecified: Secondary | ICD-10-CM | POA: Diagnosis not present

## 2015-07-13 DIAGNOSIS — M549 Dorsalgia, unspecified: Secondary | ICD-10-CM | POA: Diagnosis not present

## 2015-07-13 DIAGNOSIS — F17210 Nicotine dependence, cigarettes, uncomplicated: Secondary | ICD-10-CM | POA: Insufficient documentation

## 2015-07-13 DIAGNOSIS — G8929 Other chronic pain: Secondary | ICD-10-CM | POA: Insufficient documentation

## 2015-07-13 DIAGNOSIS — T424X2A Poisoning by benzodiazepines, intentional self-harm, initial encounter: Principal | ICD-10-CM | POA: Insufficient documentation

## 2015-07-13 DIAGNOSIS — Z7984 Long term (current) use of oral hypoglycemic drugs: Secondary | ICD-10-CM | POA: Insufficient documentation

## 2015-07-13 DIAGNOSIS — I10 Essential (primary) hypertension: Secondary | ICD-10-CM | POA: Diagnosis not present

## 2015-07-13 DIAGNOSIS — Z79899 Other long term (current) drug therapy: Secondary | ICD-10-CM | POA: Insufficient documentation

## 2015-07-13 DIAGNOSIS — T50901A Poisoning by unspecified drugs, medicaments and biological substances, accidental (unintentional), initial encounter: Secondary | ICD-10-CM | POA: Diagnosis present

## 2015-07-13 DIAGNOSIS — F1410 Cocaine abuse, uncomplicated: Secondary | ICD-10-CM | POA: Insufficient documentation

## 2015-07-13 DIAGNOSIS — R0682 Tachypnea, not elsewhere classified: Secondary | ICD-10-CM

## 2015-07-13 DIAGNOSIS — F329 Major depressive disorder, single episode, unspecified: Secondary | ICD-10-CM | POA: Diagnosis present

## 2015-07-13 DIAGNOSIS — Z79891 Long term (current) use of opiate analgesic: Secondary | ICD-10-CM | POA: Insufficient documentation

## 2015-07-13 DIAGNOSIS — R45851 Suicidal ideations: Secondary | ICD-10-CM

## 2015-07-13 DIAGNOSIS — T1491XA Suicide attempt, initial encounter: Secondary | ICD-10-CM | POA: Insufficient documentation

## 2015-07-13 LAB — COMPREHENSIVE METABOLIC PANEL
ALT: 18 U/L (ref 14–54)
AST: 21 U/L (ref 15–41)
Albumin: 4.5 g/dL (ref 3.5–5.0)
Alkaline Phosphatase: 79 U/L (ref 38–126)
Anion gap: 9 (ref 5–15)
BILIRUBIN TOTAL: 1.3 mg/dL — AB (ref 0.3–1.2)
BUN: 10 mg/dL (ref 6–20)
CHLORIDE: 104 mmol/L (ref 101–111)
CO2: 24 mmol/L (ref 22–32)
CREATININE: 0.88 mg/dL (ref 0.44–1.00)
Calcium: 9.6 mg/dL (ref 8.9–10.3)
GFR calc Af Amer: >60 mL/min (ref >60)
GLUCOSE: 119 mg/dL — AB (ref 65–99)
Potassium: 2.7 mmol/L — CL (ref 3.5–5.1)
Sodium: 137 mmol/L (ref 135–145)
Total Protein: 8.5 g/dL — ABNORMAL HIGH (ref 6.5–8.1)

## 2015-07-13 LAB — CBG MONITORING, ED: Glucose-Capillary: 125 mg/dL — ABNORMAL HIGH (ref 65–99)

## 2015-07-13 LAB — CBC
HEMATOCRIT: 40.9 % (ref 36.0–46.0)
Hemoglobin: 14.1 g/dL (ref 12.0–15.0)
MCH: 29.1 pg (ref 26.0–34.0)
MCHC: 34.5 g/dL (ref 30.0–36.0)
MCV: 84.5 fL (ref 78.0–100.0)
Platelets: 433 10*3/uL — ABNORMAL HIGH (ref 150–400)
RBC: 4.84 MIL/uL (ref 3.87–5.11)
RDW: 14.5 % (ref 11.5–15.5)
WBC: 12.2 10*3/uL — ABNORMAL HIGH (ref 4.0–10.5)

## 2015-07-13 LAB — ACETAMINOPHEN LEVEL: Acetaminophen (Tylenol), Serum: <10 ug/mL — ABNORMAL LOW (ref 10–30)

## 2015-07-13 LAB — ETHANOL

## 2015-07-13 LAB — SALICYLATE LEVEL: Salicylate Lvl: <4 mg/dL (ref 2.8–30.0)

## 2015-07-13 MED ORDER — PANTOPRAZOLE SODIUM 40 MG PO TBEC
40.0000 mg | DELAYED_RELEASE_TABLET | Freq: Every day | ORAL | Status: DC
  Administered 2015-07-14 – 2015-07-16 (×3): 40 mg via ORAL
  Filled 2015-07-13 (×4): qty 1

## 2015-07-13 MED ORDER — LITHIUM CARBONATE 300 MG PO CAPS: 300.0000 mg | ORAL_CAPSULE | Freq: Two times a day (BID) | ORAL | Status: DC

## 2015-07-13 MED ORDER — PREGABALIN 50 MG PO CAPS
50.0000 mg | ORAL_CAPSULE | Freq: Three times a day (TID) | ORAL | Status: DC
  Administered 2015-07-14 – 2015-07-16 (×6): 50 mg via ORAL
  Filled 2015-07-13 (×7): qty 1

## 2015-07-13 MED ORDER — POTASSIUM CHLORIDE 10 MEQ/100ML IV SOLN
10.0000 meq | INTRAVENOUS | Status: AC
  Administered 2015-07-14 (×4): 10 meq via INTRAVENOUS
  Filled 2015-07-13: qty 100

## 2015-07-13 MED ORDER — LEVOTHYROXINE SODIUM 25 MCG PO TABS
25.0000 ug | ORAL_TABLET | Freq: Every day | ORAL | Status: DC
  Administered 2015-07-14 – 2015-07-16 (×3): 25 ug via ORAL
  Filled 2015-07-13 (×5): qty 1

## 2015-07-13 MED ORDER — NICOTINE 21 MG/24HR TD PT24
21.0000 mg | MEDICATED_PATCH | Freq: Every day | TRANSDERMAL | Status: DC
  Administered 2015-07-14 – 2015-07-16 (×3): 21 mg via TRANSDERMAL
  Filled 2015-07-13 (×3): qty 1

## 2015-07-13 MED ORDER — SODIUM CHLORIDE 0.9 % IV SOLN
INTRAVENOUS | Status: DC
  Administered 2015-07-13: 1000 mL via INTRAVENOUS

## 2015-07-13 MED ORDER — GABAPENTIN 300 MG PO CAPS: 300.0000 mg | ORAL_CAPSULE | Freq: Three times a day (TID) | ORAL | Status: DC

## 2015-07-13 MED ORDER — ASPIRIN EC 81 MG PO TBEC
81.0000 mg | DELAYED_RELEASE_TABLET | Freq: Every day | ORAL | Status: DC
  Administered 2015-07-14 – 2015-07-16 (×3): 81 mg via ORAL
  Filled 2015-07-13 (×3): qty 1

## 2015-07-13 MED ORDER — QUETIAPINE FUMARATE 100 MG PO TABS
300.0000 mg | ORAL_TABLET | Freq: Every day | ORAL | Status: DC
  Administered 2015-07-14 – 2015-07-15 (×2): 300 mg via ORAL
  Filled 2015-07-13 (×2): qty 3

## 2015-07-13 MED ORDER — VERAPAMIL HCL 40 MG PO TABS
40.0000 mg | ORAL_TABLET | Freq: Two times a day (BID) | ORAL | Status: DC
  Administered 2015-07-14 – 2015-07-15 (×3): 40 mg via ORAL
  Filled 2015-07-13 (×6): qty 1

## 2015-07-13 MED ORDER — METFORMIN HCL 500 MG PO TABS
500.0000 mg | ORAL_TABLET | Freq: Every day | ORAL | Status: DC
  Filled 2015-07-13: qty 1

## 2015-07-13 NOTE — ED Notes (Signed)
Pt remains on telemetry & continuous pulse oximetry
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
## 2015-07-14 ENCOUNTER — Encounter (HOSPITAL_COMMUNITY): Payer: Self-pay | Admitting: Internal Medicine

## 2015-07-14 DIAGNOSIS — M549 Dorsalgia, unspecified: Secondary | ICD-10-CM

## 2015-07-14 DIAGNOSIS — F1410 Cocaine abuse, uncomplicated: Secondary | ICD-10-CM | POA: Diagnosis not present

## 2015-07-14 DIAGNOSIS — T50902A Poisoning by unspecified drugs, medicaments and biological substances, intentional self-harm, initial encounter: Secondary | ICD-10-CM | POA: Diagnosis not present

## 2015-07-14 DIAGNOSIS — F1210 Cannabis abuse, uncomplicated: Secondary | ICD-10-CM | POA: Diagnosis present

## 2015-07-14 DIAGNOSIS — G8929 Other chronic pain: Secondary | ICD-10-CM | POA: Diagnosis present

## 2015-07-14 DIAGNOSIS — T1491XA Suicide attempt, initial encounter: Secondary | ICD-10-CM | POA: Insufficient documentation

## 2015-07-14 DIAGNOSIS — T1491 Suicide attempt: Secondary | ICD-10-CM | POA: Diagnosis not present

## 2015-07-14 DIAGNOSIS — T424X2A Poisoning by benzodiazepines, intentional self-harm, initial encounter: Secondary | ICD-10-CM | POA: Diagnosis not present

## 2015-07-14 DIAGNOSIS — T50901A Poisoning by unspecified drugs, medicaments and biological substances, accidental (unintentional), initial encounter: Secondary | ICD-10-CM | POA: Diagnosis present

## 2015-07-14 DIAGNOSIS — R45851 Suicidal ideations: Secondary | ICD-10-CM

## 2015-07-14 DIAGNOSIS — F3163 Bipolar disorder, current episode mixed, severe, without psychotic features: Secondary | ICD-10-CM | POA: Diagnosis not present

## 2015-07-14 DIAGNOSIS — I10 Essential (primary) hypertension: Secondary | ICD-10-CM | POA: Diagnosis present

## 2015-07-14 DIAGNOSIS — T50902D Poisoning by unspecified drugs, medicaments and biological substances, intentional self-harm, subsequent encounter: Secondary | ICD-10-CM

## 2015-07-14 DIAGNOSIS — E876 Hypokalemia: Secondary | ICD-10-CM | POA: Diagnosis present

## 2015-07-14 LAB — BASIC METABOLIC PANEL
ANION GAP: 6 (ref 5–15)
BUN: 8 mg/dL (ref 6–20)
CALCIUM: 8.7 mg/dL — AB (ref 8.9–10.3)
CO2: 23 mmol/L (ref 22–32)
Chloride: 110 mmol/L (ref 101–111)
Creatinine, Ser: 0.77 mg/dL (ref 0.44–1.00)
GLUCOSE: 98 mg/dL (ref 65–99)
Potassium: 3.4 mmol/L — ABNORMAL LOW (ref 3.5–5.1)
Sodium: 139 mmol/L (ref 135–145)

## 2015-07-14 LAB — GLUCOSE, CAPILLARY
GLUCOSE-CAPILLARY: 116 mg/dL — AB (ref 65–99)
Glucose-Capillary: 106 mg/dL — ABNORMAL HIGH (ref 65–99)
Glucose-Capillary: 94 mg/dL (ref 65–99)
Glucose-Capillary: 81 mg/dL (ref 65–99)
Glucose-Capillary: 70 mg/dL (ref 65–99)

## 2015-07-14 LAB — LITHIUM LEVEL: Lithium Lvl: <0.06 mmol/L — ABNORMAL LOW (ref 0.60–1.20)

## 2015-07-14 LAB — CBC WITH DIFFERENTIAL/PLATELET
BASOS ABS: 0 10*3/uL (ref 0.0–0.1)
BASOS PCT: 0 %
EOS ABS: 0.1 10*3/uL (ref 0.0–0.7)
Eosinophils Relative: 1 %
HCT: 41.9 % (ref 36.0–46.0)
HEMOGLOBIN: 13.7 g/dL (ref 12.0–15.0)
Lymphocytes Relative: 32 %
Lymphs Abs: 3.5 10*3/uL (ref 0.7–4.0)
MCH: 28.5 pg (ref 26.0–34.0)
MCHC: 32.7 g/dL (ref 30.0–36.0)
MCV: 87.3 fL (ref 78.0–100.0)
Monocytes Absolute: 1.5 10*3/uL — ABNORMAL HIGH (ref 0.1–1.0)
Monocytes Relative: 14 %
NEUTROS PCT: 53 %
Neutro Abs: 5.9 10*3/uL (ref 1.7–7.7)
Platelets: 412 10*3/uL — ABNORMAL HIGH (ref 150–400)
RBC: 4.8 MIL/uL (ref 3.87–5.11)
RDW: 14.8 % (ref 11.5–15.5)
WBC: 11 10*3/uL — AB (ref 4.0–10.5)

## 2015-07-14 LAB — COMPREHENSIVE METABOLIC PANEL
ALK PHOS: 75 U/L (ref 38–126)
ALT: 16 U/L (ref 14–54)
AST: 21 U/L (ref 15–41)
Albumin: 4.2 g/dL (ref 3.5–5.0)
Anion gap: 8 (ref 5–15)
BUN: 9 mg/dL (ref 6–20)
CALCIUM: 9.4 mg/dL (ref 8.9–10.3)
CHLORIDE: 108 mmol/L (ref 101–111)
CO2: 24 mmol/L (ref 22–32)
CREATININE: 0.84 mg/dL (ref 0.44–1.00)
Glucose, Bld: 105 mg/dL — ABNORMAL HIGH (ref 65–99)
Potassium: 3.4 mmol/L — ABNORMAL LOW (ref 3.5–5.1)
Sodium: 140 mmol/L (ref 135–145)
TOTAL PROTEIN: 7.8 g/dL (ref 6.5–8.1)
Total Bilirubin: 1.2 mg/dL (ref 0.3–1.2)

## 2015-07-14 LAB — RAPID URINE DRUG SCREEN, HOSP PERFORMED
Amphetamines: NOT DETECTED
BARBITURATES: NOT DETECTED
Benzodiazepines: POSITIVE — AB
Cocaine: POSITIVE — AB
OPIATES: NOT DETECTED
TETRAHYDROCANNABINOL: POSITIVE — AB

## 2015-07-14 LAB — MAGNESIUM: MAGNESIUM: 2 mg/dL (ref 1.7–2.4)

## 2015-07-14 LAB — CK: Total CK: 277 U/L — ABNORMAL HIGH (ref 38–234)

## 2015-07-14 LAB — TROPONIN I: Troponin I: <0.03 ng/mL (ref <0.03)

## 2015-07-14 LAB — TSH: TSH: 0.534 u[IU]/mL (ref 0.350–4.500)

## 2015-07-14 LAB — MRSA PCR SCREENING: MRSA BY PCR: NEGATIVE

## 2015-07-14 MED ORDER — INSULIN ASPART 100 UNIT/ML ~~LOC~~ SOLN: 0.0000 [IU] | Freq: Three times a day (TID) | SUBCUTANEOUS | Status: DC

## 2015-07-14 MED ORDER — LORAZEPAM 1 MG PO TABS
1.0000 mg | ORAL_TABLET | Freq: Once | ORAL | Status: AC
  Administered 2015-07-14: 1 mg via ORAL
  Filled 2015-07-14: qty 1

## 2015-07-14 MED ORDER — ENOXAPARIN SODIUM 40 MG/0.4ML ~~LOC~~ SOLN
40.0000 mg | SUBCUTANEOUS | Status: DC
  Administered 2015-07-14 – 2015-07-16 (×3): 40 mg via SUBCUTANEOUS
  Filled 2015-07-14 (×3): qty 0.4

## 2015-07-14 MED ORDER — ACETAMINOPHEN 325 MG PO TABS
650.0000 mg | ORAL_TABLET | Freq: Four times a day (QID) | ORAL | Status: DC | PRN
  Administered 2015-07-15 – 2015-07-16 (×3): 650 mg via ORAL
  Filled 2015-07-14 (×3): qty 2

## 2015-07-14 MED ORDER — ONDANSETRON HCL 4 MG PO TABS: 4.0000 mg | ORAL_TABLET | Freq: Four times a day (QID) | ORAL | Status: DC | PRN

## 2015-07-14 MED ORDER — POTASSIUM CHLORIDE CRYS ER 20 MEQ PO TBCR
60.0000 meq | EXTENDED_RELEASE_TABLET | Freq: Once | ORAL | Status: DC
  Filled 2015-07-14: qty 3

## 2015-07-14 MED ORDER — ONDANSETRON HCL 4 MG/2ML IJ SOLN: 4.0000 mg | Freq: Four times a day (QID) | INTRAMUSCULAR | Status: DC | PRN

## 2015-07-14 MED ORDER — SODIUM CHLORIDE 0.9 % IV SOLN
INTRAVENOUS | Status: AC
  Administered 2015-07-14 (×2): via INTRAVENOUS

## 2015-07-14 MED ORDER — POTASSIUM CHLORIDE CRYS ER 20 MEQ PO TBCR
40.0000 meq | EXTENDED_RELEASE_TABLET | Freq: Once | ORAL | Status: AC
  Administered 2015-07-14: 40 meq via ORAL
  Filled 2015-07-14: qty 2

## 2015-07-14 NOTE — ED Notes (Signed)
Pt remains on monitor & continuous pulse ox.  Sitter remains @ BS.

## 2015-07-14 NOTE — ED Provider Notes (Signed)
Patient was signed out as pending os in the ED for 6 hours.  Upon my repeat examination, patient is still very drowsy.  She wakes to answer one question then falls asleep.  She will require admission for observation and pysch consult.  I spoke with Dr. Hal Hope who accepts patietn to step down for further care.  Everlene Balls, MD 07/14/15 [phone]
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
## 2015-07-14 NOTE — Progress Notes (Signed)
07/14/2015 7:26 AM  Attending Progress Note   Pt was seen and examined.  H&P, orders reviewed.  Placed psych consult.  Home meds need to be reconciled when patient is more alert.  She is starting to wake up this morning.    Murvin Natal, MD

## 2015-07-14 NOTE — H&P (Addendum)
History and Physical    Melanie Foster MKL:[id number] DOB: [birth year] DOA: 07/13/2015  PCP: Maren Reamer, MD  Patient coming from: Home.  Chief Complaint: Suicidal thoughts.  HPI: Melanie Foster is a 60 y.o. female with bipolar disorder, diabetes mellitus, hypertension, hypothyroidism was brought to the ER after patient called EMS with suicidal thoughts. By the time EMS reached patient took overdose of Xanax. In the ER patient took another dose of Xanax which was in the pocket. Patient denies having taken anything else other than cocaine. Denies any chest pain or shortness of breath. Patient was initially drowsy and poison control advised to monitor patient. On my exam patient is mildly lethargic and still suicidal. The exact dose of Xanax is not known but as per the nurse may be around 12 mg. Patient is admitted for further observation and will need psychiatric consult. In addition patient's labs show hypokalemia.   ED Course: See history of present illness.  Review of Systems: As per HPI, rest all negative.   Past Medical History  Diagnosis Date  . Anxiety   . Hypertension   . Diabetes mellitus without complication ([place])   . Hypercholesteremia   . Hypothyroid   . Nerve damage     both legs    Past Surgical History  Procedure Laterality Date  . Splenectomy Right unknown    per client  . Rotator cuff repair Right unknown    per client, surgery scar noted  . Knee surgery Left unknown    surgical scar noted     reports that she has been smoking Cigarettes.  She has been smoking about 0.50 packs per day. She does not have any smokeless tobacco history on file. She reports that she does not drink alcohol or use illicit drugs.  No Known Allergies  Family History  Problem Relation Age of Onset  . Hypertension Other     Prior to Admission medications   Medication Sig Start Date End Date Taking? Authorizing Provider  aspirin EC 81 MG tablet Take 81 mg by mouth daily at 12 noon.     Historical Provider, MD  blood glucose meter kit and supplies KIT Dispense based on patient and insurance preference. Use up to four times daily as directed. (FOR ICD-9 250.00, 250.01). 04/28/15   Maren Reamer, MD  Blood Pressure Monitoring (BLOOD PRESSURE CUFF) MISC 1 application by Does not apply route daily after breakfast. 04/28/15   Maren Reamer, MD  gabapentin (NEURONTIN) 300 MG capsule Take 1 capsule (300 mg total) by mouth 3 (three) times daily. 04/28/15   Maren Reamer, MD  glucose blood (ONE TOUCH ULTRA TEST) test strip Use as instructed 04/28/15   Maren Reamer, MD  Lancets [facility] ULTRASOFT) lancets Use as instructed 04/28/15   Maren Reamer, MD  levothyroxine (SYNTHROID, LEVOTHROID) 25 MCG tablet Take 1 tablet (25 mcg total) by mouth daily before breakfast. 04/28/15   Maren Reamer, MD  lithium carbonate 300 MG capsule Take 1 capsule (300 mg total) by mouth 2 (two) times daily with a meal. Patient not taking: Reported on 04/30/2015 04/25/15   Derrill Center, NP  LYRICA 50 MG capsule TAKE 1 CAPSULE BY MOUTH THREE TIMES DAILY 05/18/15   Maren Reamer, MD  metFORMIN (GLUCOPHAGE) 500 MG tablet Take 1 tablet (500 mg total) by mouth daily with breakfast. 04/28/15   Maren Reamer, MD  nicotine (NICODERM CQ - DOSED IN MG/24 HOURS) 21 mg/24hr patch Place 1 patch (  21 mg total) onto the skin daily. Patient not taking: Reported on 04/30/2015 04/25/15   Derrill Center, NP  omeprazole (PRILOSEC) 20 MG capsule Take 1 capsule (20 mg total) by mouth daily. 04/28/15   Maren Reamer, MD  oxyCODONE-acetaminophen (PERCOCET/ROXICET) 5-325 MG tablet Take 1 tablet by mouth every 6 (six) hours as needed for severe pain. 04/30/15   Domenic Moras, PA-C  QUEtiapine (SEROQUEL) 300 MG tablet Take 1 tablet (300 mg total) by mouth at bedtime. 04/25/15   Derrill Center, NP  verapamil (CALAN) 40 MG tablet Take 1 tablet (40 mg total) by mouth 2 (two) times daily. Please tell her to stop the [facility] when she  starts this again. 04/28/15   Maren Reamer, MD    Physical Exam: Filed Vitals:   07/14/15 0300 07/14/15 0317 07/14/15 0330 07/14/15 0400  BP: 125/77 125/77 137/107 115/99  Pulse: 88 87 85   Temp:      TempSrc:      Resp: 33 _0 SpO2: 97% 96% 98%       Constitutional: Not in distress. Filed Vitals:   07/14/15 0300 07/14/15 0317 07/14/15 0330 07/14/15 0400  BP: 125/77 125/77 137/107 115/99  Pulse: 88 87 85   Temp:      TempSrc:      Resp: 33 _1 SpO2: 97% 96% 98%    Eyes: Anicteric no pallor. ENMT: No discharge from the ears eyes nose or mouth. Neck: No neck rigidity no mass felt. Respiratory: No rhonchi or crepitations. Cardiovascular: S1-S2 heard. Abdomen: Soft nontender bowel sounds present. Musculoskeletal: No edema. Skin: No rash. Neurologic: Mildly lethargic but awake oriented to time place and person. Moves all extremities. Psychiatric: Suicidal.   Labs on Admission: I have personally reviewed following labs and imaging studies  CBC:  Recent Labs Lab 07/13/15 2245  WBC 12.2*  HGB 14.1  HCT 40.9  MCV 84.5  PLT 161*   Basic Metabolic Panel:  Recent Labs Lab 07/13/15 2245  NA 137  K 2.7*  CL 104  CO2 24  GLUCOSE 119*  BUN 10  CREATININE 0.88  CALCIUM 9.6   GFR: CrCl cannot be calculated (Unknown ideal weight.). Liver Function Tests:  Recent Labs Lab 07/13/15 2245  AST 21  ALT 18  ALKPHOS 79  BILITOT 1.3*  PROT 8.5*  ALBUMIN 4.5   No results for input(s): LIPASE, AMYLASE in the last 168 hours. No results for input(s): AMMONIA in the last 168 hours. Coagulation Profile: No results for input(s): INR, PROTIME in the last 168 hours. Cardiac Enzymes: No results for input(s): CKTOTAL, CKMB, CKMBINDEX, TROPONINI in the last 168 hours. BNP (last 3 results) No results for input(s): PROBNP in the last 8760 hours. HbA1C: No results for input(s): HGBA1C in the last 72 hours. CBG:  Recent Labs Lab 07/13/15 2342  GLUCAP  125*   Lipid Profile: No results for input(s): CHOL, HDL, LDLCALC, TRIG, CHOLHDL, LDLDIRECT in the last 72 hours. Thyroid Function Tests: No results for input(s): TSH, T4TOTAL, FREET4, T3FREE, THYROIDAB in the last 72 hours. Anemia Panel: No results for input(s): VITAMINB12, FOLATE, FERRITIN, TIBC, IRON, RETICCTPCT in the last 72 hours. Urine analysis: No results found for: COLORURINE, APPEARANCEUR, LABSPEC, PHURINE, GLUCOSEU, HGBUR, BILIRUBINUR, KETONESUR, PROTEINUR, UROBILINOGEN, NITRITE, LEUKOCYTESUR Sepsis Labs: _2 (procalcitonin:4,lacticidven:4) )No results found for this or any previous visit (from the past 240 hour(s)).   Radiological Exams on Admission: No results found.  EKG: Independently reviewed. Normal sinus rhythm.  Assessment/Plan  Principal Problem:   Suicidal ideation Active Problems:   Essential hypertension   Type 2 diabetes mellitus with hyperglycemia (HCC)   Hypothyroidism   MDD (major depressive disorder) ([place])   Hypertension   Drug overdose    1. Intentional drug overdose with Xanax and also polysubstance abuse - patient be observed overnight in stepdown. Consult psychiatry in a.m. Will need counseling for drug abuse. She denies taking any other medications and acetaminophen levels and salicylates negative. 2. Hypokalemia - replace and recheck. Check magnesium levels. Not sure what's causing it. 3. Suicidal ideation with history of bipolar disorder - patient is on Seroquel. Consult psychiatry in a.m. Patient is placed on suicide precautions. 4. Diabetes mellitus type 2 - hold metformin while inpatient. Patient is placed on sliding scale coverage. 5. Hypertension - continue verapamil. 6. Hypothyroidism on Synthroid. Check TSH.   DVT prophylaxis: Lovenox. Code Status: Full code.  Family Communication: No family at the bedside.  Disposition Plan: To be determined.  Consults called: None.  Admission status: Observation stepdown.     Rise Patience MD [facility] Pager [phone].  If 7PM-7AM, please contact night-coverage [email or web address] Password [facility]  07/14/2015, 4:43 AM

## 2015-07-14 NOTE — Progress Notes (Signed)
PATIENT HAS MEDICINES IN A BELONGINGS BAG OUTSIDE ROOM 1235

## 2015-07-14 NOTE — Progress Notes (Signed)
LCSWA aware of CSW consult. Will continue to follow and support.  Kathrin Greathouse, Latanya Presser, MSW Clinical Social Worker 5E and Psychiatric Service Line [phone] 07/14/2015  12:31 PM

## 2015-07-14 NOTE — ED Notes (Signed)
Pt remains on monitor, is sleeping, is arouseable by stimuli.  NAD

## 2015-07-14 NOTE — ED Notes (Signed)
Pt remains on monitor, NAD.

## 2015-07-14 NOTE — Consult Note (Signed)
This FNE here to consult on pt. Pt is drowsy at times and crying and c/o chest pain and anxiety. Pt declines evidence collection at this time. Pt stated she didn't want to talk anymore. Offered to follow up with pt tomorrow. RN updated.

## 2015-07-14 NOTE — Progress Notes (Addendum)
Sane Nurse called to report patient is on floor and alert. Will continue to monitor patient.

## 2015-07-14 NOTE — Progress Notes (Signed)
All medications in patient's bag were counted and verified by me and Altha Harm in Pharmacy.  Medications were then sent down to Pharmacy until patient discharge. Katherine Mantle RN
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
## 2015-07-15 ENCOUNTER — Observation Stay (HOSPITAL_COMMUNITY): Payer: Medicare Other

## 2015-07-15 DIAGNOSIS — T424X2A Poisoning by benzodiazepines, intentional self-harm, initial encounter: Secondary | ICD-10-CM | POA: Diagnosis not present

## 2015-07-15 DIAGNOSIS — M549 Dorsalgia, unspecified: Secondary | ICD-10-CM

## 2015-07-15 DIAGNOSIS — E876 Hypokalemia: Secondary | ICD-10-CM

## 2015-07-15 DIAGNOSIS — F1410 Cocaine abuse, uncomplicated: Secondary | ICD-10-CM | POA: Diagnosis not present

## 2015-07-15 DIAGNOSIS — F3163 Bipolar disorder, current episode mixed, severe, without psychotic features: Secondary | ICD-10-CM | POA: Diagnosis not present

## 2015-07-15 DIAGNOSIS — E039 Hypothyroidism, unspecified: Secondary | ICD-10-CM

## 2015-07-15 DIAGNOSIS — T1491 Suicide attempt: Secondary | ICD-10-CM | POA: Diagnosis not present

## 2015-07-15 DIAGNOSIS — E1165 Type 2 diabetes mellitus with hyperglycemia: Secondary | ICD-10-CM

## 2015-07-15 DIAGNOSIS — G8929 Other chronic pain: Secondary | ICD-10-CM

## 2015-07-15 LAB — BASIC METABOLIC PANEL
ANION GAP: 3 — AB (ref 5–15)
BUN: 10 mg/dL (ref 6–20)
CALCIUM: 8.6 mg/dL — AB (ref 8.9–10.3)
CO2: 23 mmol/L (ref 22–32)
Chloride: 115 mmol/L — ABNORMAL HIGH (ref 101–111)
Creatinine, Ser: 0.78 mg/dL (ref 0.44–1.00)
GLUCOSE: 155 mg/dL — AB (ref 65–99)
POTASSIUM: 3.6 mmol/L (ref 3.5–5.1)
SODIUM: 141 mmol/L (ref 135–145)

## 2015-07-15 LAB — CBC WITH DIFFERENTIAL/PLATELET
BASOS ABS: 0 10*3/uL (ref 0.0–0.1)
BASOS PCT: 0 %
EOS ABS: 0.2 10*3/uL (ref 0.0–0.7)
Eosinophils Relative: 2 %
HEMATOCRIT: 37 % (ref 36.0–46.0)
HEMOGLOBIN: 11.8 g/dL — AB (ref 12.0–15.0)
Lymphocytes Relative: 39 %
Lymphs Abs: 3.7 10*3/uL (ref 0.7–4.0)
MCH: 28.4 pg (ref 26.0–34.0)
MCHC: 31.9 g/dL (ref 30.0–36.0)
MCV: 88.9 fL (ref 78.0–100.0)
Monocytes Absolute: 1.2 10*3/uL — ABNORMAL HIGH (ref 0.1–1.0)
Monocytes Relative: 12 %
NEUTROS ABS: 4.6 10*3/uL (ref 1.7–7.7)
NEUTROS PCT: 47 %
Platelets: 397 10*3/uL (ref 150–400)
RBC: 4.16 MIL/uL (ref 3.87–5.11)
RDW: 15.1 % (ref 11.5–15.5)
WBC: 9.6 10*3/uL (ref 4.0–10.5)

## 2015-07-15 LAB — GLUCOSE, CAPILLARY
GLUCOSE-CAPILLARY: 137 mg/dL — AB (ref 65–99)
GLUCOSE-CAPILLARY: 166 mg/dL — AB (ref 65–99)
GLUCOSE-CAPILLARY: 127 mg/dL — AB (ref 65–99)
GLUCOSE-CAPILLARY: 162 mg/dL — AB (ref 65–99)

## 2015-07-15 MED ORDER — LORAZEPAM 1 MG PO TABS
1.0000 mg | ORAL_TABLET | Freq: Once | ORAL | Status: AC
  Administered 2015-07-15: 1 mg via ORAL
  Filled 2015-07-15: qty 1

## 2015-07-15 MED ORDER — LORAZEPAM 0.5 MG PO TABS
0.5000 mg | ORAL_TABLET | Freq: Three times a day (TID) | ORAL | Status: DC | PRN
  Administered 2015-07-15 – 2015-07-16 (×2): 0.5 mg via ORAL
  Filled 2015-07-15 (×2): qty 1

## 2015-07-15 NOTE — Progress Notes (Signed)
MD paged to make aware of tachypnea.
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
## 2015-07-15 NOTE — Care Management Obs Status (Signed)
[facility] NOTIFICATION   Patient Details  Name: Melanie Foster MRN: [id number] Date of Birth: 23-Sep-1955   Medicare Observation Status Notification Given:  Yes    Leeroy Cha, RN 07/15/2015, 11:59 AM
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
## 2015-07-16 DIAGNOSIS — E1140 Type 2 diabetes mellitus with diabetic neuropathy, unspecified: Secondary | ICD-10-CM

## 2015-07-16 DIAGNOSIS — T1491 Suicide attempt: Secondary | ICD-10-CM | POA: Diagnosis not present

## 2015-07-16 DIAGNOSIS — F3163 Bipolar disorder, current episode mixed, severe, without psychotic features: Secondary | ICD-10-CM | POA: Diagnosis not present

## 2015-07-16 DIAGNOSIS — T424X2A Poisoning by benzodiazepines, intentional self-harm, initial encounter: Secondary | ICD-10-CM

## 2015-07-16 DIAGNOSIS — F1410 Cocaine abuse, uncomplicated: Secondary | ICD-10-CM | POA: Diagnosis not present

## 2015-07-16 DIAGNOSIS — M549 Dorsalgia, unspecified: Secondary | ICD-10-CM | POA: Diagnosis not present

## 2015-07-16 LAB — COMPREHENSIVE METABOLIC PANEL
ALBUMIN: 3.1 g/dL — AB (ref 3.5–5.0)
ALT: 15 U/L (ref 14–54)
ANION GAP: 5 (ref 5–15)
AST: 17 U/L (ref 15–41)
Alkaline Phosphatase: 63 U/L (ref 38–126)
BUN: 7 mg/dL (ref 6–20)
CHLORIDE: 111 mmol/L (ref 101–111)
CO2: 24 mmol/L (ref 22–32)
Calcium: 9.1 mg/dL (ref 8.9–10.3)
Creatinine, Ser: 0.78 mg/dL (ref 0.44–1.00)
GFR calc Af Amer: >60 mL/min (ref >60)
GFR calc non Af Amer: >60 mL/min (ref >60)
GLUCOSE: 142 mg/dL — AB (ref 65–99)
POTASSIUM: 3.5 mmol/L (ref 3.5–5.1)
SODIUM: 140 mmol/L (ref 135–145)
TOTAL PROTEIN: 6.6 g/dL (ref 6.5–8.1)
Total Bilirubin: 0.4 mg/dL (ref 0.3–1.2)

## 2015-07-16 LAB — CBC
HCT: 35.4 % — ABNORMAL LOW (ref 36.0–46.0)
Hemoglobin: 11.9 g/dL — ABNORMAL LOW (ref 12.0–15.0)
MCH: 28.8 pg (ref 26.0–34.0)
MCHC: 33.6 g/dL (ref 30.0–36.0)
MCV: 85.7 fL (ref 78.0–100.0)
PLATELETS: 412 10*3/uL — AB (ref 150–400)
RBC: 4.13 MIL/uL (ref 3.87–5.11)
RDW: 14.7 % (ref 11.5–15.5)
WBC: 10.2 10*3/uL (ref 4.0–10.5)

## 2015-07-16 LAB — GLUCOSE, CAPILLARY
GLUCOSE-CAPILLARY: 120 mg/dL — AB (ref 65–99)
GLUCOSE-CAPILLARY: 157 mg/dL — AB (ref 65–99)

## 2015-07-16 MED ORDER — TRAMADOL HCL 50 MG PO TABS: 100.0000 mg | ORAL_TABLET | Freq: Two times a day (BID) | ORAL | Status: DC | PRN

## 2015-07-16 MED ORDER — LORAZEPAM 1 MG PO TABS
1.0000 mg | ORAL_TABLET | Freq: Three times a day (TID) | ORAL | Status: DC | PRN
  Administered 2015-07-16: 1 mg via ORAL
  Filled 2015-07-16: qty 1

## 2015-07-16 MED ORDER — DIVALPROEX SODIUM 250 MG PO DR TAB: 250.0000 mg | DELAYED_RELEASE_TABLET | Freq: Three times a day (TID) | ORAL | Status: DC

## 2015-07-16 MED ORDER — TRAMADOL HCL 50 MG PO TABS
50.0000 mg | ORAL_TABLET | Freq: Four times a day (QID) | ORAL | Status: DC | PRN
  Administered 2015-07-16 (×2): 50 mg via ORAL
  Filled 2015-07-16 (×2): qty 1

## 2015-07-16 MED ORDER — QUETIAPINE FUMARATE 100 MG PO TABS
400.0000 mg | ORAL_TABLET | Freq: Every day | ORAL | Status: DC
Start: 2015-07-16 — End: 2015-07-16

## 2015-07-16 MED ORDER — LORAZEPAM 0.5 MG PO TABS: 0.5000 mg | ORAL_TABLET | Freq: Three times a day (TID) | ORAL | Status: DC | PRN

## 2015-07-16 MED ORDER — ACETAMINOPHEN 325 MG PO TABS: 650.0000 mg | ORAL_TABLET | Freq: Four times a day (QID) | ORAL | Status: DC | PRN

## 2015-07-16 MED ORDER — DIVALPROEX SODIUM 250 MG PO DR TAB
250.0000 mg | DELAYED_RELEASE_TABLET | Freq: Three times a day (TID) | ORAL | Status: DC
  Filled 2015-07-16: qty 1

## 2015-07-16 MED ORDER — QUETIAPINE FUMARATE 400 MG PO TABS: 400.0000 mg | ORAL_TABLET | Freq: Every day | ORAL | Status: DC

## 2015-07-16 MED ORDER — LORAZEPAM 2 MG/ML IJ SOLN: 1.0000 mg | Freq: Once | INTRAMUSCULAR | Status: DC

## 2015-07-16 MED ORDER — LORAZEPAM 0.5 MG PO TABS
0.5000 mg | ORAL_TABLET | ORAL | Status: AC
  Administered 2015-07-16: 0.5 mg via ORAL
  Filled 2015-07-16: qty 1

## 2015-07-16 NOTE — Progress Notes (Signed)
Report called to [place] at [facility].
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
## 2015-07-16 NOTE — Evaluation (Signed)
Physical Therapy Evaluation Patient Details Name: Melanie Foster MRN: [id number] DOB: 1955-03-02 Today's Date: 07/16/2015   History of Present Illness  60 y.o. female with bipolar disorder, diabetes mellitus, hypertension, hypothyroidism was brought to the ER after patient called EMS with suicidal thoughts and was found to have taken overdose of Xanax  Clinical Impression  Pt admitted with above diagnosis. Pt currently with functional limitations due to the deficits listed below (see PT Problem List).  Pt will benefit from skilled PT to increase their independence and safety with mobility to allow discharge to the venue listed below.  Pt only able to tolerate short distance ambulation today due to chronic L hip pain. Pt uses RW or cane at baseline and will need access to RW upon d/c.  Plan is for pt to d/c to psych facility at this time.     Follow Up Recommendations No PT follow up    Equipment Recommendations   (will need access to RW upon d/c (has one at home))    Recommendations for Other Services       Precautions / Restrictions Precautions Precautions: Fall      Mobility  Bed Mobility Overal bed mobility: Needs Assistance Bed Mobility: Supine to Sit;Sit to Supine     Supine to sit: Supervision;HOB elevated Sit to supine: Min assist;HOB elevated   General bed mobility comments: slight assist provided for LEs onto bed per pt request  Transfers Overall transfer level: Needs assistance Equipment used: Rolling walker (2 wheeled) Transfers: Sit to/from Stand Sit to Stand: Min guard            Ambulation/Gait Ambulation/Gait assistance: Min guard Ambulation Distance (Feet): 12 Feet Assistive device: Rolling walker (2 wheeled) Gait Pattern/deviations: Step-to pattern;Antalgic     General Gait Details: antalgic gait due to c/o L hip pain with step to pattern, pt self limited distance due to pain  Stairs            Wheelchair Mobility    Modified Rankin (Stroke  Patients Only)       Balance Overall balance assessment:  (appears steady with RW)                                           Pertinent Vitals/Pain Pain Assessment: Faces Faces Pain Scale: Hurts even more Pain Location: reports chronic back pain and L hip Pain Descriptors / Indicators: Aching;Sore Pain Intervention(s): Monitored during session;Premedicated before session;Limited activity within patient's tolerance    Home Living Family/patient expects to be discharged to:: Unsure                 Additional Comments: per chart, was living with son, recently moved here, [facility]    Prior Function Level of Independence: Independent with assistive device(s)         Comments: RW or cane     Hand Dominance        Extremity/Trunk Assessment               Lower Extremity Assessment: Generalized weakness         Communication   Communication: No difficulties  Cognition Arousal/Alertness: Awake/alert Behavior During Therapy: WFL for tasks assessed/performed Overall Cognitive Status: Within Functional Limits for tasks assessed                      General  Comments      Exercises        Assessment/Plan    PT Assessment Patient needs continued PT services  PT Diagnosis Difficulty walking   PT Problem List Decreased strength;Decreased activity tolerance;Decreased mobility;Pain  PT Treatment Interventions DME instruction;Gait training;Functional mobility training;Therapeutic activities;Patient/family education;Therapeutic exercise   PT Goals (Current goals can be found in the Care Plan section) Acute Rehab PT Goals PT Goal Formulation: With patient Time For Goal Achievement: 07/23/15 Potential to Achieve Goals: Good    Frequency Min 3X/week   Barriers to discharge        Co-evaluation               End of Session   Activity Tolerance: Patient limited by pain Patient  left: in bed;with call bell/phone within reach;with nursing/sitter in room Nurse Communication: Mobility status    Functional Assessment Tool Used: clinical judgement Functional Limitation: Mobility: Walking and moving around Mobility: Walking and Moving Around Current Status [id number]): At least 1 percent but less than 20 percent impaired, limited or restricted Mobility: Walking and Moving Around Goal Status [phone]): 0 percent impaired, limited or restricted    Time: 1227-1237 PT Time Calculation (min) (ACUTE ONLY): 10 min   Charges:   PT Evaluation $PT Eval Low Complexity: 1 Procedure     PT G Codes:   PT G-Codes **NOT FOR INPATIENT CLASS** Functional Assessment Tool Used: clinical judgement Functional Limitation: Mobility: Walking and moving around Mobility: Walking and Moving Around Current Status [id number]): At least 1 percent but less than 20 percent impaired, limited or restricted Mobility: Walking and Moving Around Goal Status [phone]): 0 percent impaired, limited or restricted    Teneil Shiller,KATHrine E 07/16/2015, 1:17 PM Carmelia Bake, PT, DPT 07/16/2015 Pager: [phone]

## 2015-07-16 NOTE — Progress Notes (Signed)
PT Cancellation Note  Patient Details Name: Melanie Foster MRN: [id number] DOB: 1955/07/10   Cancelled Treatment:    Reason Eval/Treat Not Completed: Patient declined, no reason specified Pt refused stating she needed her meds first, tylenol.  Pt also stated not to let her RN bring tylenol ("not seeing eye to eye").  Pt also with questions about door/curtain policies.  Chasity, Surveyor, quantity, notified of pt concerns and in to room to see pt upon leaving.   Tyrah Broers,KATHrine E 07/16/2015, 11:12 AM Carmelia Bake, PT, DPT 07/16/2015 Pager: [phone]

## 2015-07-16 NOTE — Progress Notes (Signed)
Date: July 16 2015/Tomia Enlow [name], RN, BSN, CCM   [phone]/rolling walker ordered for patient and [facility] rep notified of need to obtain asap.
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
## 2015-07-16 NOTE — Progress Notes (Addendum)
Update: Patient has been accepted to [facility].  Patient is voluntary at this time.  [facility] provided patient with clothing. Patient did not want [facility] to contact family members. [facility] called for transport.  Kathrin Greathouse, Latanya Presser, MSW Clinical Social Worker 5E and Psychiatric Service Line [phone] 07/16/2015  3:19 PM
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
## 2015-09-12 ENCOUNTER — Encounter (HOSPITAL_COMMUNITY): Payer: Self-pay | Admitting: *Deleted

## 2015-09-12 ENCOUNTER — Encounter (HOSPITAL_COMMUNITY): Payer: Self-pay | Admitting: Emergency Medicine

## 2015-09-12 ENCOUNTER — Emergency Department (HOSPITAL_COMMUNITY)
Admission: EM | Admit: 2015-09-12 | Discharge: 2015-09-12 | Disposition: A | Discharge status: Home / Self Care | Payer: Medicare Other | Attending: Dermatology | Admitting: Dermatology

## 2015-09-12 DIAGNOSIS — E039 Hypothyroidism, unspecified: Secondary | ICD-10-CM | POA: Insufficient documentation

## 2015-09-12 DIAGNOSIS — E1140 Type 2 diabetes mellitus with diabetic neuropathy, unspecified: Secondary | ICD-10-CM | POA: Diagnosis not present

## 2015-09-12 DIAGNOSIS — Z5321 Procedure and treatment not carried out due to patient leaving prior to being seen by health care provider: Secondary | ICD-10-CM | POA: Insufficient documentation

## 2015-09-12 DIAGNOSIS — F17210 Nicotine dependence, cigarettes, uncomplicated: Secondary | ICD-10-CM | POA: Insufficient documentation

## 2015-09-12 DIAGNOSIS — Z79899 Other long term (current) drug therapy: Secondary | ICD-10-CM | POA: Insufficient documentation

## 2015-09-12 DIAGNOSIS — Z7984 Long term (current) use of oral hypoglycemic drugs: Secondary | ICD-10-CM | POA: Insufficient documentation

## 2015-09-12 DIAGNOSIS — K5901 Slow transit constipation: Secondary | ICD-10-CM | POA: Insufficient documentation

## 2015-09-12 DIAGNOSIS — K5900 Constipation, unspecified: Secondary | ICD-10-CM | POA: Diagnosis not present

## 2015-09-12 DIAGNOSIS — Z7982 Long term (current) use of aspirin: Secondary | ICD-10-CM | POA: Insufficient documentation

## 2015-09-12 DIAGNOSIS — E119 Type 2 diabetes mellitus without complications: Secondary | ICD-10-CM | POA: Insufficient documentation

## 2015-09-12 DIAGNOSIS — I10 Essential (primary) hypertension: Secondary | ICD-10-CM | POA: Insufficient documentation

## 2015-09-12 LAB — CBC
HEMATOCRIT: 38.8 % (ref 36.0–46.0)
Hemoglobin: 12.1 g/dL (ref 12.0–15.0)
MCH: 27.9 pg (ref 26.0–34.0)
MCHC: 31.2 g/dL (ref 30.0–36.0)
MCV: 89.4 fL (ref 78.0–100.0)
Platelets: 402 10*3/uL — ABNORMAL HIGH (ref 150–400)
RBC: 4.34 MIL/uL (ref 3.87–5.11)
RDW: 16 % — AB (ref 11.5–15.5)
WBC: 14.4 10*3/uL — AB (ref 4.0–10.5)

## 2015-09-12 LAB — COMPREHENSIVE METABOLIC PANEL
ALK PHOS: 70 U/L (ref 38–126)
ALT: 17 U/L (ref 14–54)
ANION GAP: 7 (ref 5–15)
AST: 21 U/L (ref 15–41)
Albumin: 3.9 g/dL (ref 3.5–5.0)
BILIRUBIN TOTAL: 0.6 mg/dL (ref 0.3–1.2)
BUN: 8 mg/dL (ref 6–20)
CALCIUM: 9.5 mg/dL (ref 8.9–10.3)
CO2: 25 mmol/L (ref 22–32)
Chloride: 106 mmol/L (ref 101–111)
Creatinine, Ser: 0.75 mg/dL (ref 0.44–1.00)
GFR calc Af Amer: >60 mL/min (ref >60)
Glucose, Bld: 145 mg/dL — ABNORMAL HIGH (ref 65–99)
POTASSIUM: 3.5 mmol/L (ref 3.5–5.1)
Sodium: 138 mmol/L (ref 135–145)
TOTAL PROTEIN: 7.3 g/dL (ref 6.5–8.1)

## 2015-09-12 LAB — LIPASE, BLOOD: Lipase: 23 U/L (ref 11–51)

## 2015-09-12 NOTE — ED Notes (Signed)
Pt unable to void at this time. 

## 2015-09-12 NOTE — ED Triage Notes (Addendum)
Pt states she has not had a bowel movement and her rectum is hurting. States she took a stool softener and now her rectum feels like it is tearing. States she started bleeding today.   Pt requested this RN go to bathroom with her. Pt asked for a gloved and attempted to disimpact herself. This RN checked pts bottom, no blood or redness noted.

## 2015-09-12 NOTE — ED Triage Notes (Signed)
The pt is c/o being constipated for 5 days  She is also c/o a headache and pain all over her body  lmp none

## 2015-09-13 ENCOUNTER — Emergency Department (HOSPITAL_COMMUNITY)
Admission: EM | Admit: 2015-09-13 | Discharge: 2015-09-13 | Disposition: A | Discharge status: Home / Self Care | Payer: Medicare Other | Attending: Emergency Medicine | Admitting: Emergency Medicine

## 2015-09-13 ENCOUNTER — Emergency Department (HOSPITAL_COMMUNITY): Payer: Medicare Other

## 2015-09-13 DIAGNOSIS — K5901 Slow transit constipation: Secondary | ICD-10-CM | POA: Diagnosis not present

## 2015-09-13 MED ORDER — METOCLOPRAMIDE HCL 10 MG PO TABS
10.0000 mg | ORAL_TABLET | Freq: Once | ORAL | Status: AC
  Administered 2015-09-13: 10 mg via ORAL
  Filled 2015-09-13: qty 1

## 2015-09-13 MED ORDER — IBUPROFEN 400 MG PO TABS
600.0000 mg | ORAL_TABLET | Freq: Once | ORAL | Status: DC
  Filled 2015-09-13: qty 1

## 2015-09-13 MED ORDER — MAGNESIUM CITRATE PO SOLN
1.0000 | Freq: Once | ORAL | Status: AC
  Administered 2015-09-13: 1 via ORAL
  Filled 2015-09-13: qty 296

## 2015-09-13 MED ORDER — SORBITOL 70 % SOLN
960.0000 mL | TOPICAL_OIL | Freq: Once | ORAL | Status: AC
  Administered 2015-09-13: 960 mL via RECTAL
  Filled 2015-09-13: qty 240

## 2015-09-13 NOTE — ED Notes (Signed)
Pt eloped. Seen leaving department by RN. Pt ambulated without difficulty.

## 2015-09-13 NOTE — ED Notes (Signed)
Pt refused ibuprofen at this time. Dr. Claudine Mouton aware.

## 2015-09-13 NOTE — ED Provider Notes (Signed)
[facility] DEPT Provider Note   CSN: [id number] Arrival date & time: 09/12/15  2243  By signing my name below, I, Reola Mosher, attest that this documentation has been prepared under the direction and in the presence of Everlene Balls, MD. Electronically Signed: Reola Mosher, ED Scribe. 09/13/15. 12:52 AM.  History   Chief Complaint Chief Complaint  Patient presents with  . Constipation   The history is provided by the patient. No language interpreter was used.   HPI Comments: Melanie Foster is a 60 y.o. female with a PMHx of polysubstance abuse, who presents to the Emergency Department complaining of gradual onset, unchanged constipation onset ~5 days ago. Pt reports cramping abdominal pain, nausea, and headache secondary to her constipation. She took a stool softener PO this AM PTA with no relief of her constipation. No hx of similar episodes of constipation. 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 Denies emesis, or any other associated symptoms.   Past Medical History:  Diagnosis Date  . Anxiety   . Diabetes mellitus without complication ([place])   . Hypercholesteremia   . Hypertension   . Hypothyroid   . Nerve damage    both legs   Patient Active Problem List   Diagnosis Date Noted  . Suicidal ideation 07/14/2015  . Hypertension 07/14/2015  . Drug overdose 07/14/2015  . Hypokalemia 07/14/2015  . Cocaine abuse 07/14/2015  . Marijuana abuse 07/14/2015  . Chronic back pain 07/14/2015  . Suicide attempt ([place])   . Herpes simplex 04/28/2015  . MDD (major depressive disorder) ([place]) 04/28/2015  . Tobacco abuse 04/28/2015  . Chronic bronchitis ([place]) 04/28/2015  . 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 Bipolar 1 disorder, mixed, severe ([place]) 04/22/2015  . Hyperlipidemia 04/22/2015  . Essential hypertension 04/22/2015  . Type 2 diabetes mellitus with hyperglycemia ([place]) 04/22/2015  . Metabolic acidosis 47/42/5956  .  Hypothyroidism 04/22/2015  . Bipolar I disorder, most recent episode depressed [facility])    Past Surgical History:  Procedure Laterality Date  . KNEE SURGERY Left unknown   surgical scar noted  . ROTATOR CUFF REPAIR Right unknown   per client, surgery scar noted  . SPLENECTOMY Right unknown   per client   OB History    No data available     Home Medications    Prior to Admission medications   Medication Sig Start Date End Date Taking? Authorizing Provider  acetaminophen (TYLENOL) 325 MG tablet Take 2 tablets (650 mg total) by mouth every 6 (six) hours as needed for mild pain, moderate pain or headache. 07/16/15   [place] Marisa Hua, MD  aspirin EC 81 MG tablet Take 81 mg by mouth daily at 12 noon.    Historical Provider, MD  blood glucose meter kit and supplies KIT Dispense based on patient and insurance preference. Use up to four times daily as directed. (FOR ICD-9 250.00, 250.01). 04/28/15   Maren Reamer, MD  Blood Pressure Monitoring (BLOOD PRESSURE CUFF) MISC 1 application by Does not apply route daily after breakfast. 04/28/15   Maren Reamer, MD  Cyanocobalamin (B-12 PO) Take 1 tablet by mouth daily.    Historical Provider, MD  divalproex (DEPAKOTE) 250 MG DR tablet Take 1 tablet (250 mg total) by mouth every 8 (eight) hours. 07/16/15   Clan[place] Marisa Hua, MD  glucose blood (ONE TOUCH ULTRA TEST) test strip Use as instructed 04/28/15   Maren Reamer, MD  Lancets [facility] ULTRASOFT) lancets  Use as instructed 04/28/15   Maren Reamer, MD  levothyroxine (SYNTHROID, LEVOTHROID) 25 MCG tablet Take 1 tablet (25 mcg total) by mouth daily before breakfast. 04/28/15   Maren Reamer, MD  LORazepam (ATIVAN) 0.5 MG tablet Take 1 tablet (0.5 mg total) by mouth every 8 (eight) hours as needed for anxiety (withdrawal). 07/16/15   [place] Marisa Hua, MD  LYRICA 50 MG capsule TAKE 1 CAPSULE BY MOUTH THREE TIMES DAILY 05/18/15   Maren Reamer, MD  metFORMIN (GLUCOPHAGE) 500 MG tablet Take 1  tablet (500 mg total) by mouth daily with breakfast. 04/28/15   Maren Reamer, MD  nicotine (NICODERM CQ - DOSED IN MG/24 HOURS) 21 mg/24hr patch Place 1 patch (21 mg total) onto the skin daily. Patient not taking: Reported on 04/30/2015 04/25/15   Derrill Center, NP  omeprazole (PRILOSEC) 20 MG capsule Take 1 capsule (20 mg total) by mouth daily. Patient taking differently: Take 20 mg by mouth daily as needed (indigestion).  04/28/15   Maren Reamer, MD  QUEtiapine (SEROQUEL) 400 MG tablet Take 1 tablet (400 mg total) by mouth at bedtime. 07/16/15   Clanford Marisa Hua, MD  traMADol (ULTRAM) 50 MG tablet Take 2 tablets (100 mg total) by mouth 2 (two) times daily as needed for moderate pain. Reported on 07/14/2015 07/16/15   Clan[place] Marisa Hua, MD  verapamil (CALAN) 40 MG tablet Take 1 tablet (40 mg total) by mouth 2 (two) times daily. Please tell her to stop the [facility] when she starts this again. 04/28/15   Maren Reamer, MD   Family History Family History  Problem Relation Age of Onset  . Hypertension Other    Social History Social History  Substance Use Topics  . Smoking status: Current Every Day Smoker    Packs/day: 0.50    Types: Cigarettes  . Smokeless tobacco: Never Used  . Alcohol use No   Allergies   Hydrocodone  Review of Systems Review of Systems A complete 10 system review of systems was obtained and all systems are negative except as noted in the HPI and PMH.   Physical Exam Updated Vital Signs BP 149/84 (BP Location: Right Arm)   Pulse 73   Temp 98.6 F (37 C) (Oral)   Resp 26   Ht 5' 5"  (1.651 m)   Wt 175 lb (79.4 kg)   SpO2 98%   BMI 29.12 kg/m   Physical Exam  Constitutional: She is oriented to person, place, and time. She appears well-developed and well-nourished. No distress.  HENT:  Head: Normocephalic and atraumatic.  Nose: Nose normal.  Mouth/Throat: Oropharynx is clear and moist. No oropharyngeal exudate.  Eyes: Conjunctivae and EOM are normal.  Pupils are equal, round, and reactive to light. No scleral icterus.  Neck: Normal range of motion. Neck supple. No JVD present. No tracheal deviation present. No thyromegaly present.  Cardiovascular: Normal rate, regular rhythm and normal heart sounds.  Exam reveals no gallop and no friction rub.   No murmur heard. Pulmonary/Chest: Effort normal and breath sounds normal. No respiratory distress. She has no wheezes. She exhibits no tenderness.  Abdominal: Soft. Bowel sounds are normal. She exhibits no distension and no mass. There is no tenderness. There is no rebound and no guarding.  Musculoskeletal: Normal range of motion. She exhibits no edema or tenderness.  Lymphadenopathy:    She has no cervical adenopathy.  Neurological: She is alert and oriented to person, place, and time. No cranial nerve deficit.  She exhibits normal muscle tone.  Skin: Skin is warm and dry. No rash noted. No erythema. No pallor.  Nursing note and vitals reviewed.  ED Treatments / Results  DIAGNOSTIC STUDIES: Oxygen Saturation is 98% on RA, normal by my interpretation.   COORDINATION OF CARE: 12:51 AM-Discussed next steps with pt. Pt verbalized understanding and is agreeable with the plan.   Labs (all labs ordered are listed, but only abnormal results are displayed) Labs Reviewed  COMPREHENSIVE METABOLIC PANEL - Abnormal; Notable for the following:       Result Value   Glucose, Bld 145 (*)    All other components within normal limits  CBC - Abnormal; Notable for the following:    WBC 14.4 (*)    RDW 16.0 (*)    Platelets 402 (*)    All other components within normal limits  LIPASE, BLOOD  URINALYSIS, ROUTINE W REFLEX MICROSCOPIC (NOT AT [facility])  I-STAT BETA HCG BLOOD, ED ([facility], [facility], [facility] ONLY)   EKG  EKG Interpretation None      Radiology No results found.  Procedures Procedures (including critical care time)  Medications Ordered in ED Medications - No data to display  Initial Impression /  Assessment and Plan / ED Course  I have reviewed the triage vital signs and the nursing notes.  Pertinent labs & imaging results that were available during my care of the patient were reviewed by me and considered in my medical decision making (see chart for details).  Clinical Course    Patient presents to the ED for constipation.  She states she strains on the toilet.  She is not wanting an enema currently so will start with mag citrate.  Also has history of obstruction but states normally she has vomiting with that.  Today there has been no vomiting.  Will check XR for air fluid levels.     2:38 AM I was told patient had a large BM and then eloped from the ED prior to receiving any education or DC paperwork  Final Clinical Impressions(s) / ED Diagnoses   Final diagnoses:  None    New Prescriptions New Prescriptions   No medications on file      I personally performed the services described in this documentation, which was scribed in my presence. The recorded information has been reviewed and is accurate.       Everlene Balls, MD 09/13/15 [phone]

## 2015-09-13 NOTE — ED Notes (Signed)
Pt had large BM, Dr. Claudine Mouton aware.

## 2015-09-14 ENCOUNTER — Emergency Department (HOSPITAL_COMMUNITY): Payer: Medicare Other

## 2015-09-14 ENCOUNTER — Emergency Department (HOSPITAL_COMMUNITY)
Admission: EM | Admit: 2015-09-14 | Discharge: 2015-09-15 | Payer: Medicare Other | Attending: Emergency Medicine | Admitting: Emergency Medicine

## 2015-09-14 ENCOUNTER — Encounter (HOSPITAL_COMMUNITY): Payer: Self-pay

## 2015-09-14 DIAGNOSIS — Z79899 Other long term (current) drug therapy: Secondary | ICD-10-CM | POA: Diagnosis not present

## 2015-09-14 DIAGNOSIS — Z7982 Long term (current) use of aspirin: Secondary | ICD-10-CM | POA: Diagnosis not present

## 2015-09-14 DIAGNOSIS — F1290 Cannabis use, unspecified, uncomplicated: Secondary | ICD-10-CM | POA: Diagnosis not present

## 2015-09-14 DIAGNOSIS — E119 Type 2 diabetes mellitus without complications: Secondary | ICD-10-CM | POA: Insufficient documentation

## 2015-09-14 DIAGNOSIS — E039 Hypothyroidism, unspecified: Secondary | ICD-10-CM | POA: Diagnosis not present

## 2015-09-14 DIAGNOSIS — F3163 Bipolar disorder, current episode mixed, severe, without psychotic features: Secondary | ICD-10-CM | POA: Insufficient documentation

## 2015-09-14 DIAGNOSIS — R45851 Suicidal ideations: Secondary | ICD-10-CM

## 2015-09-14 DIAGNOSIS — Z7984 Long term (current) use of oral hypoglycemic drugs: Secondary | ICD-10-CM | POA: Insufficient documentation

## 2015-09-14 DIAGNOSIS — F17200 Nicotine dependence, unspecified, uncomplicated: Secondary | ICD-10-CM | POA: Diagnosis not present

## 2015-09-14 DIAGNOSIS — I10 Essential (primary) hypertension: Secondary | ICD-10-CM | POA: Insufficient documentation

## 2015-09-14 DIAGNOSIS — F1920 Other psychoactive substance dependence, uncomplicated: Secondary | ICD-10-CM | POA: Diagnosis present

## 2015-09-14 LAB — CBC
HEMATOCRIT: 36.1 % (ref 36.0–46.0)
HEMOGLOBIN: 12 g/dL (ref 12.0–15.0)
MCH: 28.4 pg (ref 26.0–34.0)
MCHC: 33.2 g/dL (ref 30.0–36.0)
MCV: 85.3 fL (ref 78.0–100.0)
Platelets: 421 10*3/uL — ABNORMAL HIGH (ref 150–400)
RBC: 4.23 MIL/uL (ref 3.87–5.11)
RDW: 15.9 % — ABNORMAL HIGH (ref 11.5–15.5)
WBC: 14.4 10*3/uL — ABNORMAL HIGH (ref 4.0–10.5)

## 2015-09-14 LAB — RAPID URINE DRUG SCREEN, HOSP PERFORMED
Amphetamines: NOT DETECTED
BARBITURATES: NOT DETECTED
BENZODIAZEPINES: POSITIVE — AB
COCAINE: POSITIVE — AB
Opiates: NOT DETECTED
TETRAHYDROCANNABINOL: POSITIVE — AB

## 2015-09-14 LAB — COMPREHENSIVE METABOLIC PANEL
ALK PHOS: 78 U/L (ref 38–126)
ALT: 16 U/L (ref 14–54)
ANION GAP: 10 (ref 5–15)
AST: 23 U/L (ref 15–41)
Albumin: 4.6 g/dL (ref 3.5–5.0)
BILIRUBIN TOTAL: 0.6 mg/dL (ref 0.3–1.2)
BUN: 7 mg/dL (ref 6–20)
CALCIUM: 10.1 mg/dL (ref 8.9–10.3)
CO2: 23 mmol/L (ref 22–32)
Chloride: 109 mmol/L (ref 101–111)
Creatinine, Ser: 0.69 mg/dL (ref 0.44–1.00)
GLUCOSE: 124 mg/dL — AB (ref 65–99)
Potassium: 3.7 mmol/L (ref 3.5–5.1)
Sodium: 142 mmol/L (ref 135–145)
Total Protein: 8.6 g/dL — ABNORMAL HIGH (ref 6.5–8.1)

## 2015-09-14 LAB — SALICYLATE LEVEL: Salicylate Lvl: <4 mg/dL (ref 2.8–30.0)

## 2015-09-14 LAB — ETHANOL: Alcohol, Ethyl (B): <5 mg/dL (ref <5)

## 2015-09-14 LAB — ACETAMINOPHEN LEVEL

## 2015-09-14 LAB — VALPROIC ACID LEVEL

## 2015-09-14 MED ORDER — METFORMIN HCL 500 MG PO TABS
500.0000 mg | ORAL_TABLET | Freq: Every day | ORAL | Status: DC
  Administered 2015-09-15: 500 mg via ORAL
  Filled 2015-09-14: qty 1

## 2015-09-14 MED ORDER — ACETAMINOPHEN 325 MG PO TABS
650.0000 mg | ORAL_TABLET | Freq: Once | ORAL | Status: DC
  Filled 2015-09-14: qty 2

## 2015-09-14 MED ORDER — LORAZEPAM 1 MG PO TABS
1.0000 mg | ORAL_TABLET | Freq: Once | ORAL | Status: AC
  Administered 2015-09-14: 1 mg via ORAL
  Filled 2015-09-14: qty 1

## 2015-09-14 MED ORDER — PREGABALIN 50 MG PO CAPS
50.0000 mg | ORAL_CAPSULE | Freq: Three times a day (TID) | ORAL | Status: DC
  Administered 2015-09-15 (×4): 50 mg via ORAL
  Filled 2015-09-14 (×4): qty 1

## 2015-09-14 MED ORDER — VERAPAMIL HCL 40 MG PO TABS
40.0000 mg | ORAL_TABLET | Freq: Two times a day (BID) | ORAL | Status: DC
  Administered 2015-09-15 (×2): 40 mg via ORAL
  Filled 2015-09-14 (×4): qty 1

## 2015-09-14 MED ORDER — LEVOTHYROXINE SODIUM 25 MCG PO TABS
25.0000 ug | ORAL_TABLET | Freq: Every day | ORAL | Status: DC
  Administered 2015-09-15: 25 ug via ORAL
  Filled 2015-09-14 (×2): qty 1

## 2015-09-14 MED ORDER — DIVALPROEX SODIUM 250 MG PO DR TAB
250.0000 mg | DELAYED_RELEASE_TABLET | Freq: Three times a day (TID) | ORAL | Status: DC
  Administered 2015-09-15 (×4): 250 mg via ORAL
  Filled 2015-09-14 (×4): qty 1

## 2015-09-14 MED ORDER — QUETIAPINE FUMARATE 300 MG PO TABS
400.0000 mg | ORAL_TABLET | Freq: Every day | ORAL | Status: DC
  Administered 2015-09-15 (×2): 400 mg via ORAL
  Filled 2015-09-14 (×2): qty 1

## 2015-09-14 MED ORDER — ASPIRIN EC 81 MG PO TBEC
81.0000 mg | DELAYED_RELEASE_TABLET | Freq: Every day | ORAL | Status: DC
Start: 2015-09-15 — End: 2015-09-15
  Administered 2015-09-15: 81 mg via ORAL
  Filled 2015-09-14: qty 1

## 2015-09-14 MED ORDER — LORAZEPAM 0.5 MG PO TABS
0.5000 mg | ORAL_TABLET | Freq: Three times a day (TID) | ORAL | Status: DC | PRN
  Administered 2015-09-15: 0.5 mg via ORAL
  Filled 2015-09-14: qty 1

## 2015-09-14 NOTE — ED Notes (Signed)
Bed: [facility] Expected date:  Expected time:  Means of arrival:  Comments: 

## 2015-09-14 NOTE — ED Triage Notes (Signed)
Pt presents with c/o suicidal ideation and drug use. Pt reports that she has been seeing things and that she also feels stuff crawling on her. When asked pt if she is feeling suicidal, she reports that if "nobody is going to help her, she is going to walk into traffic". Pt is very anxious, talking very rapidly. Pt was recently seen and discharged from [facility]. Pt also reporting drug use. Pt reports her last use of crack was a couple of hours ago.

## 2015-09-14 NOTE — ED Provider Notes (Signed)
[facility] DEPT Provider Note   CSN: [id number] Arrival date & time: 09/14/15  2020     History   Chief Complaint Chief Complaint  Patient presents with  . Hallucinations  . Suicidal    HPI Melanie Foster is a 60 y.o. female.  The history is provided by the patient and a relative. No language interpreter was used.    Melanie Foster is a 60 y.o. female who presents to the Emergency Department complaining of SI.  She presents with her niece for evaluation of suicidal ideations. She has a history of crack cocaine abuse, last used 4 hours prior to ED arrival. The niece is concerned that the patient needs detox over psychiatric care. The patient endorses suicidal ideation with plan to jump in front of a car. She states that she is seeing shadows and she is feeling things crawling over her body. She has been noncompliant with her medications. Symptoms are severe, constant, worsening. She was recently in the hospital for pneumonia and still has a persistent cough. No fevers, chest pain, vomiting.  Past Medical History:  Diagnosis Date  . Anxiety   . Diabetes mellitus without complication ([place])   . Hypercholesteremia   . Hypertension   . Hypothyroid   . Nerve damage    both legs    Patient Active Problem List   Diagnosis Date Noted  . Suicidal ideation 07/14/2015  . Hypertension 07/14/2015  . Drug overdose 07/14/2015  . Hypokalemia 07/14/2015  . Cocaine abuse 07/14/2015  . Marijuana abuse 07/14/2015  . Chronic back pain 07/14/2015  . Suicide attempt ([place])   . Herpes simplex 04/28/2015  . MDD (major depressive disorder) ([place]) 04/28/2015  . Tobacco abuse 04/28/2015  . Chronic bronchitis ([place]) 04/28/2015  . DM neuropathy, type II diabetes mellitus ([place]) 04/28/2015  . Bipolar 1 disorder, mixed, severe ([place]) 04/22/2015  . Hyperlipidemia 04/22/2015  . Essential hypertension 04/22/2015  . Type 2 diabetes mellitus with hyperglycemia ([place]) 04/22/2015  . Metabolic acidosis 46/27/0350  .  Hypothyroidism 04/22/2015  . Bipolar I disorder, most recent episode depressed [facility])     Past Surgical History:  Procedure Laterality Date  . KNEE SURGERY Left unknown   surgical scar noted  . ROTATOR CUFF REPAIR Right unknown   per client, surgery scar noted  . SPLENECTOMY Right unknown   per client    OB History    No data available       Home Medications    Prior to Admission medications   Medication Sig Start Date End Date Taking? Authorizing Provider  acetaminophen (TYLENOL) 325 MG tablet Take 2 tablets (650 mg total) by mouth every 6 (six) hours as needed for mild pain, moderate pain or headache. 07/16/15  Yes Clanford Marisa Hua, MD  aspirin EC 81 MG tablet Take 81 mg by mouth daily at 12 noon.   Yes Historical Provider, MD  Cyanocobalamin (B-12 PO) Take 1 tablet by mouth daily.   Yes Historical Provider, MD  divalproex (DEPAKOTE) 250 MG DR tablet Take 1 tablet (250 mg total) by mouth every 8 (eight) hours. 07/16/15  Yes Clanford Marisa Hua, MD  levothyroxine (SYNTHROID, LEVOTHROID) 25 MCG tablet Take 1 tablet (25 mcg total) by mouth daily before breakfast. 04/28/15  Yes Maren Reamer, MD  LORazepam (ATIVAN) 0.5 MG tablet Take 1 tablet (0.5 mg total) by mouth every 8 (eight) hours as needed for anxiety (withdrawal). 07/16/15  Yes Clanford L Johnson, MD  LYRICA 50 MG capsule TAKE 1 CAPSULE BY MOUTH  THREE TIMES DAILY 05/18/15  Yes Maren Reamer, MD  metFORMIN (GLUCOPHAGE) 500 MG tablet Take 1 tablet (500 mg total) by mouth daily with breakfast. 04/28/15  Yes Maren Reamer, MD  omeprazole (PRILOSEC) 20 MG capsule Take 1 capsule (20 mg total) by mouth daily. Patient taking differently: Take 20 mg by mouth daily as needed (indigestion).  04/28/15  Yes Maren Reamer, MD  QUEtiapine (SEROQUEL) 400 MG tablet Take 1 tablet (400 mg total) by mouth at bedtime. 07/16/15  Yes Clanford Marisa Hua, MD  verapamil (CALAN) 40 MG tablet Take 1 tablet (40 mg total) by mouth 2 (two) times daily.  Please tell her to stop the [facility] when she starts this again. 04/28/15  Yes Maren Reamer, MD  blood glucose meter kit and supplies KIT Dispense based on patient and insurance preference. Use up to four times daily as directed. (FOR ICD-9 250.00, 250.01). 04/28/15   Maren Reamer, MD  Blood Pressure Monitoring (BLOOD PRESSURE CUFF) MISC 1 application by Does not apply route daily after breakfast. 04/28/15   Maren Reamer, MD  glucose blood (ONE TOUCH ULTRA TEST) test strip Use as instructed 04/28/15   Maren Reamer, MD  Lancets (ONETOUCH ULTRASOFT) lancets Use as instructed 04/28/15   Maren Reamer, MD  nicotine (NICODERM CQ - DOSED IN MG/24 HOURS) 21 mg/24hr patch Place 1 patch (21 mg total) onto the skin daily. Patient not taking: Reported on 04/30/2015 04/25/15   Derrill Center, NP  traMADol (ULTRAM) 50 MG tablet Take 2 tablets (100 mg total) by mouth 2 (two) times daily as needed for moderate pain. Reported on 07/14/2015 Patient not taking: Reported on 09/14/2015 07/16/15   Clanford Marisa Hua, MD    Family History Family History  Problem Relation Age of Onset  . Hypertension Other     Social History Social History  Substance Use Topics  . Smoking status: Current Every Day Smoker    Packs/day: 0.50    Types: Cigarettes  . Smokeless tobacco: Never Used  . Alcohol use No     Allergies   Hydrocodone   Review of Systems Review of Systems  All other systems reviewed and are negative.    Physical Exam Updated Vital Signs BP 128/86   Pulse 69   Temp 98.5 F (36.9 C)   Resp 18   Ht _0  (1.651 m)   Wt 175 lb (79.4 kg)   SpO2 100%   BMI 29.12 kg/m   Physical Exam  Constitutional: She is oriented to person, place, and time. She appears well-developed and well-nourished.  HENT:  Head: Normocephalic and atraumatic.  Cardiovascular: Normal rate and regular rhythm.   No murmur heard. Pulmonary/Chest: Effort normal and breath sounds normal. No respiratory distress.    Abdominal: Soft. There is no tenderness. There is no rebound and no guarding.  Musculoskeletal: She exhibits no edema or tenderness.  Neurological: She is alert and oriented to person, place, and time.  Skin: Skin is warm and dry.  Psychiatric:  Paranoid and anxious. Appears to be responding to stimuli that are not present.  Nursing note and vitals reviewed.    ED Treatments / Results  Labs (all labs ordered are listed, but only abnormal results are displayed) Labs Reviewed  COMPREHENSIVE METABOLIC PANEL - Abnormal; Notable for the following:       Result Value   Glucose, Bld 124 (*)    Total Protein 8.6 (*)    All other components within  normal limits  ACETAMINOPHEN LEVEL - Abnormal; Notable for the following:    Acetaminophen (Tylenol), Serum <10 (*)    All other components within normal limits  CBC - Abnormal; Notable for the following:    WBC 14.4 (*)    RDW 15.9 (*)    Platelets 421 (*)    All other components within normal limits  URINE RAPID DRUG SCREEN, HOSP PERFORMED - Abnormal; Notable for the following:    Cocaine POSITIVE (*)    Benzodiazepines POSITIVE (*)    Tetrahydrocannabinol POSITIVE (*)    All other components within normal limits  VALPROIC ACID LEVEL - Abnormal; Notable for the following:    Valproic Acid Lvl <10 (*)    All other components within normal limits  ETHANOL  SALICYLATE LEVEL  URINALYSIS, ROUTINE W REFLEX MICROSCOPIC (NOT AT [facility])    EKG  EKG Interpretation  Date/Time:  Tuesday September 14 2015 23:21:34 EDT Ventricular Rate:  74 PR Interval:    QRS Duration: 92 QT Interval:  423 QTC Calculation: 470 R Axis:   29 Text Interpretation:  Sinus rhythm Ventricular premature complex Short PR interval Borderline T abnormalities, anterior leads Confirmed by Hazle Coca [id number]) on 09/14/2015 11:52:38 PM       Radiology Dg Chest 2 View  Result Date: 09/14/2015 CLINICAL DATA:  Altered mental status, cough. EXAM: CHEST  2 VIEW COMPARISON:   Chest radiograph July 15, 2015 FINDINGS: Cardiomediastinal silhouette is normal. No pleural effusions or focal consolidations. Trachea projects midline and there is no pneumothorax. Soft tissue planes and included osseous structures are non-suspicious. Surgical clips project in the LEFT abdomen. Chronic deformity LEFT humeral head with loose bodies. IMPRESSION: No acute cardiopulmonary process. Electronically Signed   By: Elon Alas M.D.   On: 09/14/2015 22:46   Dg Abd Portable 2 Views  Result Date: 09/13/2015 CLINICAL DATA:  Acute onset of generalized abdominal pain and constipation. Initial encounter. EXAM: PORTABLE ABDOMEN - 2 VIEW COMPARISON:  None. FINDINGS: The visualized bowel gas pattern is unremarkable. Scattered air and stool filled loops of colon are seen; no abnormal dilatation of small bowel loops is seen to suggest small bowel obstruction. No free intra-abdominal air is identified on the provided upright view. Clips are seen overlying the left upper quadrant. The visualized osseous structures are within normal limits; the sacroiliac joints are unremarkable in appearance. The visualized lung bases are essentially clear. IMPRESSION: Unremarkable bowel gas pattern; no free intra-abdominal air seen. Large amount of stool noted in the colon, raising concern for constipation. Electronically Signed   By: Garald Balding M.D.   On: 09/13/2015 01:31    Procedures Procedures (including critical care time)  Medications Ordered in ED Medications  aspirin EC tablet 81 mg (not administered)  divalproex (DEPAKOTE) DR tablet 250 mg (not administered)  levothyroxine (SYNTHROID, LEVOTHROID) tablet 25 mcg (not administered)  LORazepam (ATIVAN) tablet 0.5 mg (not administered)  pregabalin (LYRICA) capsule 50 mg (not administered)  metFORMIN (GLUCOPHAGE) tablet 500 mg (not administered)  QUEtiapine (SEROQUEL) tablet 400 mg (not administered)  verapamil (CALAN) tablet 40 mg (not administered)    acetaminophen (TYLENOL) tablet 650 mg (650 mg Oral Not Given 09/14/15 2336)  LORazepam (ATIVAN) tablet 1 mg (1 mg Oral Given 09/14/15 2316)     Initial Impression / Assessment and Plan / ED Course  I have reviewed the triage vital signs and the nursing notes.  Pertinent labs & imaging results that were available during my care of the patient were reviewed by me  and considered in my medical decision making (see chart for details).  Clinical Course    Patient with a history of substance abuse and suicidal ideation here with recurrent symptoms.  Pt has been medically cleared for psychiatric treatment  Final Clinical Impressions(s) / ED Diagnoses   Final diagnoses:  None    New Prescriptions New Prescriptions   No medications on file     Quintella Reichert, MD 09/15/15 [phone]

## 2015-09-14 NOTE — ED Notes (Signed)
Blood draw attempt unsuccessful. RN notified. 

## 2015-09-14 NOTE — BH Assessment (Signed)
Consult received. There is no note indicating that patient has been medically cleared. Attempted to reach EDP without success. Informed patients nurse that patient will need to be medically cleared to be seen by TTS.   Rosalin Hawking, LCSW Therapeutic Triage Specialist [place] 09/14/2015 11:05 PM

## 2015-09-15 ENCOUNTER — Inpatient Hospital Stay (HOSPITAL_COMMUNITY)
Admission: AD | Admit: 2015-09-15 | Discharge: 2015-09-23 | DRG: 885 | Disposition: A | Discharge status: Home / Self Care | Payer: Medicare Other | Source: Intra-hospital | Attending: Psychiatry | Admitting: Psychiatry

## 2015-09-15 DIAGNOSIS — Z808 Family history of malignant neoplasm of other organs or systems: Secondary | ICD-10-CM | POA: Diagnosis not present

## 2015-09-15 DIAGNOSIS — Z8249 Family history of ischemic heart disease and other diseases of the circulatory system: Secondary | ICD-10-CM

## 2015-09-15 DIAGNOSIS — Z811 Family history of alcohol abuse and dependence: Secondary | ICD-10-CM | POA: Diagnosis not present

## 2015-09-15 DIAGNOSIS — Z818 Family history of other mental and behavioral disorders: Secondary | ICD-10-CM | POA: Diagnosis not present

## 2015-09-15 DIAGNOSIS — K219 Gastro-esophageal reflux disease without esophagitis: Secondary | ICD-10-CM | POA: Diagnosis present

## 2015-09-15 DIAGNOSIS — F17210 Nicotine dependence, cigarettes, uncomplicated: Secondary | ICD-10-CM | POA: Diagnosis present

## 2015-09-15 DIAGNOSIS — G4700 Insomnia, unspecified: Secondary | ICD-10-CM | POA: Diagnosis present

## 2015-09-15 DIAGNOSIS — E039 Hypothyroidism, unspecified: Secondary | ICD-10-CM | POA: Diagnosis present

## 2015-09-15 DIAGNOSIS — Z79899 Other long term (current) drug therapy: Secondary | ICD-10-CM | POA: Diagnosis not present

## 2015-09-15 DIAGNOSIS — F1420 Cocaine dependence, uncomplicated: Secondary | ICD-10-CM | POA: Diagnosis present

## 2015-09-15 DIAGNOSIS — F419 Anxiety disorder, unspecified: Secondary | ICD-10-CM | POA: Diagnosis present

## 2015-09-15 DIAGNOSIS — F3163 Bipolar disorder, current episode mixed, severe, without psychotic features: Secondary | ICD-10-CM | POA: Diagnosis present

## 2015-09-15 DIAGNOSIS — F1210 Cannabis abuse, uncomplicated: Secondary | ICD-10-CM | POA: Diagnosis present

## 2015-09-15 DIAGNOSIS — Z7984 Long term (current) use of oral hypoglycemic drugs: Secondary | ICD-10-CM | POA: Diagnosis not present

## 2015-09-15 DIAGNOSIS — F3160 Bipolar disorder, current episode mixed, unspecified: Secondary | ICD-10-CM | POA: Diagnosis present

## 2015-09-15 DIAGNOSIS — Z7982 Long term (current) use of aspirin: Secondary | ICD-10-CM | POA: Diagnosis not present

## 2015-09-15 DIAGNOSIS — E7800 Pure hypercholesterolemia, unspecified: Secondary | ICD-10-CM | POA: Diagnosis present

## 2015-09-15 DIAGNOSIS — E785 Hyperlipidemia, unspecified: Secondary | ICD-10-CM | POA: Diagnosis present

## 2015-09-15 DIAGNOSIS — E1140 Type 2 diabetes mellitus with diabetic neuropathy, unspecified: Secondary | ICD-10-CM | POA: Diagnosis present

## 2015-09-15 DIAGNOSIS — I10 Essential (primary) hypertension: Secondary | ICD-10-CM | POA: Diagnosis present

## 2015-09-15 DIAGNOSIS — Z23 Encounter for immunization: Secondary | ICD-10-CM

## 2015-09-15 DIAGNOSIS — E1165 Type 2 diabetes mellitus with hyperglycemia: Secondary | ICD-10-CM | POA: Diagnosis present

## 2015-09-15 DIAGNOSIS — F3130 Bipolar disorder, current episode depressed, mild or moderate severity, unspecified: Secondary | ICD-10-CM | POA: Diagnosis present

## 2015-09-15 DIAGNOSIS — R45851 Suicidal ideations: Secondary | ICD-10-CM | POA: Diagnosis not present

## 2015-09-15 DIAGNOSIS — F1920 Other psychoactive substance dependence, uncomplicated: Secondary | ICD-10-CM | POA: Diagnosis present

## 2015-09-15 LAB — URINALYSIS, ROUTINE W REFLEX MICROSCOPIC
Glucose, UA: 100 mg/dL — AB
Hgb urine dipstick: NEGATIVE
Ketones, ur: NEGATIVE mg/dL
Leukocytes, UA: NEGATIVE
NITRITE: NEGATIVE
PROTEIN: 30 mg/dL — AB
SPECIFIC GRAVITY, URINE: 1.029 (ref 1.005–1.030)
pH: 6.5 (ref 5.0–8.0)

## 2015-09-15 LAB — URINE MICROSCOPIC-ADD ON

## 2015-09-15 NOTE — ED Notes (Signed)
Patient alert and interacts with staff. Patient states "she relapsed on drugs, has a son whom which she has problems, and that she has been having thoughts of hurting herself." Patient wanted to rest. Support and encouragement offered. Patient allowed to rest. Q 15 minute checks in progress and maintained for safety. Patient remains safe on unit.
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
## 2015-09-15 NOTE — BH Assessment (Signed)
Assessment completed. Consulted with Patriciaann Clan, PA-C who recommends inpatient treatment and recommends a 300 hall bed. Spoke with Inocencio Homes, RN, AC.   Rosalin Hawking, LCSW Therapeutic Triage Specialist [place] 09/15/2015 12:14 AM

## 2015-09-15 NOTE — Progress Notes (Signed)
D: Pt was in bed in her room upon initial approach.  Pt has depressed affect and mood.  Pt reports SI, she refuses to elaborate.  She verbally contracts for safety at this time.  Pt denies HI.  She endorses visual hallucinations, "but they're not bad when I have my eyes closed."  Pt complains of back pain.  PRN medication offered, pt declined.     A: Introduced self to pt.  Met with pt and offered support and encouragement.  Actively listened to pt.  POC discussed with pt.   R: Pt verbally contracts for safety.  Will continue to monitor and assess.

## 2015-09-15 NOTE — Consult Note (Signed)
[facility] Consult   Reason for Consult:  Psychiatric Evaluation Referring Physician:  EDP Patient Identification: Melanie Foster MRN:  [id number] Principal Diagnosis: Bipolar 1 disorder, mixed, severe ([place]) Diagnosis:   Patient Active Problem List   Diagnosis Date Noted  . Polysubstance dependence ([place]) [F19.20] 09/15/2015  . Suicidal ideation [R45.851] 07/14/2015  . Hypertension [I10] 07/14/2015  . Drug overdose [T50.901A] 07/14/2015  . Hypokalemia [E87.6] 07/14/2015  . Cocaine abuse [F14.10] 07/14/2015  . Marijuana abuse [F12.10] 07/14/2015  . Chronic back pain [M54.9, G89.29] 07/14/2015  . Suicide attempt ([place]) [T14.91]   . Herpes simplex [B00.9] 04/28/2015  . MDD (major depressive disorder) ([place]) [F32.9] 04/28/2015  . Tobacco abuse [Z72.0] 04/28/2015  . Chronic bronchitis ([place]) [J42] 04/28/2015  . 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 Bipolar 1 disorder, mixed, severe ([place]) [F31.63] 04/22/2015  . Hyperlipidemia [E78.5] 04/22/2015  . Essential hypertension [I10] 04/22/2015  . Type 2 diabetes mellitus with hyperglycemia ([place]) [E11.65] 04/22/2015  . Metabolic acidosis [C94.7] 04/22/2015  . Hypothyroidism [E03.9] 04/22/2015  . Bipolar I disorder, most recent episode depressed ([place]) [F31.30]     Total Time spent with patient: 45 minutes  Subjective:   Melanie Foster is a 60 y.o. female patient who states "I don't want to live the way I have to live."  HPI:  Melanie Foster is a 60 yo [place] American female who presented to [facility] ED for evaluation of suicidal ideation and substance abuse. She is seen face-to-face today with Dr. Darleene Cleaver. She states she moved to [place] 5 months ago from [place] to help her son who is an alcoholic. She states her son has been "mean" to her and raises his voice at her. She states she was clean from substances for 5 years and relapsed on crack last September. She reports she last used crack yesterday. Her UDS  is positive for cocaine, benzodiazepines and THC. She states she hadn't eaten in 2 days. She used crack once or twice a month. She endorses feeling depressed, sad, and humiliated. She denies suicidal or homicidal ideation, intent or plan. She denies visual hallucinations. She states she sees "shadows all the time" but it is worse when using crack.   Past Psychiatric History: Bipolar disorder, polysubstance abuse  Risk to Self: Suicidal Ideation:  ("I can't say, I don't want to kill myself but I can't live l) Suicidal Intent: No Is patient at risk for suicide?: Yes Suicidal Plan?:  ("no but it seems easy to walk in traffic") Access to Means: Yes Specify Access to Suicidal Means: traffic What has been your use of drugs/alcohol within the last 12 months?: Crack daily How many times?: 2 (last time two weeks ago) Other Self Harm Risks: Denies Triggers for Past Attempts: Other (Comment) (relapse) Intentional Self Injurious Behavior: None Risk to Others: Homicidal Ideation: No Thoughts of Harm to Others: No Current Homicidal Intent: No Current Homicidal Plan: No Access to Homicidal Means: No Identified Victim: Denies History of harm to others?: No Assessment of Violence: None Noted Violent Behavior Description: Denies Does patient have access to weapons?: No Criminal Charges Pending?: No Does patient have a court date: No Prior Inpatient Therapy: Prior Inpatient Therapy: Yes Prior Therapy Dates: 2017 Prior Therapy Facilty[facility]Provider([facility]): [facility] Reason for Treatment: OD Prior Outpatient Therapy: Prior Outpatient Therapy: Yes Prior Therapy Dates: Present Prior Therapy Facilty/Provider(s): [facility] Reason for Treatment: Depression and anxiety Does patient have an ACCT team?: No Does patient have Intensive In-House Services?  :  No Does patient have [facility] services? : No Does patient have P4CC services?: No  Past Medical History:  Past Medical History:  Diagnosis Date   . Anxiety   . Diabetes mellitus without complication ([place])   . Hypercholesteremia   . Hypertension   . Hypothyroid   . Nerve damage    both legs    Past Surgical History:  Procedure Laterality Date  . KNEE SURGERY Left unknown   surgical scar noted  . ROTATOR CUFF REPAIR Right unknown   per client, surgery scar noted  . SPLENECTOMY Right unknown   per client   Family History:  Family History  Problem Relation Age of Onset  . Hypertension Other    Family Psychiatric  History: unknown Social History:  History  Alcohol Use No     History  Drug Use No    Social History   Social History  . Marital status: Single    Spouse name: N/A  . Number of children: N/A  . Years of education: N/A   Social History Main Topics  . Smoking status: Current Every Day Smoker    Packs/day: 0.50    Types: Cigarettes  . Smokeless tobacco: Never Used  . Alcohol use No  . Drug use: No  . Sexual activity: Not Currently   Other Topics Concern  . None   Social History Narrative  . None   Additional Social History:    Allergies:   Allergies  Allergen Reactions  . Hydrocodone Nausea Only    Oxycodone is fine    Labs:  Results for orders placed or performed during the hospital encounter of 09/14/15 (from the past 48 hour(s))  Rapid urine drug screen (hospital performed)     Status: Abnormal   Collection Time: 09/14/15  9:02 PM  Result Value Ref Range   Opiates NONE DETECTED NONE DETECTED   Cocaine POSITIVE (A) NONE DETECTED   Benzodiazepines POSITIVE (A) NONE DETECTED   Amphetamines NONE DETECTED NONE DETECTED   Tetrahydrocannabinol POSITIVE (A) NONE DETECTED   Barbiturates NONE DETECTED NONE DETECTED    Comment:        DRUG SCREEN FOR MEDICAL PURPOSES ONLY.  IF CONFIRMATION IS NEEDED FOR ANY PURPOSE, NOTIFY LAB WITHIN 5 DAYS.        LOWEST DETECTABLE LIMITS FOR URINE DRUG SCREEN Drug Class       Cutoff (ng/mL) Amphetamine      1000 Barbiturate       200 Benzodiazepine   878 Tricyclics       676 Opiates          300 Cocaine          300 THC              50   Urinalysis, Routine w reflex microscopic     Status: Abnormal   Collection Time: 09/14/15  9:02 PM  Result Value Ref Range   Color, Urine AMBER (A) YELLOW    Comment: BIOCHEMICALS MAY BE AFFECTED BY COLOR   APPearance TURBID (A) CLEAR   Specific Gravity, Urine 1.029 1.005 - 1.030   pH 6.5 5.0 - 8.0   Glucose, UA 100 (A) NEGATIVE mg/dL   Hgb urine dipstick NEGATIVE NEGATIVE   Bilirubin Urine SMALL (A) NEGATIVE   Ketones, ur NEGATIVE NEGATIVE mg/dL   Protein, ur 30 (A) NEGATIVE mg/dL   Nitrite NEGATIVE NEGATIVE   Leukocytes, UA NEGATIVE NEGATIVE  Urine microscopic-add on     Status: Abnormal   Collection Time:  09/14/15  9:02 PM  Result Value Ref Range   Squamous Epithelial / LPF 0-5 (A) NONE SEEN   WBC, UA 0-5 0 - 5 WBC/hpf   RBC / HPF 0-5 0 - 5 RBC/hpf   Bacteria, UA FEW (A) NONE SEEN   Urine-Other AMORPHOUS URATES/PHOSPHATES   Comprehensive metabolic panel     Status: Abnormal   Collection Time: 09/14/15  9:34 PM  Result Value Ref Range   Sodium 142 135 - 145 mmol/L   Potassium 3.7 3.5 - 5.1 mmol/L   Chloride 109 101 - 111 mmol/L   CO2 23 22 - 32 mmol/L   Glucose, Bld 124 (H) 65 - 99 mg/dL   BUN 7 6 - 20 mg/dL   Creatinine, Ser 0.69 0.44 - 1.00 mg/dL   Calcium 10.1 8.9 - 10.3 mg/dL   Total Protein 8.6 (H) 6.5 - 8.1 g/dL   Albumin 4.6 3.5 - 5.0 g/dL   AST 23 15 - 41 U/L   ALT 16 14 - 54 U/L   Alkaline Phosphatase 78 38 - 126 U/L   Total Bilirubin 0.6 0.3 - 1.2 mg/dL   GFR calc non Af Amer >60 >60 mL/min   GFR calc Af Amer >60 >60 mL/min    Comment: (NOTE) The eGFR has been calculated using the CKD EPI equation. This calculation has not been validated in all clinical situations. eGFR's persistently <60 mL/min signify possible Chronic Kidney Disease.    Anion gap 10 5 - 15  cbc     Status: Abnormal   Collection Time: 09/14/15  9:34 PM  Result Value Ref  Range   WBC 14.4 (H) 4.0 - 10.5 K/uL   RBC 4.23 3.87 - 5.11 MIL/uL   Hemoglobin 12.0 12.0 - 15.0 g/dL   HCT 36.1 36.0 - 46.0 %   MCV 85.3 78.0 - 100.0 fL   MCH 28.4 26.0 - 34.0 pg   MCHC 33.2 30.0 - 36.0 g/dL   RDW 15.9 (H) 11.5 - 15.5 %   Platelets 421 (H) 150 - 400 K/uL  Ethanol     Status: None   Collection Time: 09/14/15  9:35 PM  Result Value Ref Range   Alcohol, Ethyl (B) <5 <5 mg/dL    Comment:        LOWEST DETECTABLE LIMIT FOR SERUM ALCOHOL IS 5 mg/dL FOR MEDICAL PURPOSES ONLY   Salicylate level     Status: None   Collection Time: 09/14/15  9:35 PM  Result Value Ref Range   Salicylate Lvl <9.6 2.8 - 30.0 mg/dL  Acetaminophen level     Status: Abnormal   Collection Time: 09/14/15  9:35 PM  Result Value Ref Range   Acetaminophen (Tylenol), Serum <10 (L) 10 - 30 ug/mL    Comment:        THERAPEUTIC CONCENTRATIONS VARY SIGNIFICANTLY. A RANGE OF 10-30 ug/mL MAY BE AN EFFECTIVE CONCENTRATION FOR MANY PATIENTS. HOWEVER, SOME ARE BEST TREATED AT CONCENTRATIONS OUTSIDE THIS RANGE. ACETAMINOPHEN CONCENTRATIONS >150 ug/mL AT 4 HOURS AFTER INGESTION AND >50 ug/mL AT 12 HOURS AFTER INGESTION ARE OFTEN ASSOCIATED WITH TOXIC REACTIONS.   Valproic acid level     Status: Abnormal   Collection Time: 09/14/15  9:35 PM  Result Value Ref Range   Valproic Acid Lvl <10 (L) 50.0 - 100.0 ug/mL    Comment: RESULTS CONFIRMED BY MANUAL DILUTION    Current Facility-Administered Medications  Medication Dose Route Frequency Provider Last Rate Last Dose  . acetaminophen (TYLENOL) tablet 650 mg  650 mg Oral Once Quintella Reichert, Melanie Foster      . aspirin EC tablet 81 mg  81 mg Oral Q1200 Quintella Reichert, Melanie Foster      . divalproex (DEPAKOTE) DR tablet 250 mg  250 mg Oral Q8H Quintella Reichert, Melanie Foster   250 mg at 09/15/15 0756  . levothyroxine (SYNTHROID, LEVOTHROID) tablet 25 mcg  25 mcg Oral QAC breakfast Quintella Reichert, Melanie Foster   25 mcg at 09/15/15 0756  . LORazepam (ATIVAN) tablet 0.5 mg  0.5 mg Oral Q8H PRN  Quintella Reichert, Melanie Foster      . metFORMIN (GLUCOPHAGE) tablet 500 mg  500 mg Oral Q breakfast Quintella Reichert, Melanie Foster   500 mg at 09/15/15 0756  . pregabalin (LYRICA) capsule 50 mg  50 mg Oral TID Quintella Reichert, Melanie Foster   50 mg at 09/15/15 1022  . QUEtiapine (SEROQUEL) tablet 400 mg  400 mg Oral QHS Quintella Reichert, Melanie Foster   400 mg at 09/15/15 0127  . verapamil (CALAN) tablet 40 mg  40 mg Oral BID Quintella Reichert, Melanie Foster   40 mg at 09/15/15 1022   Current Outpatient Prescriptions  Medication Sig Dispense Refill  . acetaminophen (TYLENOL) 325 MG tablet Take 2 tablets (650 mg total) by mouth every 6 (six) hours as needed for mild pain, moderate pain or headache.    Marland Kitchen aspirin EC 81 MG tablet Take 81 mg by mouth daily at 12 noon.    . Cyanocobalamin (B-12 PO) Take 1 tablet by mouth daily.    . divalproex (DEPAKOTE) 250 MG DR tablet Take 1 tablet (250 mg total) by mouth every 8 (eight) hours. 30 tablet 0  . levothyroxine (SYNTHROID, LEVOTHROID) 25 MCG tablet Take 1 tablet (25 mcg total) by mouth daily before breakfast. 30 tablet 3  . LORazepam (ATIVAN) 0.5 MG tablet Take 1 tablet (0.5 mg total) by mouth every 8 (eight) hours as needed for anxiety (withdrawal). 15 tablet 0  . LYRICA 50 MG capsule TAKE 1 CAPSULE BY MOUTH THREE TIMES DAILY 90 capsule 0  . metFORMIN (GLUCOPHAGE) 500 MG tablet Take 1 tablet (500 mg total) by mouth daily with breakfast. 60 tablet 1  . omeprazole (PRILOSEC) 20 MG capsule Take 1 capsule (20 mg total) by mouth daily. (Patient taking differently: Take 20 mg by mouth daily as needed (indigestion). ) 30 capsule 2  . QUEtiapine (SEROQUEL) 400 MG tablet Take 1 tablet (400 mg total) by mouth at bedtime. 30 tablet 0  . verapamil (CALAN) 40 MG tablet Take 1 tablet (40 mg total) by mouth 2 (two) times daily. Please tell her to stop the [facility] when she starts this again. 60 tablet 3  . blood glucose meter kit and supplies KIT Dispense based on patient and insurance preference. Use up to four times daily as  directed. (FOR ICD-9 250.00, 250.01). 1 each 0  . Blood Pressure Monitoring (BLOOD PRESSURE CUFF) MISC 1 application by Does not apply route daily after breakfast. 1 each 0  . glucose blood (ONE TOUCH ULTRA TEST) test strip Use as instructed 100 each 12  . Lancets (ONETOUCH ULTRASOFT) lancets Use as instructed 100 each 12  . nicotine (NICODERM CQ - DOSED IN MG/24 HOURS) 21 mg/24hr patch Place 1 patch (21 mg total) onto the skin daily. (Patient not taking: Reported on 04/30/2015) 28 patch 0  . traMADol (ULTRAM) 50 MG tablet Take 2 tablets (100 mg total) by mouth 2 (two) times daily as needed for moderate pain. Reported on 07/14/2015 (Patient not taking: Reported on  09/14/2015) 20 tablet 0    Musculoskeletal: Strength & Muscle Tone: within normal limits Gait & Station: normal Patient leans: N/A  Psychiatric Specialty Exam: Physical Exam  Constitutional: She is oriented to person, place, and time. She appears well-developed and well-nourished.  HENT:  Head: Normocephalic.  Neck: Normal range of motion.  Respiratory: Effort normal.  Musculoskeletal: Normal range of motion.  Neurological: She is alert and oriented to person, place, and time.  Skin: Skin is warm and dry.  Psychiatric: Her mood appears anxious. She exhibits a depressed mood.    Review of Systems  Constitutional: Negative.   Eyes: Negative.   Respiratory: Negative.   Gastrointestinal: Negative.   Genitourinary: Negative.   Musculoskeletal: Negative.   Skin: Negative.   Neurological: Positive for headaches.  Endo/Heme/Allergies: Negative.   Psychiatric/Behavioral: Positive for depression and substance abuse. The patient is nervous/anxious.     Blood pressure (!) 102/52, pulse 84, temperature 97.6 F (36.4 C), temperature source Oral, resp. rate 16, height 5' 5" (1.651 m), weight 79.4 kg (175 lb), SpO2 97 %.Body mass index is 29.12 kg/m.  General Appearance: Disheveled  Eye Contact:  Fair  Speech:  Clear and Coherent and  Normal Rate  Volume:  Normal  Mood:  Depressed  Affect:  Depressed, Flat and Tearful  Thought Process:  Coherent  Orientation:  Full (Time, Place, and Person)  Thought Content:  Rumination  Suicidal Thoughts:  No  Homicidal Thoughts:  No  Memory:  Immediate;   Fair Recent;   Fair Remote;   Fair  Judgement:  Fair  Insight:  Fair  Psychomotor Activity:  Normal  Concentration:  Concentration: Fair and Attention Span: Fair  Recall:  [employer] of Knowledge:  Fair  Language:  Fair  Akathisia:  No  Handed:  Right  AIMS (if indicated):     Assets:  Communication Skills Desire for Improvement Housing Resilience  ADL's:  Intact  Cognition:  WNL  Sleep:        Treatment Plan Summary: Case discussed with Dr. Aracelli Woloszyn;recommendations are: Daily contact with patient to assess and evaluate symptoms and progress in treatment and Medication management  -Continue home medications for medical conditions. -Depakote 250 mg PO Q8 hours for mood stabilization -Seroquel 400 mg PO QHS for mood  Disposition: Recommend psychiatric Inpatient admission when medically cleared. Supportive therapy provided about ongoing stressors.  Melanie Colonel, Melanie Foster [place] 09/15/2015 12:46 PM  Patient seen face-to-face for psychiatric evaluation, chart reviewed and case discussed with the physician extender and developed treatment plan. Reviewed the information documented and agree with the treatment plan. Melanie Pilgrim, Melanie Foster

## 2015-09-15 NOTE — Progress Notes (Signed)
Pt discharged to [facility] via [facility] with belongings and appropriate paperwork.  Pt complained of pain of 8/10 but refused PRN medication for pain.  She denies other concerns.  Report given to admitting RN.

## 2015-09-15 NOTE — BH Assessment (Addendum)
[facility] Assessment Progress Note  Per Corena Pilgrim, MD, this pt requires psychiatric hospitalization at this time.  Letitia Libra, RN, [facility] has assigned pt to [facility] Rm [id number]; they will be ready to receive pt at 13:30.  Pt has signed Voluntary Admission and Consent for Treatment, as well as Consent to Release Information to no one, and signed forms have been faxed to [facility].  Pt's nurse, Nena Jordan, has been notified, and agrees to send original paperwork along with pt via [facility], and to call report to [phone].  Jalene Mullet, [place] Triage Specialist [phone]

## 2015-09-15 NOTE — Progress Notes (Signed)
09/15/15 1404:  LRT went to introduce self to pt.  Pt was asleep.  Victorino Sparrow, LRT/CTRS

## 2015-09-16 ENCOUNTER — Encounter (HOSPITAL_COMMUNITY): Payer: Self-pay

## 2015-09-16 DIAGNOSIS — F3160 Bipolar disorder, current episode mixed, unspecified: Secondary | ICD-10-CM | POA: Diagnosis present

## 2015-09-16 DIAGNOSIS — F1420 Cocaine dependence, uncomplicated: Secondary | ICD-10-CM | POA: Diagnosis present

## 2015-09-16 DIAGNOSIS — F1210 Cannabis abuse, uncomplicated: Secondary | ICD-10-CM | POA: Clinically undetermined

## 2015-09-16 DIAGNOSIS — F3163 Bipolar disorder, current episode mixed, severe, without psychotic features: Secondary | ICD-10-CM | POA: Diagnosis present

## 2015-09-16 LAB — GLUCOSE, CAPILLARY
GLUCOSE-CAPILLARY: 119 mg/dL — AB (ref 65–99)
GLUCOSE-CAPILLARY: 110 mg/dL — AB (ref 65–99)
Glucose-Capillary: 86 mg/dL (ref 65–99)
Glucose-Capillary: 107 mg/dL — ABNORMAL HIGH (ref 65–99)

## 2015-09-16 MED ORDER — NICOTINE 21 MG/24HR TD PT24
21.0000 mg | MEDICATED_PATCH | Freq: Every day | TRANSDERMAL | Status: DC
  Administered 2015-09-17 – 2015-09-23 (×6): 21 mg via TRANSDERMAL
  Filled 2015-09-16 (×10): qty 1

## 2015-09-16 MED ORDER — INSULIN ASPART 100 UNIT/ML ~~LOC~~ SOLN: 0.0000 [IU] | Freq: Three times a day (TID) | SUBCUTANEOUS | Status: DC

## 2015-09-16 MED ORDER — INSULIN ASPART 100 UNIT/ML ~~LOC~~ SOLN: 0.0000 [IU] | Freq: Every day | SUBCUTANEOUS | Status: DC

## 2015-09-16 MED ORDER — ASPIRIN EC 81 MG PO TBEC
81.0000 mg | DELAYED_RELEASE_TABLET | Freq: Every day | ORAL | Status: DC
  Administered 2015-09-16 – 2015-09-22 (×6): 81 mg via ORAL
  Filled 2015-09-16 (×9): qty 1

## 2015-09-16 MED ORDER — VERAPAMIL HCL 40 MG PO TABS
40.0000 mg | ORAL_TABLET | Freq: Two times a day (BID) | ORAL | Status: DC
  Administered 2015-09-16 – 2015-09-23 (×15): 40 mg via ORAL
  Filled 2015-09-16 (×17): qty 1

## 2015-09-16 MED ORDER — LORAZEPAM 0.5 MG PO TABS
0.5000 mg | ORAL_TABLET | Freq: Three times a day (TID) | ORAL | Status: DC | PRN
  Administered 2015-09-16: 0.5 mg via ORAL
  Filled 2015-09-16: qty 1

## 2015-09-16 MED ORDER — HYDROXYZINE HCL 25 MG PO TABS
25.0000 mg | ORAL_TABLET | Freq: Four times a day (QID) | ORAL | Status: DC | PRN
  Administered 2015-09-16 – 2015-09-22 (×11): 25 mg via ORAL
  Filled 2015-09-16 (×12): qty 1

## 2015-09-16 MED ORDER — CHLORDIAZEPOXIDE HCL 25 MG PO CAPS
25.0000 mg | ORAL_CAPSULE | Freq: Four times a day (QID) | ORAL | Status: DC | PRN
  Administered 2015-09-17: 25 mg via ORAL
  Filled 2015-09-16: qty 1

## 2015-09-16 MED ORDER — LEVOTHYROXINE SODIUM 25 MCG PO TABS
25.0000 ug | ORAL_TABLET | Freq: Every day | ORAL | Status: DC
  Administered 2015-09-16 – 2015-09-23 (×8): 25 ug via ORAL
  Filled 2015-09-16 (×10): qty 1

## 2015-09-16 MED ORDER — DIVALPROEX SODIUM 250 MG PO DR TAB
250.0000 mg | DELAYED_RELEASE_TABLET | Freq: Three times a day (TID) | ORAL | Status: DC
  Administered 2015-09-16 – 2015-09-17 (×5): 250 mg via ORAL
  Filled 2015-09-16 (×11): qty 1

## 2015-09-16 MED ORDER — QUETIAPINE FUMARATE 400 MG PO TABS
400.0000 mg | ORAL_TABLET | Freq: Every day | ORAL | Status: DC
  Administered 2015-09-16 – 2015-09-21 (×6): 400 mg via ORAL
  Filled 2015-09-16 (×8): qty 1

## 2015-09-16 MED ORDER — METFORMIN HCL 500 MG PO TABS
500.0000 mg | ORAL_TABLET | Freq: Every day | ORAL | Status: DC
  Administered 2015-09-16 – 2015-09-23 (×8): 500 mg via ORAL
  Filled 2015-09-16 (×10): qty 1

## 2015-09-16 MED ORDER — PREGABALIN 50 MG PO CAPS
50.0000 mg | ORAL_CAPSULE | Freq: Three times a day (TID) | ORAL | Status: DC
  Administered 2015-09-16 – 2015-09-18 (×7): 50 mg via ORAL
  Filled 2015-09-16 (×6): qty 1

## 2015-09-16 NOTE — Progress Notes (Signed)
Pt attended karaoke group this evening.  

## 2015-09-16 NOTE — Clinical Social Work Note (Signed)
CSW spoke w daughter in law, Regino Bellow.  DIL will bring clothes and personal care items after work (second shift) tonight.  Concerned that patient frequently "disappears" from home, is off her medications when not at home, has struggled w substance use throughout her lifetime.  Feels that patient's health has deteriorated due to substance use and that living in community setting has not been successful to date.  Edwyna Shell, LCSW Lead Clinical Social Worker Phone:  [phone]

## 2015-09-16 NOTE — Clinical Social Work Note (Signed)
CSW called daughter at number provided, daughter unavailable until tomorrow, is at work.  Pt requesting clothing and glasses from home.  Edwyna Shell, LCSW Lead Clinical Social Worker Phone:  [phone]
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
## 2015-09-16 NOTE — Progress Notes (Signed)
Admission Note   Pt is a 60 y/o AA female admitted onto the [facility] adult unit. Pt at the time of arrival had already taken HS medications; was able to answer mostly "yes" and "No" questions. Pt at the time of admission endorses moderate anxiety, depression and SI; Pt contracts for safety. Pt however, denies AVH and HI. Pt stated goals are "work on my drug use" and "work on my depression." Support, encouragement, and safe environment provided.  15-minute safety checks initiated and continued. Pt remained calm and cooperative through the admission process.

## 2015-09-16 NOTE — [facility] Suicide Risk Assessment (Signed)
[facility] INPATIENT:  Family/Significant Other Suicide Prevention Education  Suicide Prevention Education:  Education Completed; Regino Bellow, daughter in law, [phone],  (name of family member/significant other) has been identified by the patient as the family member/significant other with whom the patient will be residing, and identified as the person(s) who will aid the patient in the event of a mental health crisis (suicidal ideations/suicide attempt).  With written consent from the patient, the family member/significant other has been provided the following suicide prevention education, prior to the and/or following the discharge of the patient.  The suicide prevention education provided includes the following:  Suicide risk factors  Suicide prevention and interventions  National Suicide Hotline telephone number  [facility] assessment telephone number  [facility] Emergency Assistance [place] and/or Residential Mobile Crisis Unit telephone number  Request made of family/significant other to:  Remove weapons (e.g., guns, rifles, knives), all items previously/currently identified as safety concern.    Remove drugs/medications (over-the-counter, prescriptions, illicit drugs), all items previously/currently identified as a safety concern.  The family member/significant other verbalizes understanding of the suicide prevention education information provided.  The family member/significant other agrees to remove the items of safety concern listed above.  Beverely Pace 09/16/2015, 5:14 PM

## 2015-09-16 NOTE — Tx Team (Signed)
Interdisciplinary Treatment and Diagnostic Plan Update  09/16/2015 Time of Session: 9:30 AM Schwanda Zima MRN: [id number]  Principal Diagnosis: Bipolar disorder, curr episode mixed, severe, w/o psychotic features ([place])  Secondary Diagnoses: Principal Problem:   Bipolar disorder, curr episode mixed, severe, w/o psychotic features ([place]) Active Problems:   Essential hypertension   Type 2 diabetes mellitus with hyperglycemia (HCC)   Hypothyroidism   DM neuropathy, type II diabetes mellitus ([place])   Cocaine use disorder, severe, dependence ([place])   Cannabis use disorder, mild, abuse   Current Medications:  Current Facility-Administered Medications  Medication Dose Route Frequency Provider Last Rate Last Dose  . aspirin EC tablet 81 mg  81 mg Oral Q1200 Lurena Nida, NP   81 mg at 09/16/15 1209  . chlordiazePOXIDE (LIBRIUM) capsule 25 mg  25 mg Oral QID PRN Ursula Alert, MD      . divalproex (DEPAKOTE) DR tablet 250 mg  250 mg Oral Q8H Lurena Nida, NP   250 mg at 09/16/15 1505  . hydrOXYzine (ATARAX/VISTARIL) tablet 25 mg  25 mg Oral Q6H PRN Saramma Eappen, MD      . insulin aspart (novoLOG) injection 0-5 Units  0-5 Units Subcutaneous QHS Saramma Eappen, MD      . insulin aspart (novoLOG) injection 0-9 Units  0-9 Units Subcutaneous TID WC Saramma Eappen, MD      . levothyroxine (SYNTHROID, LEVOTHROID) tablet 25 mcg  25 mcg Oral QAC breakfast Lurena Nida, NP   25 mcg at 09/16/15 0657  . metFORMIN (GLUCOPHAGE) tablet 500 mg  500 mg Oral Q breakfast Lurena Nida, NP   500 mg at 09/16/15 0840  . pregabalin (LYRICA) capsule 50 mg  50 mg Oral TID Lurena Nida, NP   50 mg at 09/16/15 1209  . QUEtiapine (SEROQUEL) tablet 400 mg  400 mg Oral QHS Lurena Nida, NP      . verapamil (CALAN) tablet 40 mg  40 mg Oral BID Lurena Nida, NP   40 mg at 09/16/15 7096   PTA Medications: Prescriptions Prior to Admission  Medication Sig Dispense Refill Last Dose  . acetaminophen (TYLENOL) 325 MG tablet  Take 2 tablets (650 mg total) by mouth every 6 (six) hours as needed for mild pain, moderate pain or headache.   Past Week at Unknown time  . aspirin EC 81 MG tablet Take 81 mg by mouth daily at 12 noon.   Past Week at Unknown time  . blood glucose meter kit and supplies KIT Dispense based on patient and insurance preference. Use up to four times daily as directed. (FOR ICD-9 250.00, 250.01). 1 each 0   . Blood Pressure Monitoring (BLOOD PRESSURE CUFF) MISC 1 application by Does not apply route daily after breakfast. 1 each 0   . Cyanocobalamin (B-12 PO) Take 1 tablet by mouth daily.   Past Week at Unknown time  . divalproex (DEPAKOTE) 250 MG DR tablet Take 1 tablet (250 mg total) by mouth every 8 (eight) hours. 30 tablet 0 Past Week at Unknown time  . glucose blood (ONE TOUCH ULTRA TEST) test strip Use as instructed 100 each 12   . Lancets (ONETOUCH ULTRASOFT) lancets Use as instructed 100 each 12   . levothyroxine (SYNTHROID, LEVOTHROID) 25 MCG tablet Take 1 tablet (25 mcg total) by mouth daily before breakfast. 30 tablet 3 Past Week at Unknown time  . LORazepam (ATIVAN) 0.5 MG tablet Take 1 tablet (0.5 mg total) by mouth every 8 (eight)  hours as needed for anxiety (withdrawal). 15 tablet 0 Past Week at Unknown time  . LYRICA 50 MG capsule TAKE 1 CAPSULE BY MOUTH THREE TIMES DAILY 90 capsule 0 Past Week at Unknown time  . metFORMIN (GLUCOPHAGE) 500 MG tablet Take 1 tablet (500 mg total) by mouth daily with breakfast. 60 tablet 1 Past Week at Unknown time  . nicotine (NICODERM CQ - DOSED IN MG/24 HOURS) 21 mg/24hr patch Place 1 patch (21 mg total) onto the skin daily. (Patient not taking: Reported on 04/30/2015) 28 patch 0 Not Taking at Unknown time  . omeprazole (PRILOSEC) 20 MG capsule Take 1 capsule (20 mg total) by mouth daily. (Patient taking differently: Take 20 mg by mouth daily as needed (indigestion). ) 30 capsule 2 Past Week at Unknown time  . QUEtiapine (SEROQUEL) 400 MG tablet Take 1 tablet  (400 mg total) by mouth at bedtime. 30 tablet 0 Past Week at Unknown time  . traMADol (ULTRAM) 50 MG tablet Take 2 tablets (100 mg total) by mouth 2 (two) times daily as needed for moderate pain. Reported on 07/14/2015 (Patient not taking: Reported on 09/14/2015) 20 tablet 0 Not Taking at Unknown time  . verapamil (CALAN) 40 MG tablet Take 1 tablet (40 mg total) by mouth 2 (two) times daily. Please tell her to stop the [facility] when she starts this again. 60 tablet 3 Past Week at Unknown time    Treatment Modalities: Medication Management, Group therapy, Case management,  1 to 1 session with clinician, Psychoeducation, Recreational therapy.   Physician Treatment Plan for Primary Diagnosis: Bipolar disorder, curr episode mixed, severe, w/o psychotic features ([place]) Long Term Goal(s): Improvement in symptoms so as ready for discharge   Short Term Goals: Ability to disclose and discuss suicidal ideas, Ability to identify and develop effective coping behaviors will improve, Compliance with prescribed medications will improve and Ability to identify triggers associated with substance abuse/mental health issues will improve  Medication Management: Evaluate patient's response, side effects, and tolerance of medication regimen.  Therapeutic Interventions: 1 to 1 sessions, Unit Group sessions and Medication administration.  Evaluation of Outcomes: Not Met  Physician Treatment Plan for Secondary Diagnosis: Principal Problem:   Bipolar disorder, curr episode mixed, severe, w/o psychotic features ([place]) Active Problems:   Essential hypertension   Type 2 diabetes mellitus with hyperglycemia (HCC)   Hypothyroidism   DM neuropathy, type II diabetes mellitus ([place])   Cocaine use disorder, severe, dependence ([place])   Cannabis use disorder, mild, abuse  Long Term Goal(s): Improvement in symptoms so as ready for discharge  Short Term Goals: Ability to identify changes in lifestyle to reduce recurrence of  condition will improve, Ability to disclose and discuss suicidal ideas, Ability to identify and develop effective coping behaviors will improve and Ability to identify triggers associated with substance abuse/mental health issues will improve  Medication Management: Evaluate patient's response, side effects, and tolerance of medication regimen.  Therapeutic Interventions: 1 to 1 sessions, Unit Group sessions and Medication administration.  Evaluation of Outcomes: Not Met   RN Treatment Plan for Primary Diagnosis: Bipolar disorder, curr episode mixed, severe, w/o psychotic features ([place]) Long Term Goal(s): Knowledge of disease and therapeutic regimen to maintain health will improve  Short Term Goals: Ability to remain free from injury will improve and Compliance with prescribed medications will improve  Medication Management: RN will administer medications as ordered by provider, will assess and evaluate patient's response and provide education to patient for prescribed medication. RN will report any  adverse and/or side effects to prescribing provider.  Therapeutic Interventions: 1 on 1 counseling sessions, Psychoeducation, Medication administration, Evaluate responses to treatment, Monitor vital signs and CBGs as ordered, Perform/monitor CIWA, COWS, AIMS and Fall Risk screenings as ordered, Perform wound care treatments as ordered.  Evaluation of Outcomes: Not Met   LCSW Treatment Plan for Primary Diagnosis: Bipolar disorder, curr episode mixed, severe, w/o psychotic features ([place]) Long Term Goal(s): Safe transition to appropriate next level of care at discharge, Engage patient in therapeutic group addressing interpersonal concerns.  Short Term Goals: Engage patient in aftercare planning with referrals and resources  Therapeutic Interventions: Assess for all discharge needs, 1 to 1 time with Social worker, Explore available resources and support systems, Assess for adequacy in community  support network, Educate family and significant other(s) on suicide prevention, Complete Psychosocial Assessment, Interpersonal group therapy.  Evaluation of Outcomes: Not Met   Progress in Treatment: Attending groups: Yes. Participating in groups: Yes. Taking medication as prescribed: Yes. Toleration medication: Yes. Family/Significant other contact made: No, will contact:  daughter in law w patient consent Patient understands diagnosis: Yes. Discussing patient identified problems/goals with staff: Yes. Medical problems stabilized or resolved: No. and As evidenced by:  pt asking for medications for neuropathy, also states she needs routine health screenings Denies suicidal/homicidal ideation: No. and As evidenced by:  admission for suicidal ideation, multiple community stressors, coping skills inadequate for community stressors Issues/concerns per patient self-inventory: No. Other: none  New problem(s) identified: Yes, Describe:  patient needs reading glasses, clothes, personal care items from family  New Short Term/Long Term Goal(s):  Discharge Plan or Barriers:   Reason for Continuation of Hospitalization: Anxiety Depression Medication stabilization Suicidal ideation  Estimated Length of Stay: 3 - 5 days  Attendees: Patient: 09/16/2015 3:34 PM  Physician: Gabriel Earing MD 09/16/2015 3:34 PM  Nursing: Reeves Forth RN 09/16/2015 3:34 PM  RN Care Manager: 09/16/2015 3:34 PM  Social Worker: Eusebio Me LCSW 09/16/2015 3:34 PM  Recreational Therapist:  09/16/2015 3:34 PM  Other:  09/16/2015 3:34 PM  Other:  09/16/2015 3:34 PM  Other: 09/16/2015 3:34 PM    Scribe for Treatment Team: Beverely Pace, LCSW 09/16/2015 3:34 PM

## 2015-09-16 NOTE — BHH Suicide Risk Assessment (Signed)
[facility] Admission Suicide Risk Assessment   Nursing information obtained from:  Patient Demographic factors:  Low socioeconomic status, Unemployed Current Mental Status:  Suicidal ideation indicated by patient Loss Factors:  Decline in physical health, Financial problems / change in socioeconomic status Historical Factors:  NA Risk Reduction Factors:  Living with another person, especially a relative  Total Time spent with patient: 30 minutes Principal Problem: Bipolar disorder, curr episode mixed, severe, w/o psychotic features ([place]) Diagnosis:   Patient Active Problem List   Diagnosis Date Noted  . Bipolar disorder, curr episode mixed, severe, w/o psychotic features ([place]) [F31.63] 09/16/2015  . Cocaine use disorder, severe, dependence ([place]) [F14.20] 09/16/2015  . Cannabis use disorder, mild, abuse [F12.10] 09/16/2015  . Polysubstance dependence ([place]) [F19.20] 09/15/2015  . Suicidal ideation [R45.851] 07/14/2015  . Hypertension [I10] 07/14/2015  . Drug overdose [T50.901A] 07/14/2015  . Hypokalemia [E87.6] 07/14/2015  . Cocaine abuse [F14.10] 07/14/2015  . Marijuana abuse [F12.10] 07/14/2015  . Chronic back pain [M54.9, G89.29] 07/14/2015  . Suicide attempt ([place]) [T14.91]   . Herpes simplex [B00.9] 04/28/2015  . Tobacco abuse [Z72.0] 04/28/2015  . Chronic bronchitis ([place]) [J42] 04/28/2015  . DM neuropathy, type II diabetes mellitus ([place]) [E11.40] 04/28/2015  . Bipolar 1 disorder, mixed, severe ([place]) [F31.63] 04/22/2015  . Hyperlipidemia [E78.5] 04/22/2015  . Essential hypertension [I10] 04/22/2015  . Type 2 diabetes mellitus with hyperglycemia ([place]) [E11.65] 04/22/2015  . Metabolic acidosis [id number] 04/22/2015  . Hypothyroidism [E03.9] 04/22/2015   Subjective Data: Patient states ' I have Bipolar disorder, I also relapsed on cocaine. I want something for anxiety - Klonopin helped in the past. I do not know how to answer the question whether I am suicidal or not . I will not answer if  I an homicidal or not. I want to get out of here right now if you cannot give me what I want."    Continued Clinical Symptoms:  Alcohol Use Disorder Identification Test Final Score (AUDIT): 0 The "Alcohol Use Disorders Identification Test", Guidelines for Use in Primary Care, Second Edition.  World Pharmacologist [facility]). Score between 0-7:  no or low risk or alcohol related problems. Score between 8-15:  moderate risk of alcohol related problems. Score between 16-19:  high risk of alcohol related problems. Score 20 or above:  warrants further diagnostic evaluation for alcohol dependence and treatment.   CLINICAL FACTORS:   Bipolar Disorder:   Mixed State Alcohol/Substance Abuse/Dependencies Unstable or Poor Therapeutic Relationship Previous Psychiatric Diagnoses and Treatments Medical Diagnoses and Treatments/Surgeries   Musculoskeletal: Strength & Muscle Tone: within normal limits Gait & Station: in wheelchair Patient leans: N/A  Psychiatric Specialty Exam: Physical Exam  Nursing note and vitals reviewed.   Review of Systems  Psychiatric/Behavioral: Positive for depression, substance abuse and suicidal ideas (states " I do not know). The patient is nervous/anxious and has insomnia.   All other systems reviewed and are negative.   Blood pressure 106/62, pulse 69, temperature 98.3 F (36.8 C), temperature source Oral, resp. rate 16, height 5\' 5"  (1.651 m), weight 79.4 kg (175 lb).Body mass index is 29.12 kg/m.  General Appearance: Fairly Groomed  Eye Contact:  Minimal  Speech:  Slow  Volume:  Decreased  Mood:  Angry, Anxious, Depressed and Irritable  Affect:  Labile  Thought Process:  Goal Directed and Descriptions of Associations: Circumstantial  Orientation:  Full (Time, Place, and Person)  Thought Content:  Rumination  Suicidal Thoughts:  dId not answer  Homicidal Thoughts:  says " I  will not answer"   Memory:  Immediate;   Fair Recent;   Fair Remote;   Fair   Judgement:  Impaired  Insight:  Shallow  Psychomotor Activity:  Restlessness  Concentration:  Concentration: Poor and Attention Span: Poor  Recall:  [employer] of Knowledge:  Fair  Language:  Fair  Akathisia:  No  Handed:  Right  AIMS (if indicated):     Assets:  Desire for Improvement  ADL's:  Intact  Cognition:  WNL  Sleep:  Number of Hours: 3.5      COGNITIVE FEATURES THAT CONTRIBUTE TO RISK:  Closed-mindedness, Polarized thinking and Thought constriction (tunnel vision)    SUICIDE RISK:   Moderate:  Frequent suicidal ideation with limited intensity, and duration, some specificity in terms of plans, no associated intent, good self-control, limited dysphoria/symptomatology, some risk factors present, and identifiable protective factors, including available and accessible social support.   PLAN OF CARE: Patient with Bipolar do as well as cocaine abuse - presents with worsening sx of suicidal thoughts with plan. Pt today seen as irritable, demanding - wants Klonopin. Pt was discussed with and educated about BZD and that we could use alternative treatment for her anxiety sx. Pt became very irritable , did not answer when writer asked about suicidality of about HI.  Please also see H&P per NP.   I certify that inpatient services furnished can reasonably be expected to improve the patient's condition.  Tanner Vigna, MD 09/16/2015, 11:46 AM

## 2015-09-16 NOTE — Progress Notes (Signed)
D: Pt  labile this morning and afternoon. Pt irritable, argumentative and easily agitated. Pt have yelled at four different staff members this morning. Pt entitled, demanding, attention seeking and blames others when she cannot have things go her way. Pt requesting to be moved to another hall for mental help treatment. Writer explained to pt that the entire unit focuses on mental health tx. Writer informed pt that she's assigned to the right hall for substance abuse, depression and suicidal thoughts. Pt appears to be minimizing the reason why she's here for tx. AC notified of pt compliant. Pt agreed to remain on 300 hall. Pt reports depression 6/10. Pt endorses suicidal thoughts with no plan or intent. Pt verbally contracts for safety. Pt continues to use wheelchair due to chronic leg pain. Pt requesting to have Lyrica increased. Dr. Shea Evans made aware. Pt then requested to have tramadol for pain.Herbert Pun, NP., notified.  A: Medications administered as ordered per MD. Verbal support provided. Pt encouraged to attend groups. 15 minute checks performed for safety.  R: Pt verbalizes understanding of treatment plan. Reinforcement needed.

## 2015-09-16 NOTE — Tx Team (Signed)
Initial Treatment Plan 09/16/2015 3:55 AM [name] [id number]    PATIENT STRESSORS: Financial difficulties Health problems Marital or family conflict Substance abuse   PATIENT STRENGTHS: Ability for insight Capable of independent living Communication skills   PATIENT IDENTIFIED PROBLEMS: "Work on my drug use"  "Work on my depression"  Depression  Anxiety  Risk for suicide  Substance Abuse           DISCHARGE CRITERIA:  Ability to meet basic life and health needs Safe-care adequate arrangements made Verbal commitment to aftercare and medication compliance Withdrawal symptoms are absent or subacute and managed without 24-hour nursing intervention  PRELIMINARY DISCHARGE PLAN: Outpatient therapy Return to previous living arrangement  PATIENT/FAMILY INVOLVEMENT: This treatment plan has been presented to and reviewed with the patient, Melanie Foster, and/or family member.  The patient and family have been given the opportunity to ask questions and make suggestions.  Leia Alf, RN 09/16/2015, 3:55 AM

## 2015-09-16 NOTE — BHH Group Notes (Signed)
[place] LCSW Group Therapy  09/16/2015 4:48 PM  Type of Therapy:  Group Therapy  Participation Level:  Minimal  Participation Quality:  Drowsy  Affect:  Flat  Cognitive:Approriate    Insight:  Limited  Engagement in Therapy:  Limited  Modes of Intervention:  Discussion, Education, Socialization and Support  Summary of Progress/Problems:Balance in life: Patients will discuss the concept of balance and how it looks and feels to be unbalanced. Pt will identify areas in their life that is unbalanced and ways to become more balanced. Pt attended group and stayed most of the time. Pt slept throughout group.   [place] 09/16/2015, 4:48 PM
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
## 2015-09-17 LAB — GLUCOSE, CAPILLARY
GLUCOSE-CAPILLARY: 103 mg/dL — AB (ref 65–99)
Glucose-Capillary: 94 mg/dL (ref 65–99)
Glucose-Capillary: 127 mg/dL — ABNORMAL HIGH (ref 65–99)
Glucose-Capillary: 123 mg/dL — ABNORMAL HIGH (ref 65–99)

## 2015-09-17 LAB — LIPID PANEL
CHOL/HDL RATIO: 3.6 ratio
CHOLESTEROL: 162 mg/dL (ref 0–200)
HDL: 45 mg/dL (ref >40)
LDL CALC: 95 mg/dL (ref 0–99)
Triglycerides: 108 mg/dL (ref <150)
VLDL: 22 mg/dL (ref 0–40)

## 2015-09-17 LAB — TSH: TSH: 3.504 u[IU]/mL (ref 0.350–4.500)

## 2015-09-17 MED ORDER — MELOXICAM 7.5 MG PO TABS
7.5000 mg | ORAL_TABLET | Freq: Every day | ORAL | Status: DC
  Administered 2015-09-17 – 2015-09-23 (×7): 7.5 mg via ORAL
  Filled 2015-09-17 (×12): qty 1

## 2015-09-17 MED ORDER — LORAZEPAM 0.5 MG PO TABS
0.5000 mg | ORAL_TABLET | Freq: Four times a day (QID) | ORAL | Status: DC | PRN
  Administered 2015-09-17 – 2015-09-22 (×6): 0.5 mg via ORAL
  Filled 2015-09-17 (×6): qty 1

## 2015-09-17 MED ORDER — DIVALPROEX SODIUM ER 250 MG PO TB24
250.0000 mg | ORAL_TABLET | Freq: Every day | ORAL | Status: DC
  Administered 2015-09-17: 250 mg via ORAL
  Filled 2015-09-17 (×4): qty 1

## 2015-09-17 NOTE — Progress Notes (Signed)
Patient ID: Melanie Foster, female   DOB: [birth year], 60 y.o.   MRN: [id number]  Melanie Foster states she feels bullied on her hall. She states other patients are stating "you should be on the adolescent unit" and "they are going to cut my head off." Pt is requesting to either be discharged or change halls. CSW spoke with RN.   [place] MSW, [place]  09/17/2015 4:51 PM

## 2015-09-17 NOTE — Progress Notes (Signed)
Patient ID: Melanie Foster, female   DOB: 1955/11/07, 60 y.o.   MRN: [id number]  Patient moved to bed 500-02 after speaking with patient about her concerns, LCSW Candace, Charge RN Patty, and A/C Linsey.
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
## 2015-09-17 NOTE — Progress Notes (Signed)
D: Pt at the time of assessment endorsed moderate anxiety and depression; states, "I am still anxious; my depression only comes when I'm alone." Pt also complained of bilateral leg pain of 4. Pt however, denied SI, HI or AVH; Pt remained calm and cooperative. A: Medications offered as prescribed.  Support, encouragement, and safe environment provided.  15-minute safety checks continue. R: Pt was med compliant.  Pt attended [place] group. Safety checks continue.

## 2015-09-17 NOTE — Progress Notes (Signed)
Recreation Therapy Notes  Date: 09/17/15 Time: 0930 Location: 300 Hall Dayroom  Group Topic: Stress Management  Goal Area(s) Addresses:  Patient will verbalize importance of using healthy stress management.  Patient will identify positive emotions associated with healthy stress management.   Intervention: Stress Management  Activity :  Guided Imagery.  LRT introduced patients to the technique of guided imagery.  LRT read script to allow patients to participate in the technique.  Patients were to listen and follow along as LRT read script.  Education:  Stress Management, Discharge Planning.   Education Outcome: Acknowledges edcuation/In group clarification offered/Needs additional education  Clinical Observations/Feedback:  Pt did not attend group.   Victorino Sparrow, LRT/CTRS         Victorino Sparrow A 09/17/2015 12:17 PM

## 2015-09-17 NOTE — Progress Notes (Signed)
Adult Psychoeducational Group Note  Date:  09/17/2015 Time:  10:19 PM  Group Topic/Focus:  Wrap-Up Group:   The focus of this group is to help patients review their daily goal of treatment and discuss progress on daily workbooks.   Participation Level:  Active  Participation Quality:  Appropriate  Affect:  Flat  Cognitive:  Oriented  Insight: Limited  Engagement in Group:  Engaged  Modes of Intervention:  Socialization and Support  Additional Comments:  Patient attended and participated in group tonight. She reports that she had a bad day today. She was angry. She did not get to see her regular SW today. She spoke with her doctor. She believe her doctor is not giving her enough medication. The patient advised that she is not use to someone else in control of her life. Today she went to group and meals. She think she has been overeating due to depression.  Salley Scarlet [facility] 09/17/2015, 10:19 PM

## 2015-09-17 NOTE — Progress Notes (Signed)
D: Patient denies SI/HI and A/V hallucinations; patient reports sleep is poor; reports appetite is fair; reports energy level is low ; reports ability to pay attention to; rates depression as 9/10; rates hopelessness 10/10; rates anxiety as 10/10  A: Monitored q 15 minutes; patient encouraged to attend groups; patient educated about medications; patient given medications per physician orders; patient encouraged to express feelings and/or concerns  R: Patient is cooperative; patient is irritable and agitated when the answer she wants is noppatient's interaction with staff and peers is very assertive; patient was able to set goal to talk with staff 1:1 when having feelings of SI; patient is taking medications as prescribed and tolerating medications;

## 2015-09-18 DIAGNOSIS — R45851 Suicidal ideations: Secondary | ICD-10-CM

## 2015-09-18 LAB — GLUCOSE, CAPILLARY
GLUCOSE-CAPILLARY: 140 mg/dL — AB (ref 65–99)
Glucose-Capillary: 86 mg/dL (ref 65–99)
Glucose-Capillary: 108 mg/dL — ABNORMAL HIGH (ref 65–99)

## 2015-09-18 LAB — HEMOGLOBIN A1C
HEMOGLOBIN A1C: 5.8 % — AB (ref 4.8–5.6)
Mean Plasma Glucose: 120 mg/dL

## 2015-09-18 MED ORDER — PREGABALIN 100 MG PO CAPS
100.0000 mg | ORAL_CAPSULE | Freq: Once | ORAL | Status: AC
  Administered 2015-09-18: 100 mg via ORAL
  Filled 2015-09-18: qty 1

## 2015-09-18 MED ORDER — PREGABALIN 100 MG PO CAPS
100.0000 mg | ORAL_CAPSULE | Freq: Three times a day (TID) | ORAL | Status: DC
  Administered 2015-09-18 – 2015-09-20 (×5): 100 mg via ORAL
  Filled 2015-09-18 (×5): qty 1

## 2015-09-18 MED ORDER — DIVALPROEX SODIUM ER 500 MG PO TB24
500.0000 mg | ORAL_TABLET | Freq: Every day | ORAL | Status: DC
  Administered 2015-09-18: 500 mg via ORAL
  Filled 2015-09-18 (×4): qty 1
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
## 2015-09-18 NOTE — Progress Notes (Signed)
Adult Psychoeducational Group Note  Date:  09/18/2015 Time:  11:05 AM  Group Topic/Focus:  Making Healthy Choices:   The focus of this group is to help patients identify negative/unhealthy choices they were using prior to admission and identify positive/healthier coping strategies to replace them upon discharge. Personal Choices and Values:   The focus of this group is to help patients assess and explore the importance of values in their lives, how their values affect their decisions, how they express their values and what opposes their expression.    Participation Level:  Active  Participation Quality:  Appropriate and Attentive  Affect:  Appropriate  Cognitive:  Alert and Appropriate  Insight: Appropriate and Good  Engagement in Group:  Engaged  Modes of Intervention:  Activity, Discussion and Education    Mart Piggs 09/18/2015, 11:05 AM

## 2015-09-18 NOTE — Progress Notes (Signed)
Melanie Foster had been up and visible in milieu this evening, did attend and participate in evening group activity. Melanie Foster spoke about how she was on another hall earlier and how everyone was bullying her and making fun of her and now feels better that she is on a different hall. Melanie Foster has also complained about burning in her legs and anxiety and spoke about feeling frustrated that the doctor won't give her the medications that she needs. Melanie Foster was able to receive all medications without incident this evening. A. Support and encouragement provided, medication education given. Melanie Foster verbalized understanding, safety maintained.

## 2015-09-18 NOTE — Progress Notes (Signed)
Melanie Foster has been up and visible this evening, did attend evening group activity. Melanie Foster has been intrusive and confrontational with peers at times and needed much re-direction from staff. Melanie Foster become confrontational in regards to which television program the group could watch and began to call everyone in the dayroom children. Melanie Foster was able to respond appropriately to the re-direction and did receive bedtime medications without incident. A. Support and encouragement provided. R. Safety maintained, will continue to monitor.

## 2015-09-18 NOTE — Progress Notes (Signed)
Adult Psychoeducational Group Note  Date:  09/18/2015 Time:  9:09 PM  Group Topic/Focus:  Wrap-Up Group:   The focus of this group is to help patients review their daily goal of treatment and discuss progress on daily workbooks.   Participation Level:  Active  Participation Quality:  Appropriate  Affect:  Appropriate  Cognitive:  Appropriate  Insight: Appropriate  Engagement in Group:  Engaged  Modes of Intervention:  Discussion  Additional Comments:  The patient expressed that she attended all groups The patient also said that she had a good day.  [place], [place] 09/18/2015, 9:09 PM

## 2015-09-18 NOTE — BHH Group Notes (Signed)
[place] Group Notes:  (Clinical Social Work)  09/18/2015  11:15-12:00PM  Summary of Progress/Problems:   Today's process group involved patients discussing their feelings related to being hospitalized, as well as how they can use their present feelings to create a plan for staying out of the hospital in the future. The patient expressed that she self-sabotages while outside the hospital by relapsing on drugs.  In the hospital, she admitted that she self-sabotages by "focusing on bu--sh--" so that attention is taken away from her problems.  She stated she can find something wrong at any given time, just in order to avoid having to truly focus on her own problems.  She asked why there are no AA/NA meetings in the hospital, and why she can't be helped to find a temporary sponsor while hospitalized.  She was encouraged by CSW to talk to her psychiatric provider today to seek permission to attend meetings on [address] hall.  Type of Therapy:  Group Therapy - Process  Participation Level:  Active  Participation Quality:  Attentive and Sharing  Affect:  Blunted and Irritable  Cognitive:  Appropriate  Insight:  Improving  Engagement in Therapy:  Improving  Modes of Intervention:  Exploration, Discussion  Selmer Dominion, LCSW 09/18/2015, 12:21 PM

## 2015-09-18 NOTE — Progress Notes (Signed)
DAR NOTE: Patient appears irritable and anxious during assessment.  Denies pain, auditory and visual hallucinations.  Rates depression at 8, hopelessness at 8, and anxiety at 10.  Reports withdrawal symptoms of chilling, agitation and irritability.  Maintained on routine safety checks.  Medications given as prescribed.  Support and encouragement offered as needed.  Attended group and participated.  States goal for today is "work on my anxiety."  Patient observed socializing with peers in the dayroom.  Patient requested and received Vistaril 25 mg for complain of anxiety.

## 2015-09-19 LAB — GLUCOSE, CAPILLARY
GLUCOSE-CAPILLARY: 100 mg/dL — AB (ref 65–99)
Glucose-Capillary: 105 mg/dL — ABNORMAL HIGH (ref 65–99)
Glucose-Capillary: 121 mg/dL — ABNORMAL HIGH (ref 65–99)
Glucose-Capillary: 125 mg/dL — ABNORMAL HIGH (ref 65–99)

## 2015-09-19 MED ORDER — DIVALPROEX SODIUM ER 500 MG PO TB24
750.0000 mg | ORAL_TABLET | Freq: Every day | ORAL | Status: DC
  Administered 2015-09-19 – 2015-09-21 (×3): 750 mg via ORAL
  Filled 2015-09-19 (×5): qty 1

## 2015-09-19 NOTE — BHH Group Notes (Signed)
[place] Group Notes:  (Clinical Social Work)  09/19/2015  11:00AM-12:00PM  Summary of Progress/Problems:  The main focus of today's process group was to listen to a variety of genres of music and to identify that different types of music provoke different responses.  The patient then was able to identify personally what was soothing for them, as well as energizing, as well as how patient can personally use this knowledge in sleep habits, with depression, and with other symptoms.  The patient expressed at the beginning of group the overall feeling of angry and anxious.  She said this is because she does not know when she is discharging or what the plan is going to be, therefore is anxious.  Another patient said at the beginning of group that he was angry at doctor, and she became accusational toward CSW, "you all stick together" and complained that CSW should have addressed the other patient's concerns instead of just moving on.  The other patient stated he did not want that to take up the group time, but she remained angry.  She raised her hand numerous times to have various songs turned off, and complained that CSW "was doing good at first then started playing all this sh-."  When another patient requested a specific song, CSW responded that requests are not taken (because words need to be reviewed first), this patient again became angry and stormed out of the room.  She waited about 20 minutes then returned and resumed her complaints about some of the music, said she liked some of it as it made her calm, did remain until the end  Type of Therapy:  Music Therapy   Participation Level:  Active  Participation Quality:  Attentive and Sharing and Resistant  Affect:  Blunted  Cognitive:  Oriented  Insight:  Limited  Engagement in Therapy:  Resistant  Modes of Intervention:   Activity, Exploration  Selmer Dominion, LCSW 09/19/2015

## 2015-09-19 NOTE — Progress Notes (Signed)
DAR NOTE: Patient remained irritable, anxious and argumentative with staff.  Patient unwilling to participate or follow unit protocol.  Denies auditory and visual hallucinations.  Rates depression at 7, hopelessness at 7, and anxiety at 6.  Maintained on routine safety checks.  Medications given as prescribed.  Support and encouragement offered as needed.  Attended group and participated.  Patient observed socializing with peers in the dayroom.  Patient requested and received Vistaril 25 mg for complain of anxiety.  States goal is staying positive and getting my things packed.

## 2015-09-19 NOTE — Progress Notes (Signed)
[place] Group Notes:  (Nursing/MHT/Case Management/Adjunct)  Date:  09/19/2015  Time:  9:15 PM  Type of Therapy:  Psychoeducational Skills  Participation Level:  Active  Participation Quality:  Appropriate  Affect:  Appropriate  Cognitive:  Appropriate  Insight:  Appropriate  Engagement in Group:  Developing/Improving  Modes of Intervention:  Education  Summary of Progress/Problems: Patient shared with the group that she had a pretty good day overall since she was able to laugh with her peers. THe patient also expressed that she prefers to be by herself. In terms of the theme for the day, her support sytem is unknown at this time.   [place]on Broadwater S 09/19/2015, 9:15 PM

## 2015-09-19 NOTE — Progress Notes (Signed)
Patient ID: Melanie Foster, female   DOB: 12/29/1955, 60 y.o.   MRN: [id number] D: Client visible on the unit, irritable at times, complaining. Client reports "worried about getting into treatment, they don't have no beds" A: Writer provided emotional support, encouraged client to remain hopeful that placement will be found. Medications reviewed, administered as ordered. Staff will monitor q49min for safety. R: Client is safe on the unit, attended group.
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
## 2015-09-19 NOTE — Progress Notes (Addendum)
Patient ID: Melanie Foster, female   DOB: [birth year], 60 y.o.   MRN: [id number] D: Assumed care patient @ 2330. Patient in bed sleeping. Respiration regular and unlabored. No sign of distress noted at this time A: 15 mins checks for safety. Medication administered for anxiety. R: Patient remains safe.

## 2015-09-20 LAB — GLUCOSE, CAPILLARY
GLUCOSE-CAPILLARY: 103 mg/dL — AB (ref 65–99)
Glucose-Capillary: 95 mg/dL (ref 65–99)
Glucose-Capillary: 129 mg/dL — ABNORMAL HIGH (ref 65–99)
Glucose-Capillary: 190 mg/dL — ABNORMAL HIGH (ref 65–99)

## 2015-09-20 MED ORDER — ACETAMINOPHEN 325 MG PO TABS
650.0000 mg | ORAL_TABLET | Freq: Four times a day (QID) | ORAL | Status: DC | PRN
  Administered 2015-09-20 – 2015-09-22 (×3): 650 mg via ORAL
  Filled 2015-09-20 (×3): qty 2

## 2015-09-20 MED ORDER — PREGABALIN 100 MG PO CAPS
100.0000 mg | ORAL_CAPSULE | Freq: Three times a day (TID) | ORAL | Status: DC
  Administered 2015-09-20 – 2015-09-23 (×9): 100 mg via ORAL
  Filled 2015-09-20 (×9): qty 1

## 2015-09-20 NOTE — Plan of Care (Signed)
Problem: Coping: Goal: Ability to identify and develop effective coping behavior will improve Outcome: Progressing Client reports coping with anxiety AEB "try not to get myself up set by going be alone"

## 2015-09-20 NOTE — Progress Notes (Signed)
Nursing Note 09/20/2015 [id number]  Data Patient being monitored inpatient for SI and cocaine use disorder.  Reports sleeping fair with sleep med.  Rates depression 9/10, hopelessness 9/10, and anxiety 9/10. C/O chronic neck/back pain, taking scheduled lyrica, declines need for tylenol/ibuprofen.  Requesting tramadol. Affect blunted mood "depressed and anxious."  Denies HI, SI, AVH.  Patient approached nurse with relaxed posture and steady speech rate before lunch stating "I am getting really manic, I need something for my mania.  I can feel the crash coming."  Patient reported anxiety at that time.  Attending groups, spending free time in day area.  Patient intrusive and demanding at times, requesting to see the social worker multiple times after patient assured she would see social worker today.  Also continues to say "Maybe I should just go home to a place where I can get the medicine I need to be comfortable.  I am getting under-medicated here."  Action Spoke with patient 1:1, nurse offered support to patient throughout shift.  Reassurance given multiple times throughout shift.  Ativan given for anxiety before lunch.  Continues to be monitored on 15 minute checks for safety.  Response Ativan effective.  Patient remains safe and appropriate, though somewhat intrusive and demanding throughout shift.

## 2015-09-20 NOTE — [facility] Group Notes (Signed)
[place] LCSW Group Therapy  09/20/2015 1:15 pm  Type of Therapy: Process Group Therapy  Participation Level:  Active  Participation Quality:  Appropriate  Affect:  Flat  Cognitive:  Oriented  Insight:  Improving  Engagement in Group:  Limited  Engagement in Therapy:  Limited  Modes of Intervention:  Activity, Clarification, Education, Problem-solving and Support  Summary of Progress/Problems: Today's group addressed the issue of overcoming obstacles.  Patients were asked to identify their biggest obstacle post d/c that stands in the way of their on-going success, and then problem solve as to how to manage this.  Reminded me that we needed to talk individually at the beginning of group.  Talked about the lack of support from family, and how moving to [place] has been a mistake.  Went on to talk about how unhappy she is that she cannot get into a "90 day program" directly from here.  Was willing to talk about her challenges with sobriety, and how she wants to find a sponsor for support.  Roque Lias B 09/20/2015   4:19 PM

## 2015-09-20 NOTE — Progress Notes (Signed)
Recreation Therapy Notes  Date:09/20/15 Time:1000 Location:[address] Dayroom  Group Topic:Communication  Goal Area(s) Addresses: Patient will effectively communicate with peers in group.  Patient will verbalize benefit of healthy communication. Patient will verbalize positive effect of healthy communication on post d/c goals.  Patient will identify communication techniques that made activity effective for group.   Behavioral Response:Attentive  Intervention:Dry erase board, eraser, dry erase marker, strips of paper with random words  Activity:Pictionary. LRT are divided the group into two teams. Each person would get a turn. Each person would draw a strip of paer from the container. The person has to draw whatever is on the paper, on the board. While they are drawing, their team is trying to guess what it is they are drawing. If the team guesses the picture they get a point.   Education:Communication, Discharge Planning  Education Outcome:Acknowledges understanding/In group clarification offered/Needs additional education.   Clinical Observations/Feedback:Pt watched as her peers engaged in the activity.  Pt would try to guess the pictures but she didn't want to try to draw anything.  Pt was social with peers.   Victorino Sparrow, LRT/CTRS        Victorino Sparrow A 09/20/2015 12:11 PM

## 2015-09-20 NOTE — Progress Notes (Addendum)
Patient ID: Melanie Foster, female   DOB: 02-04-1955, 60 y.o.   MRN: [id number] [facility] MD Progress Note  09/20/2015 10:01 AM Chalsey Leeth  MRN:  [id number]   Subjective:   Patient reports ongoing depression, sadness, and also a vague sense of irritability. She attributes this mainly to concerns about her social situation- she states she does not feel she can go back to live with her son, because of his severe alcohol dependence , and his tendency to become violent when he drinks. States " and I can't go back to [place] either " ( patient had moved from [place] several months ago, to live with son). Denies medication side effects , but states it is only partially helpful .    Objective :  I have reviewed case with treatment team and have met with patient. Patient remains depressed, sad, and also , as per staff, often irritable, at times " firing " staff members, but also often apologetic , stating she realizes she is irritable and feels guilty about it . As above, her major concern relates to disposition, and her concerns about not having " anywhere to go" contribute to her anxiety, irritability. She denies medication side effects. She is visible on unit, going to groups . States she has been sad, but denies suicidal ideations and states " I care about my grandchildren, and I would not want them to have to deal with me hurting myself ". Although depressed, she is future oriented, thinking of going to a Rehab on discharge .   Principal Problem: Bipolar disorder, curr episode mixed, severe, w/o psychotic features ([place]) Diagnosis:   Patient Active Problem List   Diagnosis Date Noted  . Bipolar disorder, curr episode mixed, severe, w/o psychotic features ([place]) [F31.63] 09/16/2015  . Cocaine use disorder, severe, dependence ([place]) [F14.20] 09/16/2015  . Cannabis use disorder, mild, abuse [F12.10] 09/16/2015  . Polysubstance dependence ([place]) [F19.20] 09/15/2015  . Suicidal ideation [R45.851] 07/14/2015  .  Hypertension [I10] 07/14/2015  . Drug overdose [T50.901A] 07/14/2015  . Hypokalemia [E87.6] 07/14/2015  . Cocaine abuse [F14.10] 07/14/2015  . Marijuana abuse [F12.10] 07/14/2015  . Chronic back pain [M54.9, G89.29] 07/14/2015  . Suicide attempt ([place]) [T14.91]   . Herpes simplex [B00.9] 04/28/2015  . Tobacco abuse [Z72.0] 04/28/2015  . Chronic bronchitis ([place]) [J42] 04/28/2015  . DM neuropathy, type II diabetes mellitus ([place]) [E11.40] 04/28/2015  . Bipolar 1 disorder, mixed, severe ([place]) [F31.63] 04/22/2015  . Hyperlipidemia [E78.5] 04/22/2015  . Essential hypertension [I10] 04/22/2015  . Type 2 diabetes mellitus with hyperglycemia ([place]) [E11.65] 04/22/2015  . Metabolic acidosis [Y10.1] 04/22/2015  . Hypothyroidism [E03.9] 04/22/2015   Total Time spent with patient: 20 minutes    Past Medical History:  Past Medical History:  Diagnosis Date  . Anxiety   . Diabetes mellitus without complication ([place])   . Hypercholesteremia   . Hypertension   . Hypothyroid   . Nerve damage    both legs    Past Surgical History:  Procedure Laterality Date  . KNEE SURGERY Left unknown   surgical scar noted  . ROTATOR CUFF REPAIR Right unknown   per client, surgery scar noted  . SPLENECTOMY Right unknown   per client   Family History:  Family History  Problem Relation Age of Onset  . Hypertension Other   . Cancer Mother   . Alcoholism Cousin    Social History:  History  Alcohol Use No     History  Drug Use  . Types: Codeine,  Benzodiazepines, Marijuana    Social History   Social History  . Marital status: Single    Spouse name: N/A  . Number of children: N/A  . Years of education: N/A   Social History Main Topics  . Smoking status: Current Every Day Smoker    Packs/day: 0.50    Types: Cigarettes  . Smokeless tobacco: Never Used  . Alcohol use No  . Drug use:     Types: Codeine, Benzodiazepines, Marijuana  . Sexual activity: Not Currently   Other Topics Concern  .  None   Social History Narrative  . None   Additional Social History:    Pain Medications: See MAR Prescriptions: See MAR Over the Counter: See PTA History of alcohol / drug use?: Yes Longest period of sobriety (when/how long): 7 years Negative Consequences of Use: Financial Name of Substance 1: Crack 1 - Age of First Use: "in my 34s" 1 - Amount (size/oz): "as much money as I have" 1 - Duration: ongoing 1 - Last Use / Amount: 95/17 ukn amount  Sleep: Fair  Appetite:  Fair  Current Medications: Current Facility-Administered Medications  Medication Dose Route Frequency Provider Last Rate Last Dose  . aspirin EC tablet 81 mg  81 mg Oral Q1200 Lurena Nida, NP   81 mg at 09/19/15 1213  . divalproex (DEPAKOTE ER) 24 hr tablet 750 mg  750 mg Oral QHS Ambrose Finland, MD   750 mg at 09/19/15 2136  . hydrOXYzine (ATARAX/VISTARIL) tablet 25 mg  25 mg Oral Q6H PRN Ursula Alert, MD   25 mg at 09/20/15 0106  . insulin aspart (novoLOG) injection 0-5 Units  0-5 Units Subcutaneous QHS Saramma Eappen, MD      . insulin aspart (novoLOG) injection 0-9 Units  0-9 Units Subcutaneous TID WC Saramma Eappen, MD      . levothyroxine (SYNTHROID, LEVOTHROID) tablet 25 mcg  25 mcg Oral QAC breakfast Lurena Nida, NP   25 mcg at 09/20/15 4132  . LORazepam (ATIVAN) tablet 0.5 mg  0.5 mg Oral Q6H PRN Jenne Campus, MD   0.5 mg at 09/19/15 1524  . meloxicam (MOBIC) tablet 7.5 mg  7.5 mg Oral Daily Myer Peer Cobos, MD   7.5 mg at 09/20/15 0807  . metFORMIN (GLUCOPHAGE) tablet 500 mg  500 mg Oral Q breakfast Lurena Nida, NP   500 mg at 09/20/15 0806  . nicotine (NICODERM CQ - dosed in mg/24 hours) patch 21 mg  21 mg Transdermal Daily Ursula Alert, MD   21 mg at 09/20/15 0805  . pregabalin (LYRICA) capsule 100 mg  100 mg Oral TID Ambrose Finland, MD   100 mg at 09/19/15 2036  . QUEtiapine (SEROQUEL) tablet 400 mg  400 mg Oral QHS Lurena Nida, NP   400 mg at 09/19/15 2136  . verapamil  (CALAN) tablet 40 mg  40 mg Oral BID Lurena Nida, NP   40 mg at 09/20/15 4401    Lab Results:  Results for orders placed or performed during the hospital encounter of 09/15/15 (from the past 48 hour(s))  Glucose, capillary     Status: Abnormal   Collection Time: 09/18/15 12:07 PM  Result Value Ref Range   Glucose-Capillary 108 (H) 65 - 99 mg/dL  Glucose, capillary     Status: Abnormal   Collection Time: 09/18/15  8:40 PM  Result Value Ref Range   Glucose-Capillary 140 (H) 65 - 99 mg/dL  Glucose, capillary  Status: Abnormal   Collection Time: 09/19/15  6:20 AM  Result Value Ref Range   Glucose-Capillary 100 (H) 65 - 99 mg/dL  Glucose, capillary     Status: Abnormal   Collection Time: 09/19/15 11:35 AM  Result Value Ref Range   Glucose-Capillary 105 (H) 65 - 99 mg/dL  Glucose, capillary     Status: Abnormal   Collection Time: 09/19/15  5:13 PM  Result Value Ref Range   Glucose-Capillary 121 (H) 65 - 99 mg/dL  Glucose, capillary     Status: Abnormal   Collection Time: 09/19/15  8:30 PM  Result Value Ref Range   Glucose-Capillary 125 (H) 65 - 99 mg/dL  Glucose, capillary     Status: Abnormal   Collection Time: 09/20/15  5:54 AM  Result Value Ref Range   Glucose-Capillary 103 (H) 65 - 99 mg/dL   Comment 1 Notify RN    Comment 2 Document in Chart     Blood Alcohol level:  Lab Results  Component Value Date   ETH <5 09/14/2015   ETH <5 50/09/3816    Metabolic Disorder Labs: Lab Results  Component Value Date   HGBA1C 5.8 (H) 09/17/2015   MPG 120 09/17/2015   MPG 143 04/23/2015   Lab Results  Component Value Date   PROLACTIN 20.4 04/23/2015   Lab Results  Component Value Date   CHOL 162 09/17/2015   TRIG 108 09/17/2015   HDL 45 09/17/2015   CHOLHDL 3.6 09/17/2015   VLDL 22 09/17/2015   LDLCALC 95 09/17/2015   LDLCALC 133 (H) 04/23/2015    Physical Findings: AIMS: Facial and Oral Movements Muscles of Facial Expression: None, normal Lips and Perioral  Area: None, normal Jaw: None, normal Tongue: None, normal,Extremity Movements Upper (arms, wrists, hands, fingers): None, normal Lower (legs, knees, ankles, toes): None, normal, Trunk Movements Neck, shoulders, hips: None, normal, Overall Severity Severity of abnormal movements (highest score from questions above): None, normal Incapacitation due to abnormal movements: None, normal Patient's awareness of abnormal movements (rate only patient's report): No Awareness, Dental Status Current problems with teeth and/or dentures?: No Does patient usually wear dentures?: Yes  CIWA:  CIWA-Ar Total: 2 COWS:     Musculoskeletal: Strength & Muscle Tone: within normal limits Gait & Station: normal Patient leans: N/A  Psychiatric Specialty Exam: Physical Exam  ROS denies chest pain, no shortness of breath, no vomiting , describes chronic leg pain, discomfort, which she states she took Lyrica for .  Blood pressure (!) 145/73, pulse 82, temperature 97.7 F (36.5 C), temperature source Oral, resp. rate 16, height 5' 5"  (1.651 m), weight 175 lb (79.4 kg).Body mass index is 29.12 kg/m.  General Appearance: Fairly Groomed  Eye Contact:  fair  Speech:  Normal Rate  Volume:  Decreased  Mood:  Depressed  Affect:  Constricted, tearful   Thought Process:  Linear  Orientation:  Other:  fully alert and attentive   Thought Content:  denies hallucinations, no delusions   Suicidal Thoughts:  Denies any suicidal ideations at this time, and contracts for safety on the unit   Homicidal Thoughts:  No denies any homicidal or violent ideations   Memory:  recent and remote grossly intact   Judgement:  Fair  Insight:  Fair  Psychomotor Activity:  Normal  Concentration:  Concentration: Good and Attention Span: Good  Recall:  Good  Fund of Knowledge:  Good  Language:  Good  Akathisia:  Negative  Handed:  Right  AIMS (if indicated):  Assets:  Desire for Improvement Resilience  ADL's:  Intact  Cognition:   WNL  Sleep:  Number of Hours: 5.75   Assessment - patient presents depressed, sad, intermittently tearful. Also, as per staff report, is often irritable, angry, demanding, which patient has some insight about. States " it is just that I am in such a stressful situation. " Ruminates about her disposition issues , mainly about not wanting to return to son, who is described as a violent alcoholic, and being unable of returning to [place], [place], where she had been living in the past . Denies medication side effects  Treatment Plan Summary: Daily contact with patient to assess and evaluate symptoms and progress in treatment, Medication management, Plan inpatient treatment and medication management as below    Encourage group and milieu participation to work on coping skills and symptom reduction  Treatment team working on disposition planning, patient interested in going to a Rehab /residential setting on discharge Continue Seroquel 400 mgrs QHS  for mood disorder Continue Ativan 0.5 mgrs Q 6 hours PRN for anxiety or potential symptoms of WDL Continue Lyrica 100 mg PO TID for neuropathic pain  Continue Depakote ER 750 mgrs QHS for mood disorder  Continue Synthroid for management of hypothyroidism  Patient is wanting to consider going to a residential rehab setting - has history of cocaine abuse. Check Valproic Acid Serum level in AM . Neita Garnet, MD 09/20/2015, 10:01 AM

## 2015-09-20 NOTE — Progress Notes (Signed)
[facility] Group Notes:  (Nursing/MHT/Case Management/Adjunct)  Date:  09/20/2015  Time:  9:08 PM  Type of Therapy:  Psychoeducational Skills  Participation Level:  Active  Participation Quality:  Monopolizing  Affect:  Excited and Labile  Cognitive:  Disorganized  Insight:  Lacking  Engagement in Group:  Distracting, Monopolizing and Off Topic  Modes of Intervention:  Education  Summary of Progress/Problems: The patient was very loud and distracting in group this evening. The patient made multiple complaints about the staff and demanded that this author rebuke and change the attitudes of the day shift staff. She admits to having had a bad day in that it was both "real up and real down". She went on to explain that she had "manic" episodes and had been in a "hateful"mood at times. She stated on more than one occasion that she feels "under medicated".   Archie Balboa S 09/20/2015, 9:08 PM

## 2015-09-21 LAB — VALPROIC ACID LEVEL: Valproic Acid Lvl: 77 ug/mL (ref 50.0–100.0)

## 2015-09-21 LAB — GLUCOSE, CAPILLARY
Glucose-Capillary: 117 mg/dL — ABNORMAL HIGH (ref 65–99)
Glucose-Capillary: 100 mg/dL — ABNORMAL HIGH (ref 65–99)
Glucose-Capillary: 172 mg/dL — ABNORMAL HIGH (ref 65–99)
Glucose-Capillary: 160 mg/dL — ABNORMAL HIGH (ref 65–99)

## 2015-09-21 NOTE — Progress Notes (Signed)
DAR NOTE: Patient presents with irritable mood and affect.  Denies auditory and visual hallucinations.  Patient was angry and agitated due to difficulty finding a treatment program that would accept her.  Patient was apologetic later in the evening for her behavior.  Patient requested and received Vistaril 25 mg for complain of anxiety with good effect.  Rates depression at 7, hopelessness at 10, and anxiety at 10.  Maintained on routine safety checks.  Medications given as prescribed.  Support and encouragement offered as needed.  Attended group and participated.  States goal for today is "getting in a treatment program."  Patient observed socializing with peers in the dayroom.

## 2015-09-21 NOTE — [facility] Group Notes (Signed)
[facility] LCSW Group Therapy  09/21/2015 1:23 PM   Type of Therapy:  Group Therapy  Participation Level:  Active  Participation Quality:  Attentive  Affect:  Appropriate  Cognitive:  Appropriate  Insight:  Improving  Engagement in Therapy:  Engaged  Modes of Intervention:  Clarification, Education, Exploration and Socialization  Summary of Progress/Problems: Today's group focused on resilience prevention.  We defined the term, and then identified times of resilience. Mickaila stayed the entire time and was engaged throughout.  She used this AM as an example.  "When I was frustrated with being told no at [facility], I felt like throwing in the towel.  I threw a temper tantrum, had an emotional relapse and wanted to say just get me out of here.  But then I remembered my goal of bettering myself through sobriety, and apologized to those I needed to."  Talked about her great grandmother who helped raise her and was a wonderful role model of persistence and hard work.  Roque Lias B  09/21/2015 , 1:23 PM

## 2015-09-21 NOTE — Plan of Care (Signed)
Problem: Self-Concept: Goal: Level of anxiety will decrease Outcome: Progressing Pt was ver6y calm on the unit this evening and was not irritable towards peers or staff this evening

## 2015-09-21 NOTE — Progress Notes (Signed)
D: Pt at the time of assessment endorsed moderate anxiety and depression; states, "I was fine until I spoke to the SW; I spoke to a new one today who didn't know anything about me." Pt also complained of bilateral leg pain of 4. Pt however, denied SI, HI or AVH; Pt remained calm and cooperative. A: Medications offered as prescribed.  Support, encouragement, and safe environment provided.  15-minute safety checks continue. R: Pt was med compliant.  Pt attended wrap-up group. Safety checks continue.

## 2015-09-21 NOTE — Progress Notes (Signed)
D: Pt  SI- contracts for safety denies HI/AVH. Pt is pleasant and cooperative. Pt irritable earlier due to the fact that she said the visitors on the unit were causing a lot of noise. Pt stated she was not comfortable with visitors coming on the unit " why can't they visit in the cafeteria, why do they have to come on the unit, isn't that something with privacy" .   A: Pt was offered support and encouragement. Pt was given scheduled medications. Pt was encourage to attend groups. Q 15 minute checks were done for safety. Education about pt blood glucose and the importance of drinking water enforced.    R:Pt attends groups and interacts well with peers and staff. Pt is taking medication. Pt has no complaints.Pt receptive to treatment and safety maintained on unit. Pt has been appropriate on the unit this evening

## 2015-09-21 NOTE — Progress Notes (Signed)
Adult Psychoeducational Group Note  Date:  09/21/2015 Time:  8:31 PM  Group Topic/Focus:  Wrap-Up Group:   The focus of this group is to help patients review their daily goal of treatment and discuss progress on daily workbooks.   Participation Level:  Active  Participation Quality:  Appropriate  Affect:  Appropriate  Cognitive:  Appropriate  Insight: Appropriate  Engagement in Group:  Engaged  Modes of Intervention:  Discussion  Additional Comments: The patient expressed that she had a rough day.The patient also said that she attended all groups. Katarina, Nikolai 09/21/2015, 8:31 PM

## 2015-09-21 NOTE — Tx Team (Signed)
Interdisciplinary Treatment and Diagnostic Plan Update  09/21/2015 Time of Session: 9:30 AM Melanie Foster MRN: [id number]  Principal Diagnosis: Bipolar disorder, curr episode mixed, severe, w/o psychotic features ([place])  Secondary Diagnoses: Principal Problem:   Bipolar disorder, curr episode mixed, severe, w/o psychotic features ([place]) Active Problems:   Essential hypertension   Type 2 diabetes mellitus with hyperglycemia (HCC)   Hypothyroidism   DM neuropathy, type II diabetes mellitus ([place])   Cocaine use disorder, severe, dependence ([place])   Cannabis use disorder, mild, abuse   Current Medications:  Current Facility-Administered Medications  Medication Dose Route Frequency Provider Last Rate Last Dose  . acetaminophen (TYLENOL) tablet 650 mg  650 mg Oral Q6H PRN Kerrie Buffalo, NP   650 mg at 09/20/15 1839  . aspirin EC tablet 81 mg  81 mg Oral Q1200 Lurena Nida, NP   81 mg at 09/20/15 1529  . divalproex (DEPAKOTE ER) 24 hr tablet 750 mg  750 mg Oral QHS Ambrose Finland, MD   750 mg at 09/20/15 2123  . hydrOXYzine (ATARAX/VISTARIL) tablet 25 mg  25 mg Oral Q6H PRN Ursula Alert, MD   25 mg at 09/21/15 0228  . insulin aspart (novoLOG) injection 0-5 Units  0-5 Units Subcutaneous QHS Saramma Eappen, MD      . insulin aspart (novoLOG) injection 0-9 Units  0-9 Units Subcutaneous TID WC Saramma Eappen, MD      . levothyroxine (SYNTHROID, LEVOTHROID) tablet 25 mcg  25 mcg Oral QAC breakfast Lurena Nida, NP   25 mcg at 09/21/15 6808  . LORazepam (ATIVAN) tablet 0.5 mg  0.5 mg Oral Q6H PRN Jenne Campus, MD   0.5 mg at 09/20/15 1211  . meloxicam (MOBIC) tablet 7.5 mg  7.5 mg Oral Daily Myer Peer Cobos, MD   7.5 mg at 09/20/15 0807  . metFORMIN (GLUCOPHAGE) tablet 500 mg  500 mg Oral Q breakfast Lurena Nida, NP   500 mg at 09/20/15 0806  . nicotine (NICODERM CQ - dosed in mg/24 hours) patch 21 mg  21 mg Transdermal Daily Ursula Alert, MD   21 mg at 09/20/15 0805  . pregabalin  (LYRICA) capsule 100 mg  100 mg Oral TID Kerrie Buffalo, NP   100 mg at 09/20/15 2123  . QUEtiapine (SEROQUEL) tablet 400 mg  400 mg Oral QHS Lurena Nida, NP   400 mg at 09/20/15 2123  . verapamil (CALAN) tablet 40 mg  40 mg Oral BID Lurena Nida, NP   40 mg at 09/20/15 1703   PTA Medications: Prescriptions Prior to Admission  Medication Sig Dispense Refill Last Dose  . acetaminophen (TYLENOL) 325 MG tablet Take 2 tablets (650 mg total) by mouth every 6 (six) hours as needed for mild pain, moderate pain or headache.   Past Week at Unknown time  . aspirin EC 81 MG tablet Take 81 mg by mouth daily at 12 noon.   Past Week at Unknown time  . blood glucose meter kit and supplies KIT Dispense based on patient and insurance preference. Use up to four times daily as directed. (FOR ICD-9 250.00, 250.01). 1 each 0   . Blood Pressure Monitoring (BLOOD PRESSURE CUFF) MISC 1 application by Does not apply route daily after breakfast. 1 each 0   . Cyanocobalamin (B-12 PO) Take 1 tablet by mouth daily.   Past Week at Unknown time  . divalproex (DEPAKOTE) 250 MG DR tablet Take 1 tablet (250 mg total) by mouth every 8 (eight)  hours. 30 tablet 0 Past Week at Unknown time  . glucose blood (ONE TOUCH ULTRA TEST) test strip Use as instructed 100 each 12   . Lancets (ONETOUCH ULTRASOFT) lancets Use as instructed 100 each 12   . levothyroxine (SYNTHROID, LEVOTHROID) 25 MCG tablet Take 1 tablet (25 mcg total) by mouth daily before breakfast. 30 tablet 3 Past Week at Unknown time  . LORazepam (ATIVAN) 0.5 MG tablet Take 1 tablet (0.5 mg total) by mouth every 8 (eight) hours as needed for anxiety (withdrawal). 15 tablet 0 Past Week at Unknown time  . LYRICA 50 MG capsule TAKE 1 CAPSULE BY MOUTH THREE TIMES DAILY 90 capsule 0 Past Week at Unknown time  . metFORMIN (GLUCOPHAGE) 500 MG tablet Take 1 tablet (500 mg total) by mouth daily with breakfast. 60 tablet 1 Past Week at Unknown time  . nicotine (NICODERM CQ - DOSED IN  MG/24 HOURS) 21 mg/24hr patch Place 1 patch (21 mg total) onto the skin daily. (Patient not taking: Reported on 04/30/2015) 28 patch 0 Not Taking at Unknown time  . omeprazole (PRILOSEC) 20 MG capsule Take 1 capsule (20 mg total) by mouth daily. (Patient taking differently: Take 20 mg by mouth daily as needed (indigestion). ) 30 capsule 2 Past Week at Unknown time  . QUEtiapine (SEROQUEL) 400 MG tablet Take 1 tablet (400 mg total) by mouth at bedtime. 30 tablet 0 Past Week at Unknown time  . traMADol (ULTRAM) 50 MG tablet Take 2 tablets (100 mg total) by mouth 2 (two) times daily as needed for moderate pain. Reported on 07/14/2015 (Patient not taking: Reported on 09/14/2015) 20 tablet 0 Not Taking at Unknown time  . verapamil (CALAN) 40 MG tablet Take 1 tablet (40 mg total) by mouth 2 (two) times daily. Please tell her to stop the [facility] when she starts this again. 60 tablet 3 Past Week at Unknown time    Treatment Modalities: Medication Management, Group therapy, Case management,  1 to 1 session with clinician, Psychoeducation, Recreational therapy.   Physician Treatment Plan for Primary Diagnosis: Bipolar disorder, curr episode mixed, severe, w/o psychotic features ([place]) Long Term Goal(s): Improvement in symptoms so as ready for discharge   Short Term Goals: Ability to disclose and discuss suicidal ideas, Ability to identify and develop effective coping behaviors will improve and Compliance with prescribed medications will improve  Medication Management: Evaluate patient's response, side effects, and tolerance of medication regimen.  Therapeutic Interventions: 1 to 1 sessions, Unit Group sessions and Medication administration.  Evaluation of Outcomes: Met  Physician Treatment Plan for Secondary Diagnosis: Principal Problem:   Bipolar disorder, curr episode mixed, severe, w/o psychotic features ([place]) Active Problems:   Essential hypertension   Type 2 diabetes mellitus with hyperglycemia  (HCC)   Hypothyroidism   DM neuropathy, type II diabetes mellitus ([place])   Cocaine use disorder, severe, dependence ([place])   Cannabis use disorder, mild, abuse  Long Term Goal(s): Improvement in symptoms so as ready for discharge  Short Term Goals: Ability to identify changes in lifestyle to reduce recurrence of condition will improve, Ability to disclose and discuss suicidal ideas, Ability to identify and develop effective coping behaviors will improve and Ability to identify triggers associated with substance abuse/mental health issues will improve  Medication Management: Evaluate patient's response, side effects, and tolerance of medication regimen.  Therapeutic Interventions: 1 to 1 sessions, Unit Group sessions and Medication administration.  Evaluation of Outcomes: Progressing   RN Treatment Plan for Primary Diagnosis: Bipolar disorder,  curr episode mixed, severe, w/o psychotic features ([place]) Long Term Goal(s): Knowledge of disease and therapeutic regimen to maintain health will improve  Short Term Goals: Ability to remain free from injury will improve and Compliance with prescribed medications will improve  Medication Management: RN will administer medications as ordered by provider, will assess and evaluate patient's response and provide education to patient for prescribed medication. RN will report any adverse and/or side effects to prescribing provider.  Therapeutic Interventions: 1 on 1 counseling sessions, Psychoeducation, Medication administration, Evaluate responses to treatment, Monitor vital signs and CBGs as ordered, Perform/monitor CIWA, COWS, AIMS and Fall Risk screenings as ordered, Perform wound care treatments as ordered.  Evaluation of Outcomes: Met   LCSW Treatment Plan for Primary Diagnosis: Bipolar disorder, curr episode mixed, severe, w/o psychotic features ([place]) Long Term Goal(s): Safe transition to appropriate next level of care at discharge, Engage patient in  therapeutic group addressing interpersonal concerns.  Short Term Goals: Engage patient in aftercare planning with referrals and resources  Therapeutic Interventions: Assess for all discharge needs, 1 to 1 time with Social worker, Explore available resources and support systems, Assess for adequacy in community support network, Educate family and significant other(s) on suicide prevention, Complete Psychosocial Assessment, Interpersonal group therapy.  Evaluation of Outcomes: Not Met See below  Likely return home, follow up SAIOP   Progress in Treatment: Attending groups: Yes. Participating in groups: Yes. Taking medication as prescribed: Yes. Toleration medication: Yes. Family/Significant other contact made: Yes Patient understands diagnosis: Yes. Discussing patient identified problems/goals with staff: Yes. Medical problems stabilized or resolved: No. and As evidenced by:  pt asking for medications for neuropathy, also states she needs routine health screenings Denies suicidal/homicidal ideation: No. and As evidenced by:  admission for suicidal ideation, multiple community stressors, coping skills inadequate for community stressors Issues/concerns per patient self-inventory: No. Other: none  New problem(s) identified: Yes, Describe:  patient needs reading glasses, clothes, personal care items from family  New Short Term/Long Term Goal(s):  Discharge Plan or Barriers: asking for referral to rehab  Pt has been rejected at [place] so far.  Last resort is [facility]  Reason for Continuation of Hospitalization: Anxiety Depression Medication stabilization Suicidal ideation  Estimated Length of Stay: 2-4 days  Attendees: Patient: 09/21/2015 8:20 AM  Physician: Ursula Alert 09/21/2015 8:20 AM  Nursing: Phillis Haggis, RN 09/21/2015 8:20 AM  RN Care Manager: 09/21/2015 8:20 AM  Social Worker: Ripley Fraise 09/21/2015 8:20 AM  Recreational Therapist:  09/21/2015 8:20 AM   Other:  09/21/2015 8:20 AM  Other:  09/21/2015 8:20 AM  Other: 09/21/2015 8:20 AM    Scribe for Treatment Team: Trish Mage, LCSW 09/21/2015 8:20 AM

## 2015-09-21 NOTE — Progress Notes (Signed)
Recreation Therapy Notes  Date: 09/21/15 Time: 1000 Location:  500 Hall Group Room  Group Topic: Self-Esteem  Goal Area(s) Addresses:  Patient will identify the importance of recognizing your uniqueness. Patient will verbalize benefit of increased self-esteem.  Behavioral Response: Engaged  Intervention: Visual merchandiser, magazines, scissors, glue sticks, colored pencils  Activity: Advertising.  LRT went over the concept of advertising and the purpose of it.  Patients were to look through the magazines to find pictures or words that describe and highlight the uniqueness about them.  Patients could also use colored pencils to write words or draw pictures if they couldn't find any in the magazines.   Education:  Self-Esteem, Dentist.   Education Outcome: Acknowledges education/In group clarification offered/Needs additional education  Clinical Observations/Feedback: Pt stated the activity was somewhat hard because "not a lot of representation in the magazines".  Pt stated it also "took some thought to find out what to convey about myself".  Pt stated it is important to recognize her uniqueness because right now she is "caught up in the madness and all the things that make me a good person have slipped away but are coming back".    Pt also expressed that she feels like a ball of confusion and that makes her feel weak.   Victorino Sparrow, LRT/CTRS     Victorino Sparrow A 09/21/2015 11:31 AM

## 2015-09-22 DIAGNOSIS — Z79899 Other long term (current) drug therapy: Secondary | ICD-10-CM

## 2015-09-22 DIAGNOSIS — F3163 Bipolar disorder, current episode mixed, severe, without psychotic features: Principal | ICD-10-CM

## 2015-09-22 DIAGNOSIS — Z808 Family history of malignant neoplasm of other organs or systems: Secondary | ICD-10-CM

## 2015-09-22 DIAGNOSIS — Z8249 Family history of ischemic heart disease and other diseases of the circulatory system: Secondary | ICD-10-CM

## 2015-09-22 DIAGNOSIS — F17210 Nicotine dependence, cigarettes, uncomplicated: Secondary | ICD-10-CM

## 2015-09-22 DIAGNOSIS — Z811 Family history of alcohol abuse and dependence: Secondary | ICD-10-CM

## 2015-09-22 LAB — GLUCOSE, CAPILLARY
GLUCOSE-CAPILLARY: 90 mg/dL (ref 65–99)
GLUCOSE-CAPILLARY: 102 mg/dL — AB (ref 65–99)
Glucose-Capillary: 168 mg/dL — ABNORMAL HIGH (ref 65–99)
Glucose-Capillary: 154 mg/dL — ABNORMAL HIGH (ref 65–99)

## 2015-09-22 MED ORDER — QUETIAPINE FUMARATE 400 MG PO TABS
400.0000 mg | ORAL_TABLET | Freq: Every day | ORAL | Status: DC
  Administered 2015-09-22: 400 mg via ORAL
  Filled 2015-09-22 (×2): qty 1

## 2015-09-22 MED ORDER — QUETIAPINE FUMARATE ER 50 MG PO TB24
50.0000 mg | ORAL_TABLET | Freq: Every day | ORAL | Status: DC
  Administered 2015-09-22 – 2015-09-23 (×2): 50 mg via ORAL
  Filled 2015-09-22 (×4): qty 1

## 2015-09-22 MED ORDER — INFLUENZA VAC SPLIT QUAD 0.5 ML IM SUSY
0.5000 mL | PREFILLED_SYRINGE | INTRAMUSCULAR | Status: AC
  Administered 2015-09-23: 0.5 mL via INTRAMUSCULAR
  Filled 2015-09-22: qty 0.5

## 2015-09-22 MED ORDER — DIVALPROEX SODIUM ER 500 MG PO TB24
1000.0000 mg | ORAL_TABLET | Freq: Every day | ORAL | Status: DC
  Administered 2015-09-22: 1000 mg via ORAL
  Filled 2015-09-22 (×3): qty 2

## 2015-09-22 NOTE — Progress Notes (Signed)
Patient ID: Melanie Foster, female   DOB: 1955/06/04, 60 y.o.   MRN: [id number] [facility] MD Progress Note  09/22/2015 12:45 PM Melanie Foster  MRN:  [id number]   Subjective:   Asked to see this patient today by Dr Shea Evans because patient did not want to be seen by her. Spoke with patient who report long history of Substance use Disorder. Which included Cocaine .patient is complaing of anxiety and wanting to "explode"   Objective :  I have reviewed case with treatment team and have met with patient. Patient remains irritable and demanding and uncooperative. She demand xanax which she had been on prior to this hospitalization and had her last refill of 90 tablets on 08/03/15. She is also demanding tramadol              Principal Problem: Bipolar disorder, curr episode mixed, severe, w/o psychotic features ([place]) Diagnosis:   Patient Active Problem List   Diagnosis Date Noted  . Bipolar disorder, curr episode mixed, severe, w/o psychotic features ([place]) [F31.63] 09/16/2015  . Cocaine use disorder, severe, dependence ([place]) [F14.20] 09/16/2015  . Cannabis use disorder, mild, abuse [F12.10] 09/16/2015  . Polysubstance dependence ([place]) [F19.20] 09/15/2015  . Suicidal ideation [R45.851] 07/14/2015  . Hypertension [I10] 07/14/2015  . Drug overdose [T50.901A] 07/14/2015  . Hypokalemia [E87.6] 07/14/2015  . Cocaine abuse [F14.10] 07/14/2015  . Marijuana abuse [F12.10] 07/14/2015  . Chronic back pain [M54.9, G89.29] 07/14/2015  . Suicide attempt ([place]) [T14.91]   . Herpes simplex [B00.9] 04/28/2015  . Tobacco abuse [Z72.0] 04/28/2015  . Chronic bronchitis ([place]) [J42] 04/28/2015  . DM neuropathy, type II diabetes mellitus ([place]) [E11.40] 04/28/2015  . Bipolar 1 disorder, mixed, severe ([place]) [F31.63] 04/22/2015  . Hyperlipidemia [E78.5] 04/22/2015  . Essential hypertension [I10] 04/22/2015  . Type 2 diabetes mellitus with hyperglycemia ([place]) [E11.65] 04/22/2015  . Metabolic acidosis [I43.3] 04/22/2015  .  Hypothyroidism [E03.9] 04/22/2015   Total Time spent with patient: 20 minutes    Past Medical History:  Past Medical History:  Diagnosis Date  . Anxiety   . Diabetes mellitus without complication ([place])   . Hypercholesteremia   . Hypertension   . Hypothyroid   . Nerve damage    both legs    Past Surgical History:  Procedure Laterality Date  . KNEE SURGERY Left unknown   surgical scar noted  . ROTATOR CUFF REPAIR Right unknown   per client, surgery scar noted  . SPLENECTOMY Right unknown   per client   Family History:  Family History  Problem Relation Age of Onset  . Hypertension Other   . Cancer Mother   . Alcoholism Cousin    Social History:  History  Alcohol Use No     History  Drug Use  . Types: Codeine, Benzodiazepines, Marijuana    Social History   Social History  . Marital status: Single    Spouse name: N/A  . Number of children: N/A  . Years of education: N/A   Social History Main Topics  . Smoking status: Current Every Day Smoker    Packs/day: 0.50    Types: Cigarettes  . Smokeless tobacco: Never Used  . Alcohol use No  . Drug use:     Types: Codeine, Benzodiazepines, Marijuana  . Sexual activity: Not Currently   Other Topics Concern  . None   Social History Narrative  . None   Additional Social History:    Pain Medications: See MAR Prescriptions: See MAR Over the Counter: See PTA History  of alcohol / drug use?: Yes Longest period of sobriety (when/how long): 7 years Negative Consequences of Use: Financial Name of Substance 1: Crack 1 - Age of First Use: "in my 75s" 1 - Amount (size/oz): "as much money as I have" 1 - Duration: ongoing 1 - Last Use / Amount: 95/17 ukn amount  Sleep: Fair  Appetite:  Fair  Current Medications: Current Facility-Administered Medications  Medication Dose Route Frequency Provider Last Rate Last Dose  . acetaminophen (TYLENOL) tablet 650 mg  650 mg Oral Q6H PRN Kerrie Buffalo, NP   650 mg at  09/22/15 0814  . aspirin EC tablet 81 mg  81 mg Oral Q1200 Lurena Nida, NP   81 mg at 09/22/15 1210  . divalproex (DEPAKOTE ER) 24 hr tablet 1,000 mg  1,000 mg Oral QHS Sueanne Margarita, MD      . hydrOXYzine (ATARAX/VISTARIL) tablet 25 mg  25 mg Oral Q6H PRN Ursula Alert, MD   25 mg at 09/22/15 0818  . insulin aspart (novoLOG) injection 0-5 Units  0-5 Units Subcutaneous QHS Saramma Eappen, MD      . insulin aspart (novoLOG) injection 0-9 Units  0-9 Units Subcutaneous TID WC Ursula Alert, MD   Stopped at 09/22/15 1200  . levothyroxine (SYNTHROID, LEVOTHROID) tablet 25 mcg  25 mcg Oral QAC breakfast Lurena Nida, NP   25 mcg at 09/22/15 0814  . LORazepam (ATIVAN) tablet 0.5 mg  0.5 mg Oral Q6H PRN Jenne Campus, MD   0.5 mg at 09/20/15 1211  . meloxicam (MOBIC) tablet 7.5 mg  7.5 mg Oral Daily Myer Peer Cobos, MD   7.5 mg at 09/22/15 0815  . metFORMIN (GLUCOPHAGE) tablet 500 mg  500 mg Oral Q breakfast Lurena Nida, NP   500 mg at 09/22/15 0815  . nicotine (NICODERM CQ - dosed in mg/24 hours) patch 21 mg  21 mg Transdermal Daily Ursula Alert, MD   21 mg at 09/22/15 0815  . pregabalin (LYRICA) capsule 100 mg  100 mg Oral TID Kerrie Buffalo, NP   100 mg at 09/22/15 1040  . QUEtiapine (SEROQUEL XR) 24 hr tablet 50 mg  50 mg Oral Daily Sueanne Margarita, MD       And  . QUEtiapine (SEROQUEL) tablet 400 mg  400 mg Oral QHS Sueanne Margarita, MD      . verapamil (CALAN) tablet 40 mg  40 mg Oral BID Lurena Nida, NP   40 mg at 09/22/15 4034    Lab Results:  Results for orders placed or performed during the hospital encounter of 09/15/15 (from the past 48 hour(s))  Glucose, capillary     Status: Abnormal   Collection Time: 09/20/15  4:50 PM  Result Value Ref Range   Glucose-Capillary 129 (H) 65 - 99 mg/dL  Glucose, capillary     Status: Abnormal   Collection Time: 09/20/15  8:45 PM  Result Value Ref Range   Glucose-Capillary 190 (H) 65 - 99 mg/dL   Comment 1 Notify RN   Glucose,  capillary     Status: Abnormal   Collection Time: 09/21/15  5:40 AM  Result Value Ref Range   Glucose-Capillary 117 (H) 65 - 99 mg/dL  Valproic acid level     Status: None   Collection Time: 09/21/15  6:41 AM  Result Value Ref Range   Valproic Acid Lvl 77 50.0 - 100.0 ug/mL    Comment: Performed at [facility]  Glucose, capillary  Status: Abnormal   Collection Time: 09/21/15 12:04 PM  Result Value Ref Range   Glucose-Capillary 100 (H) 65 - 99 mg/dL  Glucose, capillary     Status: Abnormal   Collection Time: 09/21/15  5:03 PM  Result Value Ref Range   Glucose-Capillary 172 (H) 65 - 99 mg/dL  Glucose, capillary     Status: Abnormal   Collection Time: 09/21/15  8:39 PM  Result Value Ref Range   Glucose-Capillary 160 (H) 65 - 99 mg/dL  Glucose, capillary     Status: None   Collection Time: 09/22/15  6:08 AM  Result Value Ref Range   Glucose-Capillary 90 65 - 99 mg/dL  Glucose, capillary     Status: Abnormal   Collection Time: 09/22/15 12:04 PM  Result Value Ref Range   Glucose-Capillary 102 (H) 65 - 99 mg/dL    Blood Alcohol level:  Lab Results  Component Value Date   ETH <5 09/14/2015   ETH <5 26/37/8588    Metabolic Disorder Labs: Lab Results  Component Value Date   HGBA1C 5.8 (H) 09/17/2015   MPG 120 09/17/2015   MPG 143 04/23/2015   Lab Results  Component Value Date   PROLACTIN 20.4 04/23/2015   Lab Results  Component Value Date   CHOL 162 09/17/2015   TRIG 108 09/17/2015   HDL 45 09/17/2015   CHOLHDL 3.6 09/17/2015   VLDL 22 09/17/2015   LDLCALC 95 09/17/2015   LDLCALC 133 (H) 04/23/2015    Physical Findings: AIMS: Facial and Oral Movements Muscles of Facial Expression: None, normal Lips and Perioral Area: None, normal Jaw: None, normal Tongue: None, normal,Extremity Movements Upper (arms, wrists, hands, fingers): None, normal Lower (legs, knees, ankles, toes): None, normal, Trunk Movements Neck, shoulders, hips: None, normal,  Overall Severity Severity of abnormal movements (highest score from questions above): None, normal Incapacitation due to abnormal movements: None, normal Patient's awareness of abnormal movements (rate only patient's report): No Awareness, Dental Status Current problems with teeth and/or dentures?: No Does patient usually wear dentures?: Yes  CIWA:  CIWA-Ar Total: 2 COWS:     Musculoskeletal: Strength & Muscle Tone: within normal limits Gait & Station: normal Patient leans: N/A  Psychiatric Specialty Exam: Physical Exam  ROS denies chest pain, no shortness of breath, no vomiting , describes chronic leg pain, discomfort, which she states she took Lyrica for .  Blood pressure (!) 103/56, pulse 84, temperature 97.8 F (36.6 C), temperature source Oral, resp. rate 20, height 5' 5"  (1.651 m), weight 79.4 kg (175 lb).Body mass index is 29.12 kg/m.  General Appearance: Fairly Groomed  Eye Contact:  fair  Speech:  Normal Rate  Volume:  Decreased  Mood:  Labile  Affect:  Constricted, irritable   Thought Process:  Linear  Orientation:  Other:  fully alert and attentive   Thought Content:  denies hallucinations, no delusions   Suicidal Thoughts:  Denies any suicidal ideations at this time, and contracts for safety on the unit   Homicidal Thoughts:  No denies any homicidal or violent ideations   Memory:  recent and remote grossly intact   Judgement:  Fair  Insight:  Fair  Psychomotor Activity:  Normal  Concentration:  Concentration: Good and Attention Span: Good  Recall:  Good  Fund of Knowledge:  Good  Language:  Good  Akathisia:  Negative  Handed:  Right  AIMS (if indicated):     Assets:  Desire for Improvement Resilience  ADL's:  Intact  Cognition:  WNL  Sleep:  Number of Hours: 5.5   Assessment - patient presents with mood lability. Also, as per staff report, is often irritable, angry, demanding, which patient has some insight about. Controlled Substance Registration website  review done Treatment Plan Summary: Daily contact with patient to assess and evaluate symptoms and progress in treatment, Medication management, Plan inpatient treatment and medication management as below    Encourage group and milieu participation to work on coping skills and symptom reduction  Treatment team working on disposition planning, patient interested in going to a Rehab /residential setting on discharge Incresas depakote to 1073m ay bedtime Increase  Seroquel to 535mdaily and 400 mgrs QHS  for mood disorder Continue Ativan 0.5 mgrs Q 6 hours PRN for anxiety or potential symptoms of WDL Continue Lyrica 100 mg PO TID for neuropathic pain  Increse Depakote ER to 100049mgrs QHS for mood disorder --Last level was 77 Continue Synthroid for management of hypothyroidism  Patient is wanting to consider going to a residential rehab setting - has history of cocaine abuse.  UreRuffin FrederickD 09/22/2015, 12:45 PM

## 2015-09-22 NOTE — Progress Notes (Signed)
Recreation Therapy Notes  Date: 09/22/15 Time: 1000 Location: 500 Hall Dayroom  Group Topic: Coping Skills  Goal Area(s) Addresses:  Patient will be able to identify positive coping skills. Patient will be able to identify how using positive coping skills will help them post d/c.  Behavioral Response: Engaged  Intervention: Worksheet, colored pencils  Activity: Building surveyor.  Patients were given a worksheet with a Building surveyor.  Patients were to identify the situations they are facing that have them "stuck" and write them within the lines of the web.  Patients were then asked to come up with coping skills for each of the situations they are facing.  Education: Radiographer, therapeutic, Dentist.   Education Outcome: Acknowledges understanding/In group clarification offered/Needs additional education.   Clinical Observations/Feedback: Pt stated she was dealing with suicide, relapse, grief and anger.  The coping skills pt identified are "love myself, go to outpatient counseling, medication, therapy and spirituality".  Pt expressed these coping skills will allow her to have "very happy golden years".   Victorino Sparrow, LRT/CTRS      Victorino Sparrow A 09/22/2015 12:19 PM

## 2015-09-22 NOTE — Progress Notes (Signed)
Adult Psychoeducational Group Note  Date:  09/22/2015 Time:  9:01 PM  Group Topic/Focus:  Wrap-Up Group:   The focus of this group is to help patients review their daily goal of treatment and discuss progress on daily workbooks.   Participation Level:  Active  Participation Quality:  Appropriate  Affect:  Appropriate  Cognitive:  Alert  Insight: Appropriate  Engagement in Group:  Engaged  Modes of Intervention:  Discussion  Additional Comments: Patient states her day has been up and down. Patient goal for today, was to try and find a facility to go to after discharge.  Melanie Foster L Melanie Foster 09/22/2015, 9:01 PM

## 2015-09-22 NOTE — [facility] Group Notes (Signed)
[place] LCSW Group Therapy  09/22/2015 2:24 PM   Type of Therapy:  Group Therapy   Participation Level:  Engaged  Participation Quality:  Attentive  Affect:  Appropriate   Cognitive:  Alert   Insight:  Engaged  Engagement in Therapy:  Improving   Modes of Intervention:  Education, Exploration, Socialization   Summary of Progress/Problems: Melanie Foster was engaged throughout , stayed entire time.   Shanon Brow from the [place] [facility] was here to tell his story of recovery, inform patients about [facility] and play his guitar.   [name] 09/22/2015 2:24 PM

## 2015-09-22 NOTE — Plan of Care (Signed)
Problem: Medication: Goal: Compliance with prescribed medication regimen will improve Outcome: Progressing Patient is med compliant.  Problem: Safety: Goal: Ability to remain free from injury will improve Outcome: Progressing Patient has not engaged in self harm. Denies SI.

## 2015-09-22 NOTE — Progress Notes (Signed)
D: Patient up and visible in the milieu. Spoke with patient 1:1. Rates sleep poor, appetite good, energy low and concentration poor. Patient's affect flat, mood labile and irritable. Rating her depression at a 7/10, hopelessness at a 7/10 and anxiety at a 10/10. Denies AVH. States goal for today is to "get into a rehab program and get meeting times and intensive aftercare in place." Complaining of generalized pain of anywhere from 7-9/10.  A: Medicated per orders, vistaril and tylenol given prn. Emotional support offered and self inventory reviewed. Fall precautions in place and reviewed.   R: On reassess, patient verbalizes understanding. Pain decreased to a 7/10 at best. Patient denies SI/HI and remains safe on level III obs.

## 2015-09-22 NOTE — Progress Notes (Signed)
09/22/15 0935 72 hr RFD signed and on shadow chart.

## 2015-09-22 NOTE — Plan of Care (Signed)
Problem: Coping: Goal: Ability to cope will improve Outcome: Progressing Pt denies SI at this time  Problem: Coping: Goal: Ability to identify and develop effective coping behavior will improve Outcome: Not Progressing Pt labile on unit arguring with peers/ staff, pt needs to be re-directd

## 2015-09-22 NOTE — Progress Notes (Signed)
D: Pt denies SI/HI/AVH. Pt is labile and irritable. Pt complains about everything. Pt stated she was ready to go if she could not get into a Tx program. Pt wants to D/C Friday and possibly be in at least IOP Monday.   A: Pt was offered support and encouragement. Pt was given scheduled medications. Pt was encourage to attend groups. Q 15 minute checks were done for safety.   R:Pt attends groups\Pt is taking medication. Pt receptive to treatment and safety maintained on unit.

## 2015-09-23 LAB — GLUCOSE, CAPILLARY: GLUCOSE-CAPILLARY: 113 mg/dL — AB (ref 65–99)

## 2015-09-23 MED ORDER — BLOOD PRESSURE CUFF MISC: 1.0000 "application " | Freq: Every day | 0 refills | Status: DC

## 2015-09-23 MED ORDER — OMEPRAZOLE 20 MG PO CPDR: 20.0000 mg | DELAYED_RELEASE_CAPSULE | Freq: Every day | ORAL | 2 refills | Status: DC

## 2015-09-23 MED ORDER — GLUCOSE BLOOD VI STRP: ORAL_STRIP | 12 refills | Status: DC

## 2015-09-23 MED ORDER — METFORMIN HCL 500 MG PO TABS: 500.0000 mg | ORAL_TABLET | Freq: Every day | ORAL | 0 refills | Status: DC

## 2015-09-23 MED ORDER — QUETIAPINE FUMARATE 400 MG PO TABS: 400.0000 mg | ORAL_TABLET | Freq: Every day | ORAL | 0 refills | Status: DC

## 2015-09-23 MED ORDER — VERAPAMIL HCL 40 MG PO TABS: 40.0000 mg | ORAL_TABLET | Freq: Two times a day (BID) | ORAL | 3 refills | Status: DC

## 2015-09-23 MED ORDER — ACETAMINOPHEN 325 MG PO TABS: 650.0000 mg | ORAL_TABLET | Freq: Four times a day (QID) | ORAL | Status: DC | PRN

## 2015-09-23 MED ORDER — QUETIAPINE FUMARATE ER 50 MG PO TB24: 50.0000 mg | ORAL_TABLET | Freq: Every day | ORAL | 0 refills | Status: DC

## 2015-09-23 MED ORDER — DIVALPROEX SODIUM ER 500 MG PO TB24: 1000.0000 mg | ORAL_TABLET | Freq: Every day | ORAL | 0 refills | Status: DC

## 2015-09-23 MED ORDER — MELOXICAM 7.5 MG PO TABS: 7.5000 mg | ORAL_TABLET | Freq: Every day | ORAL | 0 refills | Status: DC

## 2015-09-23 MED ORDER — PREGABALIN 50 MG PO CAPS: 50.0000 mg | ORAL_CAPSULE | Freq: Three times a day (TID) | ORAL | 0 refills | Status: DC

## 2015-09-23 MED ORDER — ASPIRIN EC 81 MG PO TBEC: 81.0000 mg | DELAYED_RELEASE_TABLET | Freq: Every day | ORAL | Status: DC

## 2015-09-23 MED ORDER — NICOTINE 21 MG/24HR TD PT24: 21.0000 mg | MEDICATED_PATCH | Freq: Every day | TRANSDERMAL | 0 refills | Status: DC

## 2015-09-23 MED ORDER — BLOOD GLUCOSE MONITOR KIT: PACK | 0 refills | Status: DC

## 2015-09-23 MED ORDER — ONETOUCH ULTRASOFT LANCETS MISC: 12 refills | Status: DC

## 2015-09-23 MED ORDER — HYDROXYZINE HCL 25 MG PO TABS: 25.0000 mg | ORAL_TABLET | Freq: Four times a day (QID) | ORAL | 0 refills | Status: DC | PRN

## 2015-09-23 MED ORDER — LORAZEPAM 0.5 MG PO TABS
0.5000 mg | ORAL_TABLET | Freq: Three times a day (TID) | ORAL | 0 refills | Status: DC | PRN
Start: 2015-09-23 — End: 2016-03-23

## 2015-09-23 MED ORDER — LEVOTHYROXINE SODIUM 25 MCG PO TABS: 25.0000 ug | ORAL_TABLET | Freq: Every day | ORAL | 3 refills | Status: AC

## 2015-09-23 NOTE — Tx Team (Signed)
Interdisciplinary Treatment and Diagnostic Plan Update  09/23/2015 Time of Session: 9:30 AM Melanie Foster MRN: [id number]  Principal Diagnosis: Bipolar disorder, curr episode mixed, severe, w/o psychotic features ([place])  Secondary Diagnoses: Principal Problem:   Bipolar disorder, curr episode mixed, severe, w/o psychotic features ([place]) Active Problems:   Essential hypertension   Type 2 diabetes mellitus with hyperglycemia (HCC)   Hypothyroidism   DM neuropathy, type II diabetes mellitus ([place])   Cocaine use disorder, severe, dependence ([place])   Cannabis use disorder, mild, abuse   Current Medications:  Current Facility-Administered Medications  Medication Dose Route Frequency Provider Last Rate Last Dose  . acetaminophen (TYLENOL) tablet 650 mg  650 mg Oral Q6H PRN Kerrie Buffalo, NP   650 mg at 09/22/15 1558  . aspirin EC tablet 81 mg  81 mg Oral Q1200 Lurena Nida, NP   81 mg at 09/22/15 1210  . divalproex (DEPAKOTE ER) 24 hr tablet 1,000 mg  1,000 mg Oral QHS Sueanne Margarita, MD   1,000 mg at 09/22/15 2045  . hydrOXYzine (ATARAX/VISTARIL) tablet 25 mg  25 mg Oral Q6H PRN Ursula Alert, MD   25 mg at 09/22/15 0818  . insulin aspart (novoLOG) injection 0-5 Units  0-5 Units Subcutaneous QHS Saramma Eappen, MD      . insulin aspart (novoLOG) injection 0-9 Units  0-9 Units Subcutaneous TID WC Ursula Alert, MD   Stopped at 09/22/15 1200  . levothyroxine (SYNTHROID, LEVOTHROID) tablet 25 mcg  25 mcg Oral QAC breakfast Lurena Nida, NP   25 mcg at 09/23/15 0920  . LORazepam (ATIVAN) tablet 0.5 mg  0.5 mg Oral Q6H PRN Jenne Campus, MD   0.5 mg at 09/22/15 1558  . meloxicam (MOBIC) tablet 7.5 mg  7.5 mg Oral Daily Myer Peer Cobos, MD   7.5 mg at 09/23/15 0920  . metFORMIN (GLUCOPHAGE) tablet 500 mg  500 mg Oral Q breakfast Lurena Nida, NP   500 mg at 09/23/15 4098  . nicotine (NICODERM CQ - dosed in mg/24 hours) patch 21 mg  21 mg Transdermal Daily Ursula Alert, MD   21 mg at 09/23/15  0924  . pregabalin (LYRICA) capsule 100 mg  100 mg Oral TID Kerrie Buffalo, NP   100 mg at 09/23/15 0920  . QUEtiapine (SEROQUEL XR) 24 hr tablet 50 mg  50 mg Oral Daily Sueanne Margarita, MD   50 mg at 09/23/15 0920   And  . QUEtiapine (SEROQUEL) tablet 400 mg  400 mg Oral QHS Sueanne Margarita, MD   400 mg at 09/22/15 2045  . verapamil (CALAN) tablet 40 mg  40 mg Oral BID Lurena Nida, NP   40 mg at 09/23/15 0920   PTA Medications: Prescriptions Prior to Admission  Medication Sig Dispense Refill Last Dose  . acetaminophen (TYLENOL) 325 MG tablet Take 2 tablets (650 mg total) by mouth every 6 (six) hours as needed for mild pain, moderate pain or headache.   Past Week at Unknown time  . aspirin EC 81 MG tablet Take 81 mg by mouth daily at 12 noon.   Past Week at Unknown time  . blood glucose meter kit and supplies KIT Dispense based on patient and insurance preference. Use up to four times daily as directed. (FOR ICD-9 250.00, 250.01). 1 each 0   . Blood Pressure Monitoring (BLOOD PRESSURE CUFF) MISC 1 application by Does not apply route daily after breakfast. 1 each 0   . Cyanocobalamin (B-12 PO)  Take 1 tablet by mouth daily.   Past Week at Unknown time  . divalproex (DEPAKOTE) 250 MG DR tablet Take 1 tablet (250 mg total) by mouth every 8 (eight) hours. 30 tablet 0 Past Week at Unknown time  . glucose blood (ONE TOUCH ULTRA TEST) test strip Use as instructed 100 each 12   . Lancets (ONETOUCH ULTRASOFT) lancets Use as instructed 100 each 12   . levothyroxine (SYNTHROID, LEVOTHROID) 25 MCG tablet Take 1 tablet (25 mcg total) by mouth daily before breakfast. 30 tablet 3 Past Week at Unknown time  . LORazepam (ATIVAN) 0.5 MG tablet Take 1 tablet (0.5 mg total) by mouth every 8 (eight) hours as needed for anxiety (withdrawal). 15 tablet 0 Past Week at Unknown time  . LYRICA 50 MG capsule TAKE 1 CAPSULE BY MOUTH THREE TIMES DAILY 90 capsule 0 Past Week at Unknown time  . metFORMIN (GLUCOPHAGE) 500 MG  tablet Take 1 tablet (500 mg total) by mouth daily with breakfast. 60 tablet 1 Past Week at Unknown time  . nicotine (NICODERM CQ - DOSED IN MG/24 HOURS) 21 mg/24hr patch Place 1 patch (21 mg total) onto the skin daily. (Patient not taking: Reported on 04/30/2015) 28 patch 0 Not Taking at Unknown time  . omeprazole (PRILOSEC) 20 MG capsule Take 1 capsule (20 mg total) by mouth daily. (Patient taking differently: Take 20 mg by mouth daily as needed (indigestion). ) 30 capsule 2 Past Week at Unknown time  . QUEtiapine (SEROQUEL) 400 MG tablet Take 1 tablet (400 mg total) by mouth at bedtime. 30 tablet 0 Past Week at Unknown time  . traMADol (ULTRAM) 50 MG tablet Take 2 tablets (100 mg total) by mouth 2 (two) times daily as needed for moderate pain. Reported on 07/14/2015 (Patient not taking: Reported on 09/14/2015) 20 tablet 0 Not Taking at Unknown time  . verapamil (CALAN) 40 MG tablet Take 1 tablet (40 mg total) by mouth 2 (two) times daily. Please tell her to stop the [facility] when she starts this again. 60 tablet 3 Past Week at Unknown time    Treatment Modalities: Medication Management, Group therapy, Case management,  1 to 1 session with clinician, Psychoeducation, Recreational therapy.   Physician Treatment Plan for Primary Diagnosis: Bipolar disorder, curr episode mixed, severe, w/o psychotic features ([place]) Long Term Goal(s): Improvement in symptoms so as ready for discharge   Short Term Goals: Ability to disclose and discuss suicidal ideas, Ability to identify and develop effective coping behaviors will improve and Compliance with prescribed medications will improve  Medication Management: Evaluate patient's response, side effects, and tolerance of medication regimen.  Therapeutic Interventions: 1 to 1 sessions, Unit Group sessions and Medication administration.  Evaluation of Outcomes: Met  Physician Treatment Plan for Secondary Diagnosis: Principal Problem:   Bipolar disorder, curr episode  mixed, severe, w/o psychotic features ([place]) Active Problems:   Essential hypertension   Type 2 diabetes mellitus with hyperglycemia (HCC)   Hypothyroidism   DM neuropathy, type II diabetes mellitus ([place])   Cocaine use disorder, severe, dependence ([place])   Cannabis use disorder, mild, abuse  Long Term Goal(s): Improvement in symptoms so as ready for discharge  Short Term Goals: Ability to identify changes in lifestyle to reduce recurrence of condition will improve, Ability to disclose and discuss suicidal ideas, Ability to identify and develop effective coping behaviors will improve and Ability to identify triggers associated with substance abuse/mental health issues will improve  Medication Management: Evaluate patient's response, side effects, and  tolerance of medication regimen.  Therapeutic Interventions: 1 to 1 sessions, Unit Group sessions and Medication administration.  Evaluation of Outcomes: Adequate for discharge   RN Treatment Plan for Primary Diagnosis: Bipolar disorder, curr episode mixed, severe, w/o psychotic features ([place]) Long Term Goal(s): Knowledge of disease and therapeutic regimen to maintain health will improve  Short Term Goals: Ability to remain free from injury will improve and Compliance with prescribed medications will improve  Medication Management: RN will administer medications as ordered by provider, will assess and evaluate patient's response and provide education to patient for prescribed medication. RN will report any adverse and/or side effects to prescribing provider.  Therapeutic Interventions: 1 on 1 counseling sessions, Psychoeducation, Medication administration, Evaluate responses to treatment, Monitor vital signs and CBGs as ordered, Perform/monitor CIWA, COWS, AIMS and Fall Risk screenings as ordered, Perform wound care treatments as ordered.  Evaluation of Outcomes: Met   LCSW Treatment Plan for Primary Diagnosis: Bipolar disorder, curr episode  mixed, severe, w/o psychotic features ([place]) Long Term Goal(s): Safe transition to appropriate next level of care at discharge, Engage patient in therapeutic group addressing interpersonal concerns.  Short Term Goals: Engage patient in aftercare planning with referrals and resources  Therapeutic Interventions: Assess for all discharge needs, 1 to 1 time with Social worker, Explore available resources and support systems, Assess for adequacy in community support network, Educate family and significant other(s) on suicide prevention, Complete Psychosocial Assessment, Interpersonal group therapy.  Evaluation of Outcomes: Met  See below  Likely return home, follow up [facility]   Progress in Treatment: Attending groups: Yes. Participating in groups: Yes. Taking medication as prescribed: Yes. Toleration medication: Yes. Family/Significant other contact made: Yes Patient understands diagnosis: Yes. Discussing patient identified problems/goals with staff: Yes. Medical problems stabilized or resolved: No. and As evidenced by:  pt asking for medications for neuropathy, also states she needs routine health screenings Denies suicidal/homicidal ideation: No. and As evidenced by:  admission for suicidal ideation, multiple community stressors, coping skills inadequate for community stressors Issues/concerns per patient self-inventory: No. Other: none  New problem(s) identified: Yes, Describe:  patient needs reading glasses, clothes, personal care items from family  New Short Term/Long Term Goal(s):  Discharge Plan or Barriers: asking for referral to rehab  Pt has been rejected at [facility] so far.  Last resort is [facility] 9/14;  Pt will return home, follow up outpt  Reason for Continuation of Hospitalization:   Estimated Length of Stay: D/C today  Attendees: Patient: 09/23/2015 10:41 AM  Physician: Ursula Alert 09/23/2015 10:41 AM  Nursing: Phillis Haggis, RN 09/23/2015 10:41 AM   RN Care Manager: 09/23/2015 10:41 AM  Social Worker: Ripley Fraise 09/23/2015 10:41 AM  Recreational Therapist:  09/23/2015 10:41 AM  Other:  09/23/2015 10:41 AM  Other:  09/23/2015 10:41 AM  Other: 09/23/2015 10:41 AM    Scribe for Treatment Team: Trish Mage, LCSW 09/23/2015 10:41 AM
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
## 2015-09-23 NOTE — BHH Suicide Risk Assessment (Signed)
[facility] Discharge Suicide Risk Assessment   Principal Problem: Bipolar disorder, curr episode mixed, severe, w/o psychotic features [facility]) Discharge Diagnoses:  Patient Active Problem List   Diagnosis Date Noted  . Bipolar disorder, curr episode mixed, severe, w/o psychotic features ([place]) [F31.63] 09/16/2015  . Cocaine use disorder, severe, dependence ([place]) [F14.20] 09/16/2015  . Cannabis use disorder, mild, abuse [F12.10] 09/16/2015  . Polysubstance dependence ([place]) [F19.20] 09/15/2015  . Suicidal ideation [R45.851] 07/14/2015  . Hypertension [I10] 07/14/2015  . Drug overdose [T50.901A] 07/14/2015  . Hypokalemia [E87.6] 07/14/2015  . Cocaine abuse [F14.10] 07/14/2015  . Marijuana abuse [F12.10] 07/14/2015  . Chronic back pain [M54.9, G89.29] 07/14/2015  . Suicide attempt ([place]) [T14.91]   . Herpes simplex [B00.9] 04/28/2015  . Tobacco abuse [Z72.0] 04/28/2015  . Chronic bronchitis ([place]) [J42] 04/28/2015  . DM neuropathy, type II diabetes mellitus ([place]) [E11.40] 04/28/2015  . Bipolar 1 disorder, mixed, severe ([place]) [F31.63] 04/22/2015  . Hyperlipidemia [E78.5] 04/22/2015  . Essential hypertension [I10] 04/22/2015  . Type 2 diabetes mellitus with hyperglycemia ([place]) [E11.65] 04/22/2015  . Metabolic acidosis 99991111 04/22/2015  . Hypothyroidism [E03.9] 04/22/2015    Total Time spent with patient: 30 minutes  Musculoskeletal: Strength & Muscle Tone: within normal limits Gait & Station: normal Patient leans: N/A  Psychiatric Specialty Exam: ROS  Blood pressure 127/76, pulse 79, temperature 97.8 F (36.6 C), temperature source Oral, resp. rate 18, height 5\' 5"  (1.651 m), weight 79.4 kg (175 lb).Body mass index is 29.12 kg/m.  General Appearance: Casual  Eye Contact::  Good  Speech:  Clear and Coherent and Normal Rate409  Volume:  Normal  Mood:  Euthymic  Affect:  Appropriate and Congruent  Thought Process:  Coherent and Goal Directed  Orientation:  Full (Time, Place, and  Person)  Thought Content:  Logical  Suicidal Thoughts:  No  Homicidal Thoughts:  No  Memory:  Immediate;   Fair Recent;   Fair Remote;   Fair  Judgement:  Fair  Insight: Limited to  Fair  Psychomotor Activity:  Normal  Concentration:  Fair  Recall:  [employer] of Knowledge:Fair  Language: Fair  Akathisia:  No  Handed:  Right  AIMS (if indicated):     Assets:  Communication Skills Desire for Improvement Financial Resources/Insurance Housing Social Support Transportation  Sleep:  Number of Hours: 5.5  Cognition: WNL  ADL's:  Intact   Mental Status Per Nursing Assessment::   On Admission:  Suicidal ideation indicated by patient  Demographic Factors:  None  Loss Factors: NA  Historical Factors: Prior suicide attempts, Family history of mental illness or substance abuse and Impulsivity  Risk Reduction Factors:   Sense of responsibility to family, Religious beliefs about death, Living with another person, especially a relative and Positive social support  Continued Clinical Symptoms:  nONE  Cognitive Features That Contribute To Risk:  None    Suicide Risk:  Minimal: No identifiable suicidal ideation.  Patients presenting with no risk factors but with morbid ruminations; may be classified as minimal risk based on the severity of the depressive symptoms  Follow-up Information    [facility]. Go in 1 day(s).   Why:  Appoinment on Friday, September 24, 2015 at 2:00 pm.  Contact information: Telephone: [phone]  Fax: [place], [place] [address]           Plan Of Care/Follow-up recommendations:  Activity:  Patient says she is looking forward to following up with her aftercare   Chinita Pester  Sigurd Sos, MD 09/23/2015, 10:14 AM

## 2015-09-23 NOTE — Progress Notes (Signed)
Patient discharged to lobby. Patient was stable and appreciative at that time. All papers and prescriptions were given and valuables returned. Verbal understanding expressed. Denies SI/HI and A/VH. Patient given opportunity to express concerns and ask questions.  

## 2015-09-23 NOTE — Progress Notes (Signed)
  [facility] Adult Case Management Discharge Plan :  Will you be returning to the same living situation after discharge:  Yes,  home At discharge, do you have transportation home?: Yes,  bus pass or cab Do you have the ability to pay for your medications: Yes,  MCD  Release of information consent forms completed and in the chart;  Patient's signature needed at discharge.  Patient to Follow up at: Follow-up Information    [facility]. Go in 1 day(s).   Why:  Appoinment on Friday, September 24, 2015 at 2:00 pm.  Contact information: Telephone: [phone]  Fax: [place], [place] [address]           Next level of care provider has access to [place] and Suicide Prevention discussed: Yes,  yes  Have you used any form of tobacco in the last 30 days? (Cigarettes, Smokeless Tobacco, Cigars, and/or Pipes): Yes  Has patient been referred to the Quitline?: Patient refused referral  Patient has been referred for addiction treatment: Yes  Trish Mage 09/23/2015, 10:46 AM

## 2015-09-23 NOTE — Progress Notes (Signed)
Recreation Therapy Notes  Date: 09/23/15 Time: 1000 Location: 500 Hall Dayroom  Group Topic: Communication, Team Building, Problem Solving  Goal Area(s) Addresses:  Patient will effectively work with peer towards shared goal.  Patient will identify skill used to make activity successful.  Patient will identify how skills used during activity can be used to reach post d/c goals.   Intervention: STEM Activity   Activity: [employer]. Patients were provided the following materials: 5 drinking straws, 5 rubber bands, 5 paper clips, 2 index cards, 2 drinking cups, and 2 toilet paper rolls. Using the provided materials patients were asked to build a launching mechanisms to launch a ping pong ball approximately 12 feet. Patients were divided into teams of 3-5.   Education: Education officer, community, Dentist.   Education Outcome: Needs additional education.   Clinical Observations/Feedback: Pt did not attend group.   Victorino Sparrow, LRT/CTRS         Victorino Sparrow A 09/23/2015 12:24 PM

## 2015-10-23 ENCOUNTER — Emergency Department (HOSPITAL_COMMUNITY)
Admission: EM | Admit: 2015-10-23 | Discharge: 2015-10-25 | Disposition: A | Discharge status: Other Acute Inpatient Hospital | Payer: Medicare Other | Attending: Emergency Medicine | Admitting: Emergency Medicine

## 2015-10-23 ENCOUNTER — Encounter (HOSPITAL_COMMUNITY): Payer: Self-pay | Admitting: Emergency Medicine

## 2015-10-23 ENCOUNTER — Emergency Department (HOSPITAL_COMMUNITY): Payer: Medicare Other

## 2015-10-23 DIAGNOSIS — R45851 Suicidal ideations: Secondary | ICD-10-CM | POA: Diagnosis present

## 2015-10-23 DIAGNOSIS — F17210 Nicotine dependence, cigarettes, uncomplicated: Secondary | ICD-10-CM | POA: Diagnosis not present

## 2015-10-23 DIAGNOSIS — Y999 Unspecified external cause status: Secondary | ICD-10-CM | POA: Diagnosis not present

## 2015-10-23 DIAGNOSIS — Z7982 Long term (current) use of aspirin: Secondary | ICD-10-CM | POA: Diagnosis not present

## 2015-10-23 DIAGNOSIS — Y929 Unspecified place or not applicable: Secondary | ICD-10-CM | POA: Diagnosis not present

## 2015-10-23 DIAGNOSIS — E039 Hypothyroidism, unspecified: Secondary | ICD-10-CM | POA: Diagnosis not present

## 2015-10-23 DIAGNOSIS — R440 Auditory hallucinations: Secondary | ICD-10-CM | POA: Diagnosis not present

## 2015-10-23 DIAGNOSIS — F1410 Cocaine abuse, uncomplicated: Secondary | ICD-10-CM | POA: Diagnosis not present

## 2015-10-23 DIAGNOSIS — S90129A Contusion of unspecified lesser toe(s) without damage to nail, initial encounter: Secondary | ICD-10-CM

## 2015-10-23 DIAGNOSIS — E119 Type 2 diabetes mellitus without complications: Secondary | ICD-10-CM | POA: Diagnosis not present

## 2015-10-23 DIAGNOSIS — I10 Essential (primary) hypertension: Secondary | ICD-10-CM | POA: Insufficient documentation

## 2015-10-23 DIAGNOSIS — F1910 Other psychoactive substance abuse, uncomplicated: Secondary | ICD-10-CM

## 2015-10-23 DIAGNOSIS — Z811 Family history of alcohol abuse and dependence: Secondary | ICD-10-CM | POA: Diagnosis not present

## 2015-10-23 DIAGNOSIS — W228XXA Striking against or struck by other objects, initial encounter: Secondary | ICD-10-CM | POA: Diagnosis not present

## 2015-10-23 DIAGNOSIS — S91115A Laceration without foreign body of left lesser toe(s) without damage to nail, initial encounter: Secondary | ICD-10-CM | POA: Diagnosis not present

## 2015-10-23 DIAGNOSIS — F1990 Other psychoactive substance use, unspecified, uncomplicated: Secondary | ICD-10-CM | POA: Diagnosis not present

## 2015-10-23 DIAGNOSIS — W57XXXA Bitten or stung by nonvenomous insect and other nonvenomous arthropods, initial encounter: Secondary | ICD-10-CM

## 2015-10-23 DIAGNOSIS — Y939 Activity, unspecified: Secondary | ICD-10-CM | POA: Insufficient documentation

## 2015-10-23 DIAGNOSIS — Z808 Family history of malignant neoplasm of other organs or systems: Secondary | ICD-10-CM | POA: Diagnosis not present

## 2015-10-23 DIAGNOSIS — Z8249 Family history of ischemic heart disease and other diseases of the circulatory system: Secondary | ICD-10-CM | POA: Diagnosis not present

## 2015-10-23 HISTORY — DX: Other psychoactive substance abuse, uncomplicated: F19.10

## 2015-10-23 HISTORY — DX: Cocaine abuse, uncomplicated: F14.10

## 2015-10-23 HISTORY — DX: Cannabis abuse, uncomplicated: F12.10

## 2015-10-23 HISTORY — DX: Suicidal ideations: R45.851

## 2015-10-23 HISTORY — DX: Bipolar disorder, unspecified: F31.9

## 2015-10-23 LAB — ETHANOL: Alcohol, Ethyl (B): <5 mg/dL (ref <5)

## 2015-10-23 LAB — COMPREHENSIVE METABOLIC PANEL
ALT: 16 U/L (ref 14–54)
AST: 25 U/L (ref 15–41)
Albumin: 4.1 g/dL (ref 3.5–5.0)
Alkaline Phosphatase: 76 U/L (ref 38–126)
Anion gap: 10 (ref 5–15)
BUN: 8 mg/dL (ref 6–20)
CHLORIDE: 104 mmol/L (ref 101–111)
CO2: 26 mmol/L (ref 22–32)
CREATININE: 0.92 mg/dL (ref 0.44–1.00)
Calcium: 9.9 mg/dL (ref 8.9–10.3)
GFR calc non Af Amer: >60 mL/min (ref >60)
Glucose, Bld: 130 mg/dL — ABNORMAL HIGH (ref 65–99)
Potassium: 3.2 mmol/L — ABNORMAL LOW (ref 3.5–5.1)
SODIUM: 140 mmol/L (ref 135–145)
Total Bilirubin: 0.5 mg/dL (ref 0.3–1.2)
Total Protein: 8.1 g/dL (ref 6.5–8.1)

## 2015-10-23 LAB — RAPID URINE DRUG SCREEN, HOSP PERFORMED
AMPHETAMINES: NOT DETECTED
BARBITURATES: NOT DETECTED
BENZODIAZEPINES: POSITIVE — AB
Cocaine: POSITIVE — AB
Opiates: NOT DETECTED
TETRAHYDROCANNABINOL: POSITIVE — AB

## 2015-10-23 LAB — CBC
HEMATOCRIT: 41.8 % (ref 36.0–46.0)
HEMOGLOBIN: 13.6 g/dL (ref 12.0–15.0)
MCH: 28.5 pg (ref 26.0–34.0)
MCHC: 32.5 g/dL (ref 30.0–36.0)
MCV: 87.6 fL (ref 78.0–100.0)
Platelets: 402 10*3/uL — ABNORMAL HIGH (ref 150–400)
RBC: 4.77 MIL/uL (ref 3.87–5.11)
RDW: 15.9 % — AB (ref 11.5–15.5)
WBC: 14.2 10*3/uL — AB (ref 4.0–10.5)

## 2015-10-23 MED ORDER — NICOTINE 21 MG/24HR TD PT24
21.0000 mg | MEDICATED_PATCH | Freq: Every day | TRANSDERMAL | Status: DC
  Administered 2015-10-23 – 2015-10-24 (×2): 21 mg via TRANSDERMAL
  Filled 2015-10-23 (×2): qty 1

## 2015-10-23 MED ORDER — POTASSIUM CHLORIDE CRYS ER 20 MEQ PO TBCR
40.0000 meq | EXTENDED_RELEASE_TABLET | Freq: Once | ORAL | Status: AC
  Administered 2015-10-23: 40 meq via ORAL
  Filled 2015-10-23: qty 2

## 2015-10-23 MED ORDER — IBUPROFEN 200 MG PO TABS: 600.0000 mg | ORAL_TABLET | Freq: Three times a day (TID) | ORAL | Status: DC | PRN

## 2015-10-23 MED ORDER — ALUM & MAG HYDROXIDE-SIMETH 200-200-20 MG/5ML PO SUSP: 30.0000 mL | ORAL | Status: DC | PRN

## 2015-10-23 MED ORDER — ACETAMINOPHEN 325 MG PO TABS
650.0000 mg | ORAL_TABLET | ORAL | Status: DC | PRN
  Administered 2015-10-24: 650 mg via ORAL
  Filled 2015-10-23: qty 2

## 2015-10-23 MED ORDER — LORAZEPAM 1 MG PO TABS
1.0000 mg | ORAL_TABLET | Freq: Three times a day (TID) | ORAL | Status: DC | PRN
  Administered 2015-10-23 – 2015-10-24 (×3): 1 mg via ORAL
  Filled 2015-10-23 (×3): qty 1

## 2015-10-23 MED ORDER — ONDANSETRON HCL 4 MG PO TABS: 4.0000 mg | ORAL_TABLET | Freq: Three times a day (TID) | ORAL | Status: DC | PRN

## 2015-10-23 MED ORDER — TETANUS-DIPHTH-ACELL PERTUSSIS 5-2.5-18.5 LF-MCG/0.5 IM SUSP
0.5000 mL | Freq: Once | INTRAMUSCULAR | Status: AC
  Administered 2015-10-23: 0.5 mL via INTRAMUSCULAR
  Filled 2015-10-23: qty 0.5

## 2015-10-23 MED ORDER — HYDROXYZINE HCL 10 MG/5ML PO SYRP
10.0000 mg | ORAL_SOLUTION | Freq: Once | ORAL | Status: AC
  Administered 2015-10-23: 10 mg via ORAL
  Filled 2015-10-23: qty 5

## 2015-10-23 MED ORDER — ZOLPIDEM TARTRATE 5 MG PO TABS: 5.0000 mg | ORAL_TABLET | Freq: Every evening | ORAL | Status: DC | PRN

## 2015-10-23 NOTE — ED Notes (Signed)
Pt transported to xray 

## 2015-10-23 NOTE — ED Notes (Signed)
Pt reports she does not like the dinner tray that was delivered, minimal food eaten. Pt did not specify dinner order due to sleeping. Ordered pt dinner tray with lasagna.

## 2015-10-23 NOTE — BH Assessment (Signed)
Tele Assessment Note   Melanie Foster is an 60 y.o. female, African American who presents to [facility] ED per ED report:complaining of Suicidal ideation with a plan to jump out in front of traffic. She states that she's been feeling like this for a week, states that her son is married or having the yells at her and calls her names occasionally, she smokes crack cocaine, the last time she smoked was this morning. She is here with a friend of hers who also would like to kill himself. She states that she met him one week ago at a party and they have been spending time together and smoking crack. She denies any alcohol use, she states she has a prior suicide attempt that she tried to slit her wrist, she has anxiety and depression medication which she takes 6 regularly, she is also diabetic and takes metformin also regularly, there is no issues with her getting her medication because her given her for free, she states that she lives at a halfway house and she has itching bug bites, everyone in the halfway house has the same bug bites. He states that she is hearing voices, states that she thinks it's Jesus telling her to end her life and be free from pain. Patient states primary concern is of addressing AH and past trauma[unspecified]. Patient states that the hallucinations [suditory started at age 35, and visual unspecified. Patient states still resides with children, and acknowledges current hx. Of S.A. With Crack Cocaine. Patient states she has not slept any in past days, and unspecified amount of sleep nightly.  Patient acknowledges current SI with plan to jump in front of traffic, and hx. Of SI and plans in past to jump in front of traffic. Patient denies HI or hx. Of. Patient acknowledges current AVH with no mention of command voices stating AH started at age 73 after unspecified trauma. Patient acknowledges current and past hx. Of S.A. With Crack Cocaine use beginning at age 89 with last use today for unspecified  amount. Patient denies hx. Of ETOH. Patient acknowledges hx. Of inpatient psych care with last in September 2017 at [facility] for [place] and S.A. With notable similar plans to jump off bridge/ in front of traffic. Patient is currently being seen outpatient for psych care / therapy with [facility] Care and Dr. Filiberto Pinks.   Diagnosis: Bipolar I Disorder; Cocaine Use Disorder, Severe  Past Medical History:  Past Medical History:  Diagnosis Date  . Anxiety   . Diabetes mellitus without complication ([place])   . Hypercholesteremia   . Hypertension   . Hypothyroid   . Nerve damage    both legs    Past Surgical History:  Procedure Laterality Date  . KNEE SURGERY Left unknown   surgical scar noted  . ROTATOR CUFF REPAIR Right unknown   per client, surgery scar noted  . SPLENECTOMY Right unknown   per client    Family History:  Family History  Problem Relation Age of Onset  . Hypertension Other   . Cancer Mother   . Alcoholism Cousin     Social History:  reports that she has been smoking Cigarettes.  She has been smoking about 0.50 packs per day. She has never used smokeless tobacco. She reports that she drinks alcohol. She reports that she uses drugs, including Codeine, Benzodiazepines, Marijuana, and Cocaine.  Additional Social History:  Alcohol / Drug Use Pain Medications: SEE MAR Prescriptions: SEE MAR Over the Counter: SEE MAR History of alcohol /  drug use?: Yes Longest period of sobriety (when/how long): unknown Negative Consequences of Use: Financial, Legal, Personal relationships Withdrawal Symptoms: Patient aware of relationship between substance abuse and physical/medical complications Substance #1 Name of Substance 1: Crack Cocaine 1 - Age of First Use: 20's 1 - Amount (size/oz): unspecified 1 - Frequency: weekly 1 - Duration: years 1 - Last Use / Amount: 10-22-15, unknown amount  CIWA: CIWA-Ar BP: 134/82 Pulse Rate: 98 COWS:    PATIENT STRENGTHS: (choose at least  two) Active sense of humor Average or above average intelligence Communication skills  Allergies:  Allergies  Allergen Reactions  . Hydrocodone Nausea Only    Oxycodone is fine    Home Medications:  (Not in a hospital admission)  OB/GYN Status:  No LMP recorded. Patient is postmenopausal.  General Assessment Data Location of Assessment: [facility] ED TTS Assessment: In system Is this a Tele or Face-to-Face Assessment?: Tele Assessment Is this an Initial Assessment or a Re-assessment for this encounter?: Initial Assessment Marital status: Divorced [place] name: [name] Is patient pregnant?: No Pregnancy Status: No Living Arrangements: Children Admission Status: Voluntary Is patient capable of signing voluntary admission?: Yes Referral Source: Self/Family/Friend Insurance type: [place] Living Arrangements: Children Name of Psychiatrist: Dr. Filiberto Pinks Name of Therapist: [facility] of Care  Education Status Is patient currently in school?: No Current Grade: n/a Highest grade of school patient has completed: college degree Name of school: n/a Contact person: none given  Risk to self with the past 6 months Suicidal Ideation: Yes-Currently Present Has patient been a risk to self within the past 6 months prior to admission? : Yes Suicidal Intent: Yes-Currently Present Has patient had any suicidal intent within the past 6 months prior to admission? : Yes Is patient at risk for suicide?: Yes Suicidal Plan?: Yes-Currently Present Has patient had any suicidal plan within the past 6 months prior to admission? : Yes Specify Current Suicidal Plan: jump in front of traffic Access to Means: Yes Specify Access to Suicidal Means: access to traffic What has been your use of drugs/alcohol within the last 12 months?: cocaine, severe Previous Attempts/Gestures: Yes How many times?: 2 Other Self Harm Risks: denies Triggers for Past Attempts: Other (Comment) Intentional  Self Injurious Behavior: None Family Suicide History: No Recent stressful life event(s): Trauma (Comment) Persecutory voices/beliefs?: No Depression: Yes Depression Symptoms: Despondent, Insomnia, Tearfulness, Isolating, Fatigue, Guilt, Loss of interest in usual pleasures, Feeling worthless/self pity Substance abuse history and/or treatment for substance abuse?: Yes Suicide prevention information given to non-admitted patients: Yes  Risk to Others within the past 6 months Homicidal Ideation: No Does patient have any lifetime risk of violence toward others beyond the six months prior to admission? : No Thoughts of Harm to Others: No Current Homicidal Intent: No Current Homicidal Plan: No Access to Homicidal Means: No Identified Victim: none History of harm to others?: No Assessment of Violence: None Noted Violent Behavior Description: none noted Does patient have access to weapons?: No Criminal Charges Pending?: No Does patient have a court date: No Is patient on probation?: No  Psychosis Hallucinations: Auditory, Visual Delusions: None noted  Mental Status Report Appearance/Hygiene: In scrubs, Disheveled Eye Contact: Poor Motor Activity: Freedom of movement Speech: Logical/coherent Level of Consciousness: Drowsy Mood: Depressed Affect: Depressed Anxiety Level: Moderate Thought Processes: Coherent Judgement: Partial Orientation: Person, Place, Time, Situation, Appropriate for developmental age Obsessive Compulsive Thoughts/Behaviors: None  Cognitive Functioning Concentration: Decreased Memory: Recent Intact, Remote Impaired IQ: Average Insight:  Poor Impulse Control: Poor Appetite: Fair Weight Loss: 0 Weight Gain: 0 Sleep: Decreased Total Hours of Sleep: 0 Vegetative Symptoms: Staying in bed  ADLScreening [facility] Assessment Services) Patient's cognitive ability adequate to safely complete daily activities?: Yes Patient able to express need for assistance with  ADLs?: Yes Independently performs ADLs?: Yes (appropriate for developmental age)  Prior Inpatient Therapy Prior Inpatient Therapy: Yes Prior Therapy Dates: 2017 Prior Therapy Facilty/Provider(s): [facility], Mikel Cella Reason for Treatment: SI/ S.A., Bipolar  Prior Outpatient Therapy Prior Outpatient Therapy: Yes Prior Therapy Dates: current Prior Therapy Facilty/Provider(s): [facility] of Care Reason for Treatment: S.A, Bipolar Does patient have an ACCT team?: No Does patient have Intensive In-House Services?  : No Does patient have [facility] services? : No Does patient have P4CC services?: No  ADL Screening (condition at time of admission) Patient's cognitive ability adequate to safely complete daily activities?: Yes Is the patient deaf or have difficulty hearing?: No Does the patient have difficulty seeing, even when wearing glasses/contacts?: No Does the patient have difficulty concentrating, remembering, or making decisions?: No Patient able to express need for assistance with ADLs?: Yes Does the patient have difficulty dressing or bathing?: No Independently performs ADLs?: Yes (appropriate for developmental age) Does the patient have difficulty walking or climbing stairs?: No Weakness of Legs: None Weakness of Arms/Hands: None       Abuse/Neglect Assessment (Assessment to be complete while patient is alone) Physical Abuse: Denies Verbal Abuse: Denies Sexual Abuse: Denies Self-Neglect: Denies Values / Beliefs Cultural Requests During Hospitalization: None Spiritual Requests During Hospitalization: Hospital staff spiritual visit, Other (comment) (request see chaplain/ grief/ trauma support) Consults Spiritual Care Consult Needed: Yes (Comment) (request speak w/ chaplain past trauma/ support grief) Advance Directives (For Healthcare) Does patient have an advance directive?: No Would patient like information on creating an advanced directive?: No - patient declined  information    Additional Information 1:1 In Past 12 Months?: No CIRT Risk: No Elopement Risk: No Does patient have medical clearance?: Yes     Disposition: Per Jinny Blossom, NP patient to remain in ED overnight for observation and re-eval by psych in a.m. Disposition Initial Assessment Completed for this Encounter: Yes Disposition of Patient: Other dispositions (TBD)  Kristeen Mans 10/23/2015 9:49 AM

## 2015-10-23 NOTE — ED Notes (Signed)
Ordered lunch tray 

## 2015-10-23 NOTE — Progress Notes (Signed)
Disposition CSW completed patient referrals to the following inpatient psych facilities:  [facility] will continue to follow patient for placement needs.  [facility] Disposition CSW [phone]

## 2015-10-23 NOTE — Progress Notes (Signed)
Per Jinny Blossom, NP pt. To remain overnight for observation and re-eval for psych in a.m. Pt Rn. Notified of disposition and pt. Consult/ request to see Chaplain. Glena Pharris K. Nash Shearer, LPC-A, [facility]  Counselor 10/23/2015 10:11 AM

## 2015-10-23 NOTE — ED Provider Notes (Signed)
[facility] DEPT Provider Note   CSN: [id number] Arrival date & time: 10/23/15  0424     History   Chief Complaint Chief Complaint  Patient presents with  . Suicidal  . Addiction Problem    HPI  Blood pressure 147/87, pulse 105, temperature 99.1 F (37.3 C), temperature source Oral, resp. rate 20, SpO2 99 %.  Melanie Foster is a 60 y.o. female complaining of Suicidal ideation with a plan to jump out in front of traffic. She states that she's been feeling like this for a week, states that her son is married or having the yells at her and calls her names occasionally, she smokes crack cocaine, the last time she smoked was this morning. She is here with a friend of hers who also would like to kill himself. She states that she met him one week ago at a party and they have been spending time together and smoking crack. She denies any alcohol use, she states she has a prior suicide attempt that she tried to slit her wrist, she has anxiety and depression medication which she takes 3 regularly, she is also diabetic and takes metformin also regularly, there is no issues with her getting her medication because her given her for free, she states that she lives at a halfway house and she has itching bug bites, everyone in the halfway house has the same bug bites. He states that she is hearing voices, states that she thinks it's Jesus telling her to end her life and be free from pain. He should also notes that she has pain to the left second toe where a crack pipe fell off the table and hit her foot, she states it was bleeding extensively, last tetanus shot is unknown. She's been ambulatory, no pain medication taken prior to arrival. Denies chest pain, abdominal pain, nausea, vomiting, shortness of breath or cough.  HPI  Past Medical History:  Diagnosis Date  . Anxiety   . Diabetes mellitus without complication ([place])   . Hypercholesteremia   . Hypertension   . Hypothyroid   . Nerve damage    both  legs    Patient Active Problem List   Diagnosis Date Noted  . Bipolar disorder, curr episode mixed, severe, w/o psychotic features ([place]) 09/16/2015  . Cocaine use disorder, severe, dependence ([place]) 09/16/2015  . Cannabis use disorder, mild, abuse 09/16/2015  . Polysubstance dependence ([place]) 09/15/2015  . Suicidal ideation 07/14/2015  . Hypertension 07/14/2015  . Drug overdose 07/14/2015  . Hypokalemia 07/14/2015  . Cocaine abuse 07/14/2015  . Marijuana abuse 07/14/2015  . Chronic back pain 07/14/2015  . Suicide attempt   . Herpes simplex 04/28/2015  . Tobacco abuse 04/28/2015  . Chronic bronchitis ([place]) 04/28/2015  . DM neuropathy, type II diabetes mellitus ([place]) 04/28/2015  . Bipolar 1 disorder, mixed, severe ([place]) 04/22/2015  . Hyperlipidemia 04/22/2015  . Essential hypertension 04/22/2015  . Type 2 diabetes mellitus with hyperglycemia ([place]) 04/22/2015  . Metabolic acidosis 65/79/0383  . Hypothyroidism 04/22/2015    Past Surgical History:  Procedure Laterality Date  . KNEE SURGERY Left unknown   surgical scar noted  . ROTATOR CUFF REPAIR Right unknown   per client, surgery scar noted  . SPLENECTOMY Right unknown   per client    OB History    No data available       Home Medications    Prior to Admission medications   Medication Sig Start Date End Date Taking? Authorizing Provider  acetaminophen (TYLENOL) 325  MG tablet Take 2 tablets (650 mg total) by mouth every 6 (six) hours as needed for mild pain, moderate pain or headache. 09/23/15   Encarnacion Slates, NP  aspirin EC 81 MG tablet Take 1 tablet (81 mg total) by mouth daily at 12 noon. For heart health 09/23/15   Encarnacion Slates, NP  blood glucose meter kit and supplies KIT Dispense based on patient and insurance preference. Use up to four times daily as directed. (FOR ICD-9 250.00, 250.01): For diabetes management 09/23/15   Encarnacion Slates, NP  Blood Pressure Monitoring (BLOOD PRESSURE CUFF) MISC 1 application by Does  not apply route daily after breakfast. For blood pressure monitoring 09/23/15   Encarnacion Slates, NP  divalproex (DEPAKOTE ER) 500 MG 24 hr tablet Take 2 tablets (1,000 mg total) by mouth at bedtime. For 09/23/15   Encarnacion Slates, NP  glucose blood (ONE TOUCH ULTRA TEST) test strip Use as instructed: For blood sugar monitor 09/23/15   Encarnacion Slates, NP  hydrOXYzine (ATARAX/VISTARIL) 25 MG tablet Take 1 tablet (25 mg total) by mouth every 6 (six) hours as needed for anxiety. 09/23/15   Encarnacion Slates, NP  Lancets [facility] ULTRASOFT) lancets Use as instructed: For blood sugar monitor 09/23/15   Encarnacion Slates, NP  levothyroxine (SYNTHROID, LEVOTHROID) 25 MCG tablet Take 1 tablet (25 mcg total) by mouth daily before breakfast. For thyroid hormone replacement 09/23/15   Encarnacion Slates, NP  LORazepam (ATIVAN) 0.5 MG tablet Take 1 tablet (0.5 mg total) by mouth every 8 (eight) hours as needed for anxiety (withdrawal). 09/23/15   Encarnacion Slates, NP  meloxicam (MOBIC) 7.5 MG tablet Take 1 tablet (7.5 mg total) by mouth daily. For arthritis 09/24/15   Encarnacion Slates, NP  metFORMIN (GLUCOPHAGE) 500 MG tablet Take 1 tablet (500 mg total) by mouth daily with breakfast. For diabetes management 09/23/15   Encarnacion Slates, NP  nicotine (NICODERM CQ - DOSED IN MG/24 HOURS) 21 mg/24hr patch Place 1 patch (21 mg total) onto the skin daily. For nicotine addiction 09/23/15   Encarnacion Slates, NP  omeprazole (PRILOSEC) 20 MG capsule Take 1 capsule (20 mg total) by mouth daily. For acid reflux 09/23/15   Encarnacion Slates, NP  pregabalin (LYRICA) 50 MG capsule Take 1 capsule (50 mg total) by mouth 3 (three) times daily. For fibromyalgia 09/23/15   Encarnacion Slates, NP  QUEtiapine (SEROQUEL XR) 50 MG TB24 24 hr tablet Take 1 tablet (50 mg total) by mouth daily. For mood control 09/24/15   Encarnacion Slates, NP  QUEtiapine (SEROQUEL) 400 MG tablet Take 1 tablet (400 mg total) by mouth at bedtime. For mood control 09/23/15   Encarnacion Slates, NP  verapamil (CALAN) 40  MG tablet Take 1 tablet (40 mg total) by mouth 2 (two) times daily. Please stop the [facility] when you starts this medicine: For high blood pressure 09/23/15   Encarnacion Slates, NP    Family History Family History  Problem Relation Age of Onset  . Hypertension Other   . Cancer Mother   . Alcoholism Cousin     Social History Social History  Substance Use Topics  . Smoking status: Current Every Day Smoker    Packs/day: 0.50    Types: Cigarettes  . Smokeless tobacco: Never Used  . Alcohol use Yes     Allergies   Hydrocodone   Review of Systems Review of Systems  10 systems reviewed and  found to be negative, except as noted in the HPI.   Physical Exam Updated Vital Signs BP 134/82 (BP Location: Right Arm)   Pulse 98   Temp 99.6 F (37.6 C) (Oral)   Resp 20   SpO2 98%   Physical Exam  Constitutional: She is oriented to person, place, and time. She appears well-developed and well-nourished. No distress.  HENT:  Head: Normocephalic and atraumatic.  Mouth/Throat: Oropharynx is clear and moist.  Eyes: Conjunctivae and EOM are normal. Pupils are equal, round, and reactive to light.  Neck: Normal range of motion.  Cardiovascular: Normal rate, regular rhythm and intact distal pulses.   Pulmonary/Chest: Effort normal and breath sounds normal.  Abdominal: Soft. There is no tenderness.  Musculoskeletal: Normal range of motion. She exhibits tenderness.  Left second digit with 2 mm laceration on the medial aspect of the nailbed. No warmth, or discharge. Diffusely tender to palpation along the toe, no tenderness palpation along the foot.  Multiple excoriated raised erythematous lesions consistent with bug bites to the bilateral upper extremities, low back, does not seem to coalesce in the intertriginous areas, lesions are blanchable and spare the palms soles and mucous membranes.  Neurological: She is alert and oriented to person, place, and time.  Skin: She is not diaphoretic.    Psychiatric: Her speech is rapid and/or pressured. She is agitated and hyperactive. She expresses suicidal ideation. She expresses no homicidal ideation.  Agitated, labile, redirectable  Nursing note and vitals reviewed.    ED Treatments / Results  Labs (all labs ordered are listed, but only abnormal results are displayed) Labs Reviewed  COMPREHENSIVE METABOLIC PANEL - Abnormal; Notable for the following:       Result Value   Potassium 3.2 (*)    Glucose, Bld 130 (*)    All other components within normal limits  CBC - Abnormal; Notable for the following:    WBC 14.2 (*)    RDW 15.9 (*)    Platelets 402 (*)    All other components within normal limits  RAPID URINE DRUG SCREEN, HOSP PERFORMED - Abnormal; Notable for the following:    Cocaine POSITIVE (*)    Benzodiazepines POSITIVE (*)    Tetrahydrocannabinol POSITIVE (*)    All other components within normal limits  ETHANOL    EKG  EKG Interpretation None       Radiology Dg Foot Complete Left  Result Date: 10/23/2015 CLINICAL DATA:  Pain and numbness in second toe. EXAM: LEFT FOOT - COMPLETE 3+ VIEW COMPARISON:  None. FINDINGS: There is no evidence of fracture or dislocation. There is no evidence of arthropathy or other focal bone abnormality. Soft tissues are unremarkable. IMPRESSION: Negative. Electronically Signed   By: Misty Stanley M.D.   On: 10/23/2015 09:29    Procedures Procedures (including critical care time)  Medications Ordered in ED Medications  alum & mag hydroxide-simeth (MAALOX/MYLANTA) 200-200-20 MG/5ML suspension 30 mL (not administered)  ondansetron (ZOFRAN) tablet 4 mg (not administered)  nicotine (NICODERM CQ - dosed in mg/24 hours) patch 21 mg (21 mg Transdermal Patch Applied 10/23/15 0923)  zolpidem (AMBIEN) tablet 5 mg (not administered)  ibuprofen (ADVIL,MOTRIN) tablet 600 mg (not administered)  acetaminophen (TYLENOL) tablet 650 mg (not administered)  LORazepam (ATIVAN) tablet 1 mg (not  administered)  potassium chloride SA (K-DUR,KLOR-CON) CR tablet 40 mEq (40 mEq Oral Given 10/23/15 0837)  Tdap (BOOSTRIX) injection 0.5 mL (0.5 mLs Intramuscular Given 10/23/15 0840)  hydrOXYzine (ATARAX) 10 MG/5ML syrup 10 mg (10 mg  Oral Given 10/23/15 0921)     Initial Impression / Assessment and Plan / ED Course  I have reviewed the triage vital signs and the nursing notes.  Pertinent labs & imaging results that were available during my care of the patient were reviewed by me and considered in my medical decision making (see chart for details).  Clinical Course    Vitals:   10/23/15 0432 10/23/15 0845 10/23/15 0845  BP: 147/87 134/82 134/82  Pulse: 105 99 98  Resp: 20  20  Temp: 99.1 F (37.3 C)  99.6 F (37.6 C)  TempSrc: Oral  Oral  SpO2: 99% 98% 98%    Medications  alum & mag hydroxide-simeth (MAALOX/MYLANTA) 200-200-20 MG/5ML suspension 30 mL (not administered)  ondansetron (ZOFRAN) tablet 4 mg (not administered)  nicotine (NICODERM CQ - dosed in mg/24 hours) patch 21 mg (21 mg Transdermal Patch Applied 10/23/15 0923)  zolpidem (AMBIEN) tablet 5 mg (not administered)  ibuprofen (ADVIL,MOTRIN) tablet 600 mg (not administered)  acetaminophen (TYLENOL) tablet 650 mg (not administered)  LORazepam (ATIVAN) tablet 1 mg (not administered)  potassium chloride SA (K-DUR,KLOR-CON) CR tablet 40 mEq (40 mEq Oral Given 10/23/15 0837)  Tdap (BOOSTRIX) injection 0.5 mL (0.5 mLs Intramuscular Given 10/23/15 0840)  hydrOXYzine (ATARAX) 10 MG/5ML syrup 10 mg (10 mg Oral Given 10/23/15 0160)    Melanie Foster is 60 y.o. female presenting with Suicidal ideation, also uses crack cocaine, she is hearing voices and has a plan to step out in front of traffic, she has a prior suicide attempt, has been intermittently compliant with her psychiatric medications. Mild hypokalemia, will replete orally, UDS positive for THC, cocaine and benzodiazepines. She is reporting a pain to the left second toe, there  is a small cut on that toe, tetanus is updated. Will obtain x-ray. Patient also has blood bites consistent with bedbugs, Atarax for comfort.  Patient is medically cleared for psychiatric evaluation will be transferred to the psych ED. TTS consulted, home meds and psych standard holding orders placed.    Final Clinical Impressions(s) / ED Diagnoses   Final diagnoses:  Polysubstance abuse  Suicidal ideation  Insect bite, initial encounter  Contusion of toe without damage to nail, unspecified toe, initial encounter  Auditory hallucination    New Prescriptions New Prescriptions   No medications on file     Monico Blitz, PA-C 10/23/15 [place], MD 10/23/15 [phone]

## 2015-10-23 NOTE — ED Notes (Signed)
Pt belongings and valuables inventoried, wanded by security. Belongings placed in belongings storage closet.

## 2015-10-23 NOTE — ED Notes (Signed)
Meal tray at bedside.  

## 2015-10-23 NOTE — ED Triage Notes (Signed)
Pt requesting detox from cocaine (last used 1 hour ago) and etoh (last used 7 hours ago).  States her kids have addiction problems and it is making her addiction worse.  Pt also reports being suicidal with a plan to walk out into traffic.  Pt arrived with another pt that is also SI.  Pt keeps walking over to his room.  Informed pt she would need to stay in her triage room.  Pt states, "I want you to know that we have a plan to walk into traffic together."

## 2015-10-23 NOTE — ED Notes (Signed)
Patient moaning out loud stating "neither of [place] has eaten for 2 days, can we have something to eat".  Explained that she needs to be seen by a doctor before we can give her anything to eat

## 2015-10-23 NOTE — ED Notes (Signed)
Patient has attempted several times to go see her friend in triage room 2.  Explained that she needed to stay in her room and she stated "we are together".

## 2015-10-23 NOTE — Progress Notes (Signed)
Called Pt. RN, to set up machine for TTS. Pt. Rn stated TTS machine is in PEDES, other TTS machine is down, has been reported [TTS for PEDS currently taking place]. Will set up  Machine when ready Destinie Thornsberry K. Nash Shearer, LPC-A, [facility]  Counselor 10/23/2015 8:47 AM

## 2015-10-23 NOTE — ED Notes (Signed)
Staffing Office aware pt now in C24.

## 2015-10-24 ENCOUNTER — Encounter (HOSPITAL_COMMUNITY): Payer: Self-pay | Admitting: *Deleted

## 2015-10-24 DIAGNOSIS — Z79899 Other long term (current) drug therapy: Secondary | ICD-10-CM

## 2015-10-24 DIAGNOSIS — Z808 Family history of malignant neoplasm of other organs or systems: Secondary | ICD-10-CM | POA: Diagnosis not present

## 2015-10-24 DIAGNOSIS — Z8249 Family history of ischemic heart disease and other diseases of the circulatory system: Secondary | ICD-10-CM | POA: Diagnosis not present

## 2015-10-24 DIAGNOSIS — Z811 Family history of alcohol abuse and dependence: Secondary | ICD-10-CM | POA: Diagnosis not present

## 2015-10-24 DIAGNOSIS — F1410 Cocaine abuse, uncomplicated: Secondary | ICD-10-CM

## 2015-10-24 DIAGNOSIS — S91115A Laceration without foreign body of left lesser toe(s) without damage to nail, initial encounter: Secondary | ICD-10-CM | POA: Diagnosis not present

## 2015-10-24 DIAGNOSIS — F17210 Nicotine dependence, cigarettes, uncomplicated: Secondary | ICD-10-CM

## 2015-10-24 LAB — CBG MONITORING, ED: GLUCOSE-CAPILLARY: 244 mg/dL — AB (ref 65–99)

## 2015-10-24 MED ORDER — VERAPAMIL HCL 40 MG PO TABS
40.0000 mg | ORAL_TABLET | Freq: Two times a day (BID) | ORAL | Status: DC
  Administered 2015-10-24: 40 mg via ORAL
  Filled 2015-10-24 (×2): qty 1

## 2015-10-24 MED ORDER — DIVALPROEX SODIUM ER 500 MG PO TB24
1000.0000 mg | ORAL_TABLET | Freq: Every day | ORAL | Status: DC
  Administered 2015-10-24: 1000 mg via ORAL
  Filled 2015-10-24: qty 2

## 2015-10-24 MED ORDER — DOXYCYCLINE HYCLATE 100 MG PO TABS
100.0000 mg | ORAL_TABLET | Freq: Two times a day (BID) | ORAL | Status: DC
  Administered 2015-10-24 (×2): 100 mg via ORAL
  Filled 2015-10-24 (×3): qty 1

## 2015-10-24 MED ORDER — LEVOTHYROXINE SODIUM 25 MCG PO TABS
25.0000 ug | ORAL_TABLET | Freq: Every day | ORAL | Status: DC
  Filled 2015-10-24: qty 1

## 2015-10-24 MED ORDER — DOXYCYCLINE HYCLATE 100 MG PO CAPS: 100.0000 mg | ORAL_CAPSULE | Freq: Two times a day (BID) | ORAL | 0 refills | Status: DC

## 2015-10-24 MED ORDER — QUETIAPINE FUMARATE ER 50 MG PO TB24
50.0000 mg | ORAL_TABLET | Freq: Every day | ORAL | Status: DC
  Filled 2015-10-24: qty 1

## 2015-10-24 MED ORDER — PANTOPRAZOLE SODIUM 40 MG PO TBEC
40.0000 mg | DELAYED_RELEASE_TABLET | Freq: Every day | ORAL | Status: DC
  Administered 2015-10-24: 40 mg via ORAL
  Filled 2015-10-24: qty 1

## 2015-10-24 MED ORDER — PREGABALIN 50 MG PO CAPS
50.0000 mg | ORAL_CAPSULE | Freq: Three times a day (TID) | ORAL | Status: DC
Start: 2015-10-24 — End: 2015-10-25
  Administered 2015-10-24 (×3): 50 mg via ORAL
  Filled 2015-10-24 (×3): qty 1

## 2015-10-24 MED ORDER — METFORMIN HCL 500 MG PO TABS
500.0000 mg | ORAL_TABLET | Freq: Every day | ORAL | Status: DC
  Filled 2015-10-24 (×2): qty 1

## 2015-10-24 NOTE — ED Notes (Signed)
Pt placed in hospital gown for comfort d/t rash.

## 2015-10-24 NOTE — ED Provider Notes (Signed)
Patient was discharged from the emergency department approximately 1 hour ago. She then apparently tried to walk in front of traffic. She was returned to the ED.   Nat Christen, MD 10/24/15 1620

## 2015-10-24 NOTE — ED Notes (Signed)
Pt requesting to shower prior to Nicotine Patch being applied.

## 2015-10-24 NOTE — ED Notes (Signed)
Pt given Tylenol as requested for chronic back pain.

## 2015-10-24 NOTE — ED Notes (Signed)
Pt attempted to sign for d/c paperwork - - signature pad not working. Pt upset d/t states she wanted to call her son before leaving. Advised pt she may use the phone in lobby. Pt then stated she did not have his phone number. Pt attempting to refuse to leave. Security arrived - pt then stated she would go but refused to take any of her belongings. Pt escorted to lobby by security - carried pt's belongings d/t pt refused to take them from room.

## 2015-10-24 NOTE — ED Notes (Signed)
Pt at RN station using phone to call family

## 2015-10-24 NOTE — ED Notes (Addendum)
Pt returned back into ED w/security after attempting to step out in front of traffic - pt's belongings placed at nurses' desk - 2 labeled belongings bags. Pt changed into burgundy paper scrubs from blue scrubs she had changed into earlier. Wanded by security. States she does not have anywhere to go and is SI. Dr Lacinda Axon aware.

## 2015-10-24 NOTE — Consult Note (Signed)
Telepsych Consultation   Reason for Consult: Suicidal ideations Referring Physician:  EDP Patient Identification: Melanie Foster MRN:  [id number] Principal Diagnosis: Cocaine abuse   Diagnosis:   Patient Active Problem List   Diagnosis Date Noted  . Bipolar disorder, curr episode mixed, severe, w/o psychotic features ([place]) [F31.63] 09/16/2015  . Cocaine use disorder, severe, dependence ([place]) [F14.20] 09/16/2015  . Cannabis use disorder, mild, abuse [F12.10] 09/16/2015  . Polysubstance dependence ([place]) [F19.20] 09/15/2015  . Suicidal ideation [R45.851] 07/14/2015  . Hypertension [I10] 07/14/2015  . Drug overdose [T50.901A] 07/14/2015  . Hypokalemia [E87.6] 07/14/2015  . Cocaine abuse [F14.10] 07/14/2015  . Marijuana abuse [F12.10] 07/14/2015  . Chronic back pain [M54.9, G89.29] 07/14/2015  . Suicide attempt [T14.91XA]   . Herpes simplex [B00.9] 04/28/2015  . Tobacco abuse [Z72.0] 04/28/2015  . Chronic bronchitis ([place]) [J42] 04/28/2015  . DM neuropathy, type II diabetes mellitus ([place]) [E11.40] 04/28/2015  . Bipolar 1 disorder, mixed, severe ([place]) [F31.63] 04/22/2015  . Hyperlipidemia [E78.5] 04/22/2015  . Essential hypertension [I10] 04/22/2015  . Type 2 diabetes mellitus with hyperglycemia ([place]) [E11.65] 04/22/2015  . Metabolic acidosis [B35.3] 04/22/2015  . Hypothyroidism [E03.9] 04/22/2015    Total Time spent with patient: 20 minutes  Subjective:   Melanie Foster is a 60 y.o. female patient admitted with suicidal ideations with a plan to walk out in traffic. Pt states I don't feel that way today." Pt states "she was with a friend when she came to the hospital and they had planned to walk out into traffic together."  HPI: Melanie Foster is an 60 y.o. female, African American who presents to [facility] ED per ED report:complaining of Suicidal ideation with a plan to jump out in front of traffic. She states that she's been feeling like this for a week, states that her son is married or having the yells  at her and calls her names occasionally, she smokes crack cocaine, the last time she smoked was this morning. She is here with a friend of hers who also would like to kill himself. She states that she met him one week ago at a party and they have been spending time together and smoking crack. She denies any alcohol use, she states she has a prior suicide attempt that she tried to slit her wrist, she has anxiety and depression medication which she takes 58 regularly, she is also diabetic and takes metformin also regularly, there is no issues with her getting her medication because her given her for free, she states that she lives at a halfway house and she has itching bug bites, everyone in the halfway house has the same bug bites. He states that she is hearing voices, states that she thinks it's Jesus telling her to end her life and be free from pain. Patient states primary concern is of addressing AH and past trauma[unspecified]. Patient states that the hallucinations [suditory started at age 44, and visual unspecified. Patient states still resides with children, and acknowledges current hx. Of S.A. With Crack Cocaine. Patient states she has not slept any in past days, and unspecified amount of sleep nightly.  Patient acknowledges current SI with plan to jump in front of traffic, and hx. Of SI and plans in past to jump in front of traffic. Patient denies HI or hx. Of. Patient acknowledges current AVH with no mention of command voices stating AH started at age 4 after unspecified trauma. Patient acknowledges current and past hx. Of S.A. With Crack Cocaine use beginning at age  21 with last use today for unspecified amount. Patient denies hx. Of ETOH. Patient acknowledges hx. Of inpatient psych care with last in September 2017 at [facility] for [place] and S.A. With notable similar plans to jump off bridge/ in front of traffic. Patient is currently being seen outpatient for psych care / therapy with [facility] and  Dr. Filiberto Pinks.   I have reviewed the chart and agree with the above findings updated today as follows:  Today Pt is calm but somewhat evasive with her response to questions, saying "I don't know or I don't remember." . Pt is alert and oriented x 3, denies suicidal/homicidal ideations and also denies auditory/visual hallucinations, she does not appear to be responding to internal stimuli.   Past Psychiatric History: Schizophrenia (per patient), Polysubstance abuse, Bipolar disorder  Risk to Self: Suicidal Ideation: Yes-Currently Present Suicidal Intent: Yes-Currently Present Is patient at risk for suicide?: Yes Suicidal Plan?: Yes-Currently Present Specify Current Suicidal Plan: jump in front of traffic Access to Means: Yes Specify Access to Suicidal Means: access to traffic What has been your use of drugs/alcohol within the last 12 months?: cocaine, severe How many times?: 2 Other Self Harm Risks: denies Triggers for Past Attempts: Other (Comment) Intentional Self Injurious Behavior: None Risk to Others: Homicidal Ideation: No Thoughts of Harm to Others: No Current Homicidal Intent: No Current Homicidal Plan: No Access to Homicidal Means: No Identified Victim: none History of harm to others?: No Assessment of Violence: None Noted Violent Behavior Description: none noted Does patient have access to weapons?: No Criminal Charges Pending?: No Does patient have a court date: No Prior Inpatient Therapy: Prior Inpatient Therapy: Yes Prior Therapy Dates: 2017 Prior Therapy Facilty/Provider(s): [facility], Mikel Cella Reason for Treatment: SI/ S.A., Bipolar Prior Outpatient Therapy: Prior Outpatient Therapy: Yes Prior Therapy Dates: current Prior Therapy Facilty/Provider(s): [facility] Reason for Treatment: S.A, Bipolar Does patient have an ACCT team?: No Does patient have Intensive In-House Services?  : No Does patient have [facility] services? : No Does patient have P4CC services?:  No  Past Medical History:  Past Medical History:  Diagnosis Date  . Anxiety   . Diabetes mellitus without complication ([place])   . Hypercholesteremia   . Hypertension   . Hypothyroid   . Nerve damage    both legs    Past Surgical History:  Procedure Laterality Date  . KNEE SURGERY Left unknown   surgical scar noted  . ROTATOR CUFF REPAIR Right unknown   per client, surgery scar noted  . SPLENECTOMY Right unknown   per client   Family History:  Family History  Problem Relation Age of Onset  . Hypertension Other   . Cancer Mother   . Alcoholism Cousin    Family Psychiatric  History: unknown Social History:  History  Alcohol Use  . Yes     History  Drug Use  . Types: Codeine, Benzodiazepines, Marijuana, Cocaine    Social History   Social History  . Marital status: Single    Spouse name: N/A  . Number of children: N/A  . Years of education: N/A   Social History Main Topics  . Smoking status: Current Every Day Smoker    Packs/day: 0.50    Types: Cigarettes  . Smokeless tobacco: Never Used  . Alcohol use Yes  . Drug use:     Types: Codeine, Benzodiazepines, Marijuana, Cocaine  . Sexual activity: Not Currently   Other Topics Concern  . None   Social History  Narrative  . None   Additional Social History:    Allergies:   Allergies  Allergen Reactions  . Hydrocodone Nausea Only    Tolerates oxycodone    Labs:  Results for orders placed or performed during the hospital encounter of 10/23/15 (from the past 48 hour(s))  Comprehensive metabolic panel     Status: Abnormal   Collection Time: 10/23/15  4:54 AM  Result Value Ref Range   Sodium 140 135 - 145 mmol/L   Potassium 3.2 (L) 3.5 - 5.1 mmol/L   Chloride 104 101 - 111 mmol/L   CO2 26 22 - 32 mmol/L   Glucose, Bld 130 (H) 65 - 99 mg/dL   BUN 8 6 - 20 mg/dL   Creatinine, Ser 0.92 0.44 - 1.00 mg/dL   Calcium 9.9 8.9 - 10.3 mg/dL   Total Protein 8.1 6.5 - 8.1 g/dL   Albumin 4.1 3.5 - 5.0 g/dL    AST 25 15 - 41 U/L   ALT 16 14 - 54 U/L   Alkaline Phosphatase 76 38 - 126 U/L   Total Bilirubin 0.5 0.3 - 1.2 mg/dL   GFR calc non Af Amer >60 >60 mL/min   GFR calc Af Amer >60 >60 mL/min    Comment: (NOTE) The eGFR has been calculated using the CKD EPI equation. This calculation has not been validated in all clinical situations. eGFR's persistently <60 mL/min signify possible Chronic Kidney Disease.    Anion gap 10 5 - 15  cbc     Status: Abnormal   Collection Time: 10/23/15  4:54 AM  Result Value Ref Range   WBC 14.2 (H) 4.0 - 10.5 K/uL   RBC 4.77 3.87 - 5.11 MIL/uL   Hemoglobin 13.6 12.0 - 15.0 g/dL   HCT 41.8 36.0 - 46.0 %   MCV 87.6 78.0 - 100.0 fL   MCH 28.5 26.0 - 34.0 pg   MCHC 32.5 30.0 - 36.0 g/dL   RDW 15.9 (H) 11.5 - 15.5 %   Platelets 402 (H) 150 - 400 K/uL  Rapid urine drug screen (hospital performed)     Status: Abnormal   Collection Time: 10/23/15  4:54 AM  Result Value Ref Range   Opiates NONE DETECTED NONE DETECTED   Cocaine POSITIVE (A) NONE DETECTED   Benzodiazepines POSITIVE (A) NONE DETECTED   Amphetamines NONE DETECTED NONE DETECTED   Tetrahydrocannabinol POSITIVE (A) NONE DETECTED   Barbiturates NONE DETECTED NONE DETECTED    Comment:        DRUG SCREEN FOR MEDICAL PURPOSES ONLY.  IF CONFIRMATION IS NEEDED FOR ANY PURPOSE, NOTIFY LAB WITHIN 5 DAYS.        LOWEST DETECTABLE LIMITS FOR URINE DRUG SCREEN Drug Class       Cutoff (ng/mL) Amphetamine      1000 Barbiturate      200 Benzodiazepine   947 Tricyclics       654 Opiates          300 Cocaine          300 THC              50   Ethanol     Status: None   Collection Time: 10/23/15  4:55 AM  Result Value Ref Range   Alcohol, Ethyl (B) <5 <5 mg/dL    Comment:        LOWEST DETECTABLE LIMIT FOR SERUM ALCOHOL IS 5 mg/dL FOR MEDICAL PURPOSES ONLY     Current Facility-Administered Medications  Medication  Dose Route Frequency Provider Last Rate Last Dose  . acetaminophen (TYLENOL)  tablet 650 mg  650 mg Oral Q4H PRN Nicole Pisciotta, PA-C      . alum & mag hydroxide-simeth (MAALOX/MYLANTA) 200-200-20 MG/5ML suspension 30 mL  30 mL Oral PRN Nicole Pisciotta, PA-C      . ibuprofen (ADVIL,MOTRIN) tablet 600 mg  600 mg Oral Q8H PRN Nicole Pisciotta, PA-C      . LORazepam (ATIVAN) tablet 1 mg  1 mg Oral Q8H PRN Nicole Pisciotta, PA-C   1 mg at 10/23/15 1454  . nicotine (NICODERM CQ - dosed in mg/24 hours) patch 21 mg  21 mg Transdermal Daily Nicole Pisciotta, PA-C   21 mg at 10/23/15 0923  . ondansetron (ZOFRAN) tablet 4 mg  4 mg Oral Q8H PRN Nicole Pisciotta, PA-C      . zolpidem (AMBIEN) tablet 5 mg  5 mg Oral QHS PRN Monico Blitz, PA-C       Current Outpatient Prescriptions  Medication Sig Dispense Refill  . acetaminophen (TYLENOL) 325 MG tablet Take 2 tablets (650 mg total) by mouth every 6 (six) hours as needed for mild pain, moderate pain or headache.    Marland Kitchen aspirin EC 81 MG tablet Take 1 tablet (81 mg total) by mouth daily at 12 noon. For heart health    . blood glucose meter kit and supplies KIT Dispense based on patient and insurance preference. Use up to four times daily as directed. (FOR ICD-9 250.00, 250.01): For diabetes management 1 each 0  . Blood Pressure Monitoring (BLOOD PRESSURE CUFF) MISC 1 application by Does not apply route daily after breakfast. For blood pressure monitoring 1 each 0  . divalproex (DEPAKOTE ER) 500 MG 24 hr tablet Take 2 tablets (1,000 mg total) by mouth at bedtime. For 30 tablet 0  . glucose blood (ONE TOUCH ULTRA TEST) test strip Use as instructed: For blood sugar monitor 100 each 12  . hydrOXYzine (ATARAX/VISTARIL) 25 MG tablet Take 1 tablet (25 mg total) by mouth every 6 (six) hours as needed for anxiety. 60 tablet 0  . Lancets (ONETOUCH ULTRASOFT) lancets Use as instructed: For blood sugar monitor 100 each 12  . levothyroxine (SYNTHROID, LEVOTHROID) 25 MCG tablet Take 1 tablet (25 mcg total) by mouth daily before breakfast. For  thyroid hormone replacement 30 tablet 3  . LORazepam (ATIVAN) 0.5 MG tablet Take 1 tablet (0.5 mg total) by mouth every 8 (eight) hours as needed for anxiety (withdrawal). 4 tablet 0  . meloxicam (MOBIC) 7.5 MG tablet Take 1 tablet (7.5 mg total) by mouth daily. For arthritis 7 tablet 0  . metFORMIN (GLUCOPHAGE) 500 MG tablet Take 1 tablet (500 mg total) by mouth daily with breakfast. For diabetes management 1 tablet 0  . nicotine (NICODERM CQ - DOSED IN MG/24 HOURS) 21 mg/24hr patch Place 1 patch (21 mg total) onto the skin daily. For nicotine addiction 28 patch 0  . omeprazole (PRILOSEC) 20 MG capsule Take 1 capsule (20 mg total) by mouth daily. For acid reflux 30 capsule 2  . pregabalin (LYRICA) 50 MG capsule Take 1 capsule (50 mg total) by mouth 3 (three) times daily. For fibromyalgia 90 capsule 0  . QUEtiapine (SEROQUEL XR) 50 MG TB24 24 hr tablet Take 1 tablet (50 mg total) by mouth daily. For mood control 30 each 0  . QUEtiapine (SEROQUEL) 400 MG tablet Take 1 tablet (400 mg total) by mouth at bedtime. For mood control 30 tablet 0  . verapamil (  CALAN) 40 MG tablet Take 1 tablet (40 mg total) by mouth 2 (two) times daily. Please stop the [facility] when you starts this medicine: For high blood pressure 60 tablet 3    Musculoskeletal: Unable to assess, camera  Psychiatric Specialty Exam: Physical Exam  Review of Systems  Psychiatric/Behavioral: Positive for substance abuse and suicidal ideas. Negative for depression, hallucinations and memory loss. The patient has insomnia. The patient is not nervous/anxious.   All other systems reviewed and are negative.   Blood pressure 133/79, pulse 95, temperature 97.5 F (36.4 C), temperature source Axillary, resp. rate 20, SpO2 97 %.There is no height or weight on file to calculate BMI.  General Appearance: Disheveled  Eye Contact:  Fair  Speech:  Clear and Coherent and Normal Rate  Volume:  Normal  Mood:  Anxious and Depressed  Affect:   Appropriate and Congruent  Thought Process:  Coherent  Orientation:  Full (Time, Place, and Person)  Thought Content:  Logical  Suicidal Thoughts:  No  Homicidal Thoughts:  No  Memory:  Immediate;   Good Recent;   Good Remote;   Fair  Judgement:  Fair  Insight:  Fair  Psychomotor Activity:  Normal  Concentration:  Concentration: Fair and Attention Span: Fair  Recall:  Good  Fund of Knowledge:  Fair  Language:  Good  Akathisia:  No  Handed:  Right  AIMS (if indicated):     Assets:  Communication Skills Social Support  ADL's:  Intact  Cognition:  WNL  Sleep:   good     Treatment Plan Summary:  Discharge home Keep all follow up appointments [place] for follow up appointment for help with substance abuse. Pt is currently involved with [facility].   Disposition: No evidence of imminent risk to self or others at present.   Patient does not meet criteria for psychiatric inpatient admission. Discussed crisis plan, support from social network, calling 911, coming to the Emergency Department, and calling Suicide Hotline.   Discussed case with Dr Parke Poisson who agrees with plan. There are no grounds to hold patient in the hospital.   Ethelene Hal, NP 10/24/2015 9:44 AM

## 2015-10-24 NOTE — ED Notes (Signed)
Pt noted w/rash to bil flank, lower back, thighs, and lower legs. No drainage noted. States painful at times. States was staying in a home prior to coming to ED for 1.5 days and was advised they had bed bugs. States sat in chair in the home. Pt also noted w/lesion to left 2nd toe. Dr Lacinda Axon assessed pt and new orders received.
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
## 2015-10-24 NOTE — ED Notes (Signed)
Per [place], [place] - pt has been accepted. May arrive tomorrow (10/24/15) after 1000. Accepting Dr Tressa Busman, MD. Pt will need to be IVC'd. Fax IVC papers to [phone] and call report to [phone].

## 2015-10-24 NOTE — ED Notes (Signed)
Pt requesting to ambulate while holding onto w/c d/t wants to sit as needed. Advised pt she may ambulate w/o w/c.

## 2015-10-24 NOTE — ED Notes (Signed)
Dr Lacinda Axon aware [facility] 244 and pt needs home meds ordered.

## 2015-10-24 NOTE — ED Notes (Signed)
Woke pt to administer med. Pt then returned to sleeping.

## 2015-10-24 NOTE — ED Notes (Signed)
Pt woke to take med and asked for RN to call her daughter-in-law at her workplace - [employer] to notify her she is in ED. Pt then returned to sleeping. RN attempted to call - no answer. Phone number written on piece of paper and placed on pt's bedside table.

## 2015-10-24 NOTE — ED Notes (Signed)
IVC paperwork given to Dr Venora Maples to complete.

## 2015-10-24 NOTE — ED Notes (Signed)
TTS being performed.  

## 2015-10-24 NOTE — ED Provider Notes (Signed)
Follow-up from behavioral health consult:   Patient cleared for discharge home. No suicidal or homicidal ideation.   Nat Christen, MD 10/24/15 [phone]

## 2015-10-24 NOTE — ED Notes (Signed)
Pt states she is starting to feel anxious and wants to go to outside to smoke. Advised pt she may ambulate around nurses' desk - d/t unable to go outside and advised her she has a Nicotine Patch. RN allowed pt to vent concerns/feelings. Ativan given.

## 2015-10-24 NOTE — ED Notes (Signed)
Telepsych being performed. 

## 2015-10-24 NOTE — Discharge Instructions (Signed)
Follow-up with community mental health.

## 2015-10-25 NOTE — ED Notes (Signed)
Patient stated that she was uncomfortable in the bed.  I assisted her in adjusting the bed and offered more blankets. Finally patient was satisfied.

## 2015-10-25 NOTE — ED Provider Notes (Signed)
  Physical Exam  BP 117/60 (BP Location: Right Arm)   Pulse 94   Temp 98.6 F (37 C) (Oral)   Resp 18   SpO2 98%   Physical Exam  ED Course  Procedures  MDM Patient except for transfer to [facility] by DR Creta Levin, MD 10/25/15 [phone]

## 2015-10-25 NOTE — Progress Notes (Signed)
Previous placement note states pt accepted to [facility]- correction: pt accepted to [facility] in [place] by Dr. Doyle Askew, report number [phone], and she can arrive 10am.    Sharren Bridge, MSW, LCSW Clinical Social Work, Disposition  10/25/2015 [phone]

## 2015-10-25 NOTE — ED Notes (Signed)
Patient was in deep sleep when I attempted to take patients vital signs.  Patient refused to wake up.

## 2015-12-16 ENCOUNTER — Encounter (INDEPENDENT_AMBULATORY_CARE_PROVIDER_SITE_OTHER): Payer: Self-pay

## 2015-12-16 ENCOUNTER — Encounter: Payer: Self-pay | Admitting: Internal Medicine

## 2015-12-16 ENCOUNTER — Ambulatory Visit (INDEPENDENT_AMBULATORY_CARE_PROVIDER_SITE_OTHER): Payer: Medicare Other | Admitting: Internal Medicine

## 2015-12-16 VITALS — BP 120/70 | HR 76 | Ht 63.78 in | Wt 187.5 lb

## 2015-12-16 DIAGNOSIS — K5909 Other constipation: Secondary | ICD-10-CM

## 2015-12-16 DIAGNOSIS — Z86010 Personal history of colonic polyps: Secondary | ICD-10-CM | POA: Diagnosis not present

## 2015-12-16 DIAGNOSIS — G8929 Other chronic pain: Secondary | ICD-10-CM

## 2015-12-16 DIAGNOSIS — R1084 Generalized abdominal pain: Secondary | ICD-10-CM

## 2015-12-16 DIAGNOSIS — A048 Other specified bacterial intestinal infections: Secondary | ICD-10-CM | POA: Diagnosis not present

## 2015-12-16 MED ORDER — LUBIPROSTONE 24 MCG PO CAPS: 24.0000 ug | ORAL_CAPSULE | Freq: Two times a day (BID) | ORAL | 2 refills | Status: DC
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
## 2015-12-16 NOTE — Patient Instructions (Addendum)
   You have been scheduled for an endoscopy and colonoscopy. Please follow the written instructions given to you at your visit today. Please pick up your prep supplies at the pharmacy. If you use inhalers (even only as needed), please bring them with you on the day of your procedure.  Hold your phentermine 10 days prior to your procedure.   Make sure and do the bowel purge 2 days before your procedure.   Hold your diabetic medicine the AM of your procedure.    We have sent the following medications to your pharmacy for you to pick up at your convenience: Amitza   I appreciate the opportunity to care for you. Silvano Rusk, MD, [facility]

## 2015-12-21 ENCOUNTER — Encounter: Payer: Self-pay | Admitting: Internal Medicine

## 2016-01-12 ENCOUNTER — Ambulatory Visit (INDEPENDENT_AMBULATORY_CARE_PROVIDER_SITE_OTHER): Payer: Self-pay | Admitting: Podiatry

## 2016-01-12 NOTE — Progress Notes (Signed)
Erroneous encounter no show  

## 2016-02-03 ENCOUNTER — Encounter: Payer: Self-pay | Admitting: Internal Medicine

## 2016-02-03 ENCOUNTER — Ambulatory Visit (AMBULATORY_SURGERY_CENTER): Payer: Medicare Other | Admitting: Internal Medicine

## 2016-02-03 VITALS — BP 156/80 | HR 67 | Temp 97.5°F | Resp 12 | Ht 63.0 in | Wt 187.0 lb

## 2016-02-03 DIAGNOSIS — Z8619 Personal history of other infectious and parasitic diseases: Secondary | ICD-10-CM

## 2016-02-03 DIAGNOSIS — Z86010 Personal history of colonic polyps: Secondary | ICD-10-CM

## 2016-02-03 DIAGNOSIS — D124 Benign neoplasm of descending colon: Secondary | ICD-10-CM

## 2016-02-03 DIAGNOSIS — R1084 Generalized abdominal pain: Secondary | ICD-10-CM | POA: Diagnosis not present

## 2016-02-03 DIAGNOSIS — D128 Benign neoplasm of rectum: Secondary | ICD-10-CM | POA: Diagnosis not present

## 2016-02-03 LAB — GLUCOSE, CAPILLARY
GLUCOSE-CAPILLARY: 106 mg/dL — AB (ref 65–99)
GLUCOSE-CAPILLARY: 83 mg/dL (ref 65–99)

## 2016-02-03 MED ORDER — SODIUM CHLORIDE 0.9 % IV SOLN: 500.0000 mL | INTRAVENOUS | Status: DC

## 2016-02-03 NOTE — Patient Instructions (Addendum)
I checked on the H. Pylori and will let you know results. I removed one tiny colon polyp that looks benign.  I will let you know pathology results and when to have another routine colonoscopy by mail.  I appreciate the opportunity to care for you. Gatha Mayer, MD, [facility]   Discharge instructions given. Handout on polyps. Resume previous medications. YOU HAD AN ENDOSCOPIC PROCEDURE TODAY AT [facility]:   Refer to the procedure report that was given to you for any specific questions about what was found during the examination.  If the procedure report does not answer your questions, please call your gastroenterologist to clarify.  If you requested that your care partner not be given the details of your procedure findings, then the procedure report has been included in a sealed envelope for you to review at your convenience later.  YOU SHOULD EXPECT: Some feelings of bloating in the abdomen. Passage of more gas than usual.  Walking can help get rid of the air that was put into your GI tract during the procedure and reduce the bloating. If you had a lower endoscopy (such as a colonoscopy or flexible sigmoidoscopy) you may notice spotting of blood in your stool or on the toilet paper. If you underwent a bowel prep for your procedure, you may not have a normal bowel movement for a few days.  Please Note:  You might notice some irritation and congestion in your nose or some drainage.  This is from the oxygen used during your procedure.  There is no need for concern and it should clear up in a day or so.  SYMPTOMS TO REPORT IMMEDIATELY:   Following lower endoscopy (colonoscopy or flexible sigmoidoscopy):  Excessive amounts of blood in the stool  Significant tenderness or worsening of abdominal pains  Swelling of the abdomen that is new, acute  Fever of 100F or higher   Following upper endoscopy (EGD)  Vomiting of blood or coffee ground material  New chest pain or  pain under the shoulder blades  Painful or persistently difficult swallowing  New shortness of breath  Fever of 100F or higher  Black, tarry-looking stools  For urgent or emergent issues, a gastroenterologist can be reached at any hour by calling [phone].   DIET:  We do recommend a small meal at first, but then you may proceed to your regular diet.  Drink plenty of fluids but you should avoid alcoholic beverages for 24 hours.  ACTIVITY:  You should plan to take it easy for the rest of today and you should NOT DRIVE or use heavy machinery until tomorrow (because of the sedation medicines used during the test).    FOLLOW UP: Our staff will call the number listed on your records the next business day following your procedure to check on you and address any questions or concerns that you may have regarding the information given to you following your procedure. If we do not reach you, we will leave a message.  However, if you are feeling well and you are not experiencing any problems, there is no need to return our call.  We will assume that you have returned to your regular daily activities without incident.  If any biopsies were taken you will be contacted by phone or by letter within the next 1-3 weeks.  Please call us at [phone] if you have not heard about the biopsies in 3 weeks.    SIGNATURES/CONFIDENTIALITY: You and/or your care  partner have signed paperwork which will be entered into your electronic medical record.  These signatures attest to the fact that that the information above on your After Visit Summary has been reviewed and is understood.  Full responsibility of the confidentiality of this discharge information lies with you and/or your care-partner.

## 2016-02-03 NOTE — Op Note (Signed)
[place] Patient Name: Melanie Foster Procedure Date: 02/03/2016 1:56 PM MRN: [id number] Endoscopist: Gatha Mayer , MD Age: 61 Referring MD:  Date of Birth: [birth year] Gender: Female Account #: [id number] Procedure:                Colonoscopy Indications:              High risk colon cancer surveillance: Personal                            history of colonic polyps Medicines:                Propofol per Anesthesia, Monitored Anesthesia Care Procedure:                Pre-Anesthesia Assessment:                           - Prior to the procedure, a History and Physical                            was performed, and patient medications and                            allergies were reviewed. The patient's tolerance of                            previous anesthesia was also reviewed. The risks                            and benefits of the procedure and the sedation                            options and risks were discussed with the patient.                            All questions were answered, and informed consent                            was obtained. Prior Anticoagulants: The patient                            last took aspirin 6 days prior to the procedure.                            ASA Grade Assessment: III - A patient with severe                            systemic disease. After reviewing the risks and                            benefits, the patient was deemed in satisfactory                            condition to undergo the procedure.  After obtaining informed consent, the colonoscope                            was passed under direct vision. Throughout the                            procedure, the patient's blood pressure, pulse, and                            oxygen saturations were monitored continuously. The                            Model CF-HQ190L [id number]) scope was introduced                            through the anus and  advanced to the the cecum,                            identified by appendiceal orifice and ileocecal                            valve. The colonoscopy was somewhat difficult due                            to significant looping. Successful completion of                            the procedure was aided by using manual pressure.                            The ileocecal valve, appendiceal orifice, and                            rectum were photographed. The quality of the bowel                            preparation was adequate. The bowel preparation                            used was Miralax. Scope In: 2:08:22 PM Scope Out: 2:27:51 PM Scope Withdrawal Time: 0 hours 12 minutes 38 seconds  Total Procedure Duration: 0 hours 19 minutes 29 seconds  Findings:                 The perianal and digital rectal examinations were                            normal.                           A 5 mm polyp was found in the descending colon. The                            polyp was sessile. The polyp was removed with a  cold snare. Resection and retrieval were complete.                            Verification of patient identification for the                            specimen was done. Estimated blood loss was minimal.                           The exam was otherwise without abnormality on                            direct and retroflexion views. Complications:            No immediate complications. Estimated Blood Loss:     Estimated blood loss was minimal. Impression:               - One 5 mm polyp in the descending colon, removed                            with a cold snare. Resected and retrieved.                           - The examination was otherwise normal on direct                            and retroflexion views. Recommendation:           - Patient has a contact number available for                            emergencies. The signs and symptoms of potential                             delayed complications were discussed with the                            patient. Return to normal activities tomorrow.                            Written discharge instructions were provided to the                            patient.                           - Continue present medications.                           - Repeat colonoscopy is recommended for                            surveillance. The colonoscopy date will be                            determined after pathology results from today's  exam become available for review.                           - Resume previous diet. Gatha Mayer, MD 02/03/2016 2:40:25 PM This report has been signed electronically.

## 2016-02-03 NOTE — Op Note (Signed)
[place] Patient Name: Melanie Foster Procedure Date: 02/03/2016 1:57 PM MRN: [id number] Endoscopist: Gatha Mayer , MD Age: 61 Referring MD:  Date of Birth: February 22, 1955 Gender: Female Account #: [id number] Procedure:                Upper GI endoscopy Indications:              Generalized abdominal pain, Previously treated for                            Helicobacter pylori Medicines:                Propofol per Anesthesia, Monitored Anesthesia Care Procedure:                Pre-Anesthesia Assessment:                           - Prior to the procedure, a History and Physical                            was performed, and patient medications and                            allergies were reviewed. The patient's tolerance of                            previous anesthesia was also reviewed. The risks                            and benefits of the procedure and the sedation                            options and risks were discussed with the patient.                            All questions were answered, and informed consent                            was obtained. Prior Anticoagulants: The patient                            last took aspirin 6 days prior to the procedure.                            ASA Grade Assessment: III - A patient with severe                            systemic disease. After reviewing the risks and                            benefits, the patient was deemed in satisfactory                            condition to undergo the procedure.  After obtaining informed consent, the endoscope was                            passed under direct vision. Throughout the                            procedure, the patient's blood pressure, pulse, and                            oxygen saturations were monitored continuously. The                            Model GIF-HQ190 [id number]) scope was introduced                            through the mouth, and  advanced to the second part                            of duodenum. The upper GI endoscopy was                            accomplished without difficulty. The patient                            tolerated the procedure well. Scope In: Scope Out: Findings:                 Diffuse mildly erythematous mucosa was found in the                            gastric antrum.                           The exam was otherwise without abnormality.                           The cardia and gastric fundus were normal on                            retroflexion.                           Biopsies were taken with a cold forceps in the                            gastric body and in the gastric antrum for                            Helicobacter pylori testing using CLOtest.                            Verification of patient identification for the                            specimen was done. Estimated blood loss was minimal. Complications:  No immediate complications. Estimated Blood Loss:     Estimated blood loss was minimal. Impression:               - Erythematous mucosa in the antrum.                           - The examination was otherwise normal.                           - Biopsies were taken with a cold forceps for                            Helicobacter pylori testing using CLOtest. Recommendation:           - Patient has a contact number available for                            emergencies. The signs and symptoms of potential                            delayed complications were discussed with the                            patient. Return to normal activities tomorrow.                            Written discharge instructions were provided to the                            patient.                           - Resume previous diet.                           - Continue present medications.                           - Await pathology results.                           - See the other  procedure note for documentation of                            additional recommendations. Gatha Mayer, MD 02/03/2016 2:37:42 PM This report has been signed electronically.

## 2016-02-03 NOTE — Progress Notes (Signed)
Report to PACU, RN, vss, BBS= Clear.  

## 2016-02-04 ENCOUNTER — Telehealth: Payer: Self-pay | Admitting: Internal Medicine

## 2016-02-04 ENCOUNTER — Telehealth: Payer: Self-pay

## 2016-02-04 ENCOUNTER — Other Ambulatory Visit (INDEPENDENT_AMBULATORY_CARE_PROVIDER_SITE_OTHER): Payer: Medicare Other

## 2016-02-04 DIAGNOSIS — D509 Iron deficiency anemia, unspecified: Secondary | ICD-10-CM

## 2016-02-04 LAB — HELICOBACTER PYLORI SCREEN-BIOPSY: UREASE: POSITIVE — AB

## 2016-02-04 NOTE — Telephone Encounter (Signed)
  Follow up Call-  Call back number 02/03/2016  Post procedure Call Back phone  # [phone]  Permission to leave phone message Yes    Patient was called for follow up after her procedure on 02/03/2016. Patient states that she was experiencing pain in the RUQ of her abdomen. I asked her if it was the same pain that she experienced prior to the procedure and she replied that this was different and she only had this pain after her procedure. The patient puts her pain level at an 8 out of 10. While we were talking the patient was driving in the car. I suggested that the patient go to the ER as an 8 out of 10 was significant. The patient refused to go to the ER. Dr. Henrene Pastor was the physician on call and he was notified regarding the patient. Dr. Henrene Pastor advised the patient to go to the ER. Patient was contacted a second time and the patient states that she will try to get a ride to the ER.   Riki Sheer, LPN

## 2016-02-04 NOTE — Telephone Encounter (Signed)
Left message for patient to call back  

## 2016-02-04 NOTE — Telephone Encounter (Signed)
No name or number identifier. Left voicemail will call back later today.

## 2016-02-08 ENCOUNTER — Encounter: Payer: Self-pay | Admitting: Internal Medicine

## 2016-02-08 DIAGNOSIS — B9681 Helicobacter pylori [H. pylori] as the cause of diseases classified elsewhere: Secondary | ICD-10-CM

## 2016-02-08 DIAGNOSIS — K2970 Gastritis, unspecified, without bleeding: Secondary | ICD-10-CM

## 2016-02-08 HISTORY — DX: Helicobacter pylori (H. pylori) as the cause of diseases classified elsewhere: B96.81

## 2016-02-08 NOTE — Progress Notes (Signed)
Please let her know she still has H pylori Colon polyp was not cancer - recall 5 yrs - [facility] notified to enter Need to treat H pylori  1) Omeprazole 20 mg 2 times a day x 14 d 2) Pepto Bismol 2 tabs (262 mg each) 4 times a day x 14 d 3) Metronidazole 250 mg 4 times a day x 14 d 4) doxycycline 100 mg 2 times a day x 14 d   RTC to see me late March or April

## 2016-02-09 ENCOUNTER — Other Ambulatory Visit: Payer: Self-pay

## 2016-02-09 ENCOUNTER — Encounter: Payer: Self-pay | Admitting: Internal Medicine

## 2016-02-09 MED ORDER — BISMUTH SUBSALICYLATE 262 MG PO TABS: 2.0000 | ORAL_TABLET | Freq: Four times a day (QID) | ORAL | 0 refills | Status: DC

## 2016-02-09 MED ORDER — METRONIDAZOLE 250 MG PO TABS: 250.0000 mg | ORAL_TABLET | Freq: Four times a day (QID) | ORAL | 0 refills | Status: DC

## 2016-02-09 MED ORDER — OMEPRAZOLE 20 MG PO CPDR: 20.0000 mg | DELAYED_RELEASE_CAPSULE | Freq: Two times a day (BID) | ORAL | 0 refills | Status: DC

## 2016-02-09 MED ORDER — DOXYCYCLINE HYCLATE 100 MG PO CAPS: 100.0000 mg | ORAL_CAPSULE | Freq: Two times a day (BID) | ORAL | 0 refills | Status: DC

## 2016-02-09 NOTE — Telephone Encounter (Signed)
See results notes for details.  

## 2016-03-23 ENCOUNTER — Emergency Department (HOSPITAL_COMMUNITY)
Admission: EM | Admit: 2016-03-23 | Discharge: 2016-03-23 | Disposition: A | Discharge status: Other Acute Inpatient Hospital | Payer: Medicare Other | Attending: Emergency Medicine | Admitting: Emergency Medicine

## 2016-03-23 ENCOUNTER — Emergency Department (HOSPITAL_COMMUNITY): Payer: Medicare Other

## 2016-03-23 ENCOUNTER — Encounter (HOSPITAL_COMMUNITY): Payer: Self-pay | Admitting: Emergency Medicine

## 2016-03-23 DIAGNOSIS — Z79899 Other long term (current) drug therapy: Secondary | ICD-10-CM | POA: Diagnosis not present

## 2016-03-23 DIAGNOSIS — R45851 Suicidal ideations: Secondary | ICD-10-CM | POA: Diagnosis present

## 2016-03-23 DIAGNOSIS — F17210 Nicotine dependence, cigarettes, uncomplicated: Secondary | ICD-10-CM | POA: Insufficient documentation

## 2016-03-23 DIAGNOSIS — F1910 Other psychoactive substance abuse, uncomplicated: Secondary | ICD-10-CM

## 2016-03-23 DIAGNOSIS — Z7984 Long term (current) use of oral hypoglycemic drugs: Secondary | ICD-10-CM | POA: Diagnosis not present

## 2016-03-23 DIAGNOSIS — F3163 Bipolar disorder, current episode mixed, severe, without psychotic features: Secondary | ICD-10-CM | POA: Diagnosis present

## 2016-03-23 DIAGNOSIS — E1140 Type 2 diabetes mellitus with diabetic neuropathy, unspecified: Secondary | ICD-10-CM | POA: Insufficient documentation

## 2016-03-23 DIAGNOSIS — I10 Essential (primary) hypertension: Secondary | ICD-10-CM | POA: Insufficient documentation

## 2016-03-23 DIAGNOSIS — E039 Hypothyroidism, unspecified: Secondary | ICD-10-CM | POA: Diagnosis not present

## 2016-03-23 DIAGNOSIS — Z7982 Long term (current) use of aspirin: Secondary | ICD-10-CM | POA: Insufficient documentation

## 2016-03-23 LAB — URINALYSIS, ROUTINE W REFLEX MICROSCOPIC
Glucose, UA: NEGATIVE mg/dL
Hgb urine dipstick: NEGATIVE
Ketones, ur: 20 mg/dL — AB
Leukocytes, UA: NEGATIVE
Nitrite: NEGATIVE
PROTEIN: 30 mg/dL — AB
Specific Gravity, Urine: 1.041 — ABNORMAL HIGH (ref 1.005–1.030)
pH: 5 (ref 5.0–8.0)

## 2016-03-23 LAB — CBC
HEMATOCRIT: 43.6 % (ref 36.0–46.0)
Hemoglobin: 14.6 g/dL (ref 12.0–15.0)
MCH: 28.1 pg (ref 26.0–34.0)
MCHC: 33.5 g/dL (ref 30.0–36.0)
MCV: 84 fL (ref 78.0–100.0)
Platelets: 414 10*3/uL — ABNORMAL HIGH (ref 150–400)
RBC: 5.19 MIL/uL — ABNORMAL HIGH (ref 3.87–5.11)
RDW: 15.6 % — AB (ref 11.5–15.5)
WBC: 11.5 10*3/uL — ABNORMAL HIGH (ref 4.0–10.5)

## 2016-03-23 LAB — I-STAT TROPONIN, ED
TROPONIN I, POC: 0 ng/mL (ref 0.00–0.08)
Troponin i, poc: 0.01 ng/mL (ref 0.00–0.08)

## 2016-03-23 LAB — COMPREHENSIVE METABOLIC PANEL
ALT: 22 U/L (ref 14–54)
AST: 25 U/L (ref 15–41)
Albumin: 4.4 g/dL (ref 3.5–5.0)
Alkaline Phosphatase: 85 U/L (ref 38–126)
Anion gap: 10 (ref 5–15)
BUN: 12 mg/dL (ref 6–20)
CALCIUM: 10.2 mg/dL (ref 8.9–10.3)
CHLORIDE: 105 mmol/L (ref 101–111)
CO2: 26 mmol/L (ref 22–32)
CREATININE: 0.78 mg/dL (ref 0.44–1.00)
Glucose, Bld: 149 mg/dL — ABNORMAL HIGH (ref 65–99)
Potassium: 3.4 mmol/L — ABNORMAL LOW (ref 3.5–5.1)
Sodium: 141 mmol/L (ref 135–145)
Total Bilirubin: 0.9 mg/dL (ref 0.3–1.2)
Total Protein: 8.3 g/dL — ABNORMAL HIGH (ref 6.5–8.1)

## 2016-03-23 LAB — ETHANOL

## 2016-03-23 LAB — RAPID URINE DRUG SCREEN, HOSP PERFORMED
Amphetamines: NOT DETECTED
BARBITURATES: NOT DETECTED
Benzodiazepines: POSITIVE — AB
COCAINE: POSITIVE — AB
Opiates: NOT DETECTED
Tetrahydrocannabinol: NOT DETECTED

## 2016-03-23 LAB — ACETAMINOPHEN LEVEL: Acetaminophen (Tylenol), Serum: <10 ug/mL — ABNORMAL LOW (ref 10–30)

## 2016-03-23 LAB — SALICYLATE LEVEL

## 2016-03-23 MED ORDER — ACETAMINOPHEN 325 MG PO TABS: 650.0000 mg | ORAL_TABLET | ORAL | Status: DC | PRN

## 2016-03-23 MED ORDER — ACETAMINOPHEN 500 MG PO TABS
1000.0000 mg | ORAL_TABLET | Freq: Once | ORAL | Status: AC
  Administered 2016-03-23: 1000 mg via ORAL
  Filled 2016-03-23: qty 2

## 2016-03-23 MED ORDER — GABAPENTIN 300 MG PO CAPS
300.0000 mg | ORAL_CAPSULE | Freq: Three times a day (TID) | ORAL | Status: DC
  Administered 2016-03-23: 300 mg via ORAL
  Filled 2016-03-23: qty 1

## 2016-03-23 MED ORDER — NICOTINE 21 MG/24HR TD PT24: 21.0000 mg | MEDICATED_PATCH | Freq: Every day | TRANSDERMAL | Status: DC

## 2016-03-23 MED ORDER — ONDANSETRON HCL 4 MG PO TABS: 4.0000 mg | ORAL_TABLET | Freq: Three times a day (TID) | ORAL | Status: DC | PRN

## 2016-03-23 MED ORDER — ALUM & MAG HYDROXIDE-SIMETH 200-200-20 MG/5ML PO SUSP: 30.0000 mL | ORAL | Status: DC | PRN

## 2016-03-23 MED ORDER — NAPROXEN 500 MG PO TABS
500.0000 mg | ORAL_TABLET | Freq: Once | ORAL | Status: AC
  Administered 2016-03-23: 500 mg via ORAL
  Filled 2016-03-23: qty 1

## 2016-03-23 MED ORDER — IBUPROFEN 200 MG PO TABS: 600.0000 mg | ORAL_TABLET | Freq: Three times a day (TID) | ORAL | Status: DC | PRN

## 2016-03-23 MED ORDER — IBUPROFEN 800 MG PO TABS
800.0000 mg | ORAL_TABLET | Freq: Three times a day (TID) | ORAL | Status: DC | PRN
  Administered 2016-03-23: 800 mg via ORAL
  Filled 2016-03-23: qty 1

## 2016-03-23 MED ORDER — ZOLPIDEM TARTRATE 5 MG PO TABS: 5.0000 mg | ORAL_TABLET | Freq: Every evening | ORAL | Status: DC | PRN

## 2016-03-23 NOTE — Progress Notes (Signed)
03/23/16 1352:  LRT went to pt room to offer activities, pt was sleep.  Victorino Sparrow, LRT/CTRS

## 2016-03-23 NOTE — ED Notes (Signed)
Pt sated she is unable to urinate
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
## 2016-03-23 NOTE — ED Notes (Signed)
Pt oriented to room and unit.  Pt is withdrawn.  She stated she was S/I but refused to state whether or not she had a plan.  Meal tray and extra blankets were given and patient was assured of her safety.  15 minute checks and video monitoring in place.

## 2016-03-23 NOTE — BH Assessment (Signed)
[facility] Assessment Progress Note  Per Corena Pilgrim, MD, this pt requires psychiatric hospitalization at this time.  At 13:59 Roderic Palau calls from [facility].  Pt has been accepted to their facility by Dr Dareen Piano.  Waylan Boga, DNP, concurs with this disposition, as does the pt who is currently under voluntary status.  Pt's nurse, Nena Jordan, has been notified, and agrees to call report to [phone].  Pt is to be transported via Stacey Drain, [place] Triage Specialist [phone]
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
## 2016-03-23 NOTE — ED Notes (Signed)
Pt was encouraged to get up and give urine sample.  Pt complains of soreness and is very slow moving.

## 2016-04-06 ENCOUNTER — Ambulatory Visit: Payer: Self-pay | Admitting: Internal Medicine

## 2016-04-25 ENCOUNTER — Encounter (HOSPITAL_COMMUNITY): Payer: Self-pay | Admitting: Emergency Medicine

## 2016-04-25 ENCOUNTER — Emergency Department (HOSPITAL_COMMUNITY): Payer: Medicare Other

## 2016-04-25 ENCOUNTER — Observation Stay (HOSPITAL_COMMUNITY)
Admission: EM | Admit: 2016-04-25 | Discharge: 2016-04-27 | Disposition: A | Discharge status: Home / Self Care | Payer: Medicare Other | Attending: Internal Medicine | Admitting: Internal Medicine

## 2016-04-25 DIAGNOSIS — Z7982 Long term (current) use of aspirin: Secondary | ICD-10-CM | POA: Diagnosis not present

## 2016-04-25 DIAGNOSIS — Z833 Family history of diabetes mellitus: Secondary | ICD-10-CM | POA: Diagnosis not present

## 2016-04-25 DIAGNOSIS — Z7984 Long term (current) use of oral hypoglycemic drugs: Secondary | ICD-10-CM | POA: Insufficient documentation

## 2016-04-25 DIAGNOSIS — F419 Anxiety disorder, unspecified: Secondary | ICD-10-CM | POA: Diagnosis not present

## 2016-04-25 DIAGNOSIS — Z885 Allergy status to narcotic agent status: Secondary | ICD-10-CM | POA: Insufficient documentation

## 2016-04-25 DIAGNOSIS — I2699 Other pulmonary embolism without acute cor pulmonale: Secondary | ICD-10-CM | POA: Diagnosis present

## 2016-04-25 DIAGNOSIS — E7800 Pure hypercholesterolemia, unspecified: Secondary | ICD-10-CM | POA: Diagnosis not present

## 2016-04-25 DIAGNOSIS — F3163 Bipolar disorder, current episode mixed, severe, without psychotic features: Secondary | ICD-10-CM | POA: Diagnosis not present

## 2016-04-25 DIAGNOSIS — Z8249 Family history of ischemic heart disease and other diseases of the circulatory system: Secondary | ICD-10-CM | POA: Diagnosis not present

## 2016-04-25 DIAGNOSIS — I10 Essential (primary) hypertension: Secondary | ICD-10-CM | POA: Insufficient documentation

## 2016-04-25 DIAGNOSIS — E785 Hyperlipidemia, unspecified: Secondary | ICD-10-CM | POA: Diagnosis not present

## 2016-04-25 DIAGNOSIS — E039 Hypothyroidism, unspecified: Secondary | ICD-10-CM | POA: Diagnosis not present

## 2016-04-25 DIAGNOSIS — E1165 Type 2 diabetes mellitus with hyperglycemia: Secondary | ICD-10-CM | POA: Insufficient documentation

## 2016-04-25 DIAGNOSIS — Z79899 Other long term (current) drug therapy: Secondary | ICD-10-CM | POA: Insufficient documentation

## 2016-04-25 DIAGNOSIS — K219 Gastro-esophageal reflux disease without esophagitis: Secondary | ICD-10-CM | POA: Diagnosis not present

## 2016-04-25 DIAGNOSIS — Z86010 Personal history of colonic polyps: Secondary | ICD-10-CM | POA: Insufficient documentation

## 2016-04-25 DIAGNOSIS — F17210 Nicotine dependence, cigarettes, uncomplicated: Secondary | ICD-10-CM | POA: Diagnosis not present

## 2016-04-25 DIAGNOSIS — F1420 Cocaine dependence, uncomplicated: Secondary | ICD-10-CM | POA: Diagnosis not present

## 2016-04-25 DIAGNOSIS — E1140 Type 2 diabetes mellitus with diabetic neuropathy, unspecified: Secondary | ICD-10-CM | POA: Diagnosis not present

## 2016-04-25 DIAGNOSIS — Z720 Tobacco use: Secondary | ICD-10-CM | POA: Diagnosis present

## 2016-04-25 DIAGNOSIS — M8580 Other specified disorders of bone density and structure, unspecified site: Secondary | ICD-10-CM | POA: Diagnosis not present

## 2016-04-25 DIAGNOSIS — G8929 Other chronic pain: Secondary | ICD-10-CM | POA: Diagnosis present

## 2016-04-25 DIAGNOSIS — F1920 Other psychoactive substance dependence, uncomplicated: Secondary | ICD-10-CM | POA: Diagnosis present

## 2016-04-25 DIAGNOSIS — M549 Dorsalgia, unspecified: Secondary | ICD-10-CM | POA: Diagnosis not present

## 2016-04-25 DIAGNOSIS — R8290 Unspecified abnormal findings in urine: Secondary | ICD-10-CM | POA: Diagnosis present

## 2016-04-25 DIAGNOSIS — E118 Type 2 diabetes mellitus with unspecified complications: Secondary | ICD-10-CM

## 2016-04-25 DIAGNOSIS — R5383 Other fatigue: Secondary | ICD-10-CM

## 2016-04-25 LAB — CBC WITH DIFFERENTIAL/PLATELET
Basophils Absolute: 0 10*3/uL (ref 0.0–0.1)
Basophils Relative: 0 %
EOS ABS: 0.1 10*3/uL (ref 0.0–0.7)
Eosinophils Relative: 1 %
HCT: 40.4 % (ref 36.0–46.0)
HEMOGLOBIN: 13.1 g/dL (ref 12.0–15.0)
Lymphocytes Relative: 34 %
Lymphs Abs: 3.5 10*3/uL (ref 0.7–4.0)
MCH: 28.1 pg (ref 26.0–34.0)
MCHC: 32.4 g/dL (ref 30.0–36.0)
MCV: 86.7 fL (ref 78.0–100.0)
Monocytes Absolute: 0.9 10*3/uL (ref 0.1–1.0)
Monocytes Relative: 9 %
NEUTROS PCT: 56 %
Neutro Abs: 5.8 10*3/uL (ref 1.7–7.7)
Platelets: 494 10*3/uL — ABNORMAL HIGH (ref 150–400)
RBC: 4.66 MIL/uL (ref 3.87–5.11)
RDW: 15.7 % — ABNORMAL HIGH (ref 11.5–15.5)
WBC: 10.3 10*3/uL (ref 4.0–10.5)

## 2016-04-25 LAB — COMPREHENSIVE METABOLIC PANEL
ALT: 23 U/L (ref 14–54)
ANION GAP: 11 (ref 5–15)
AST: 31 U/L (ref 15–41)
Albumin: 3.5 g/dL (ref 3.5–5.0)
Alkaline Phosphatase: 69 U/L (ref 38–126)
BUN: 6 mg/dL (ref 6–20)
CALCIUM: 9.8 mg/dL (ref 8.9–10.3)
CHLORIDE: 106 mmol/L (ref 101–111)
CO2: 24 mmol/L (ref 22–32)
Creatinine, Ser: 0.88 mg/dL (ref 0.44–1.00)
GFR calc Af Amer: >60 mL/min (ref >60)
Glucose, Bld: 176 mg/dL — ABNORMAL HIGH (ref 65–99)
Potassium: 3.5 mmol/L (ref 3.5–5.1)
Sodium: 141 mmol/L (ref 135–145)
Total Bilirubin: 0.5 mg/dL (ref 0.3–1.2)
Total Protein: 7.2 g/dL (ref 6.5–8.1)

## 2016-04-25 NOTE — ED Notes (Signed)
Pt called for in waiting area. No answer no response. x3.

## 2016-04-25 NOTE — ED Notes (Signed)
Pt called for in waiting room for vital sign reassessment. No answer.

## 2016-04-25 NOTE — ED Triage Notes (Signed)
Patient reports SOB onset yesterday with generalized weakness, fatigue , dizziness and sore throat, denies fever or chills , no cough or congestion .

## 2016-04-25 NOTE — ED Notes (Signed)
Pt called for in waiting area for vital sign reassessment. No answer x2

## 2016-04-26 ENCOUNTER — Observation Stay (HOSPITAL_BASED_OUTPATIENT_CLINIC_OR_DEPARTMENT_OTHER): Payer: Medicare Other

## 2016-04-26 ENCOUNTER — Emergency Department (HOSPITAL_COMMUNITY): Payer: Medicare Other

## 2016-04-26 ENCOUNTER — Encounter (HOSPITAL_COMMUNITY): Payer: Self-pay | Admitting: *Deleted

## 2016-04-26 DIAGNOSIS — F1920 Other psychoactive substance dependence, uncomplicated: Secondary | ICD-10-CM

## 2016-04-26 DIAGNOSIS — F3163 Bipolar disorder, current episode mixed, severe, without psychotic features: Secondary | ICD-10-CM | POA: Diagnosis not present

## 2016-04-26 DIAGNOSIS — I2699 Other pulmonary embolism without acute cor pulmonale: Secondary | ICD-10-CM

## 2016-04-26 DIAGNOSIS — I10 Essential (primary) hypertension: Secondary | ICD-10-CM | POA: Diagnosis not present

## 2016-04-26 DIAGNOSIS — Z720 Tobacco use: Secondary | ICD-10-CM | POA: Diagnosis not present

## 2016-04-26 DIAGNOSIS — Z86711 Personal history of pulmonary embolism: Secondary | ICD-10-CM

## 2016-04-26 DIAGNOSIS — E118 Type 2 diabetes mellitus with unspecified complications: Secondary | ICD-10-CM

## 2016-04-26 DIAGNOSIS — R8290 Unspecified abnormal findings in urine: Secondary | ICD-10-CM | POA: Diagnosis present

## 2016-04-26 LAB — HEMOGLOBIN A1C
Hgb A1c MFr Bld: 6.3 % — ABNORMAL HIGH (ref 4.8–5.6)
Mean Plasma Glucose: 134 mg/dL

## 2016-04-26 LAB — GLUCOSE, CAPILLARY
GLUCOSE-CAPILLARY: 101 mg/dL — AB (ref 65–99)
Glucose-Capillary: 144 mg/dL — ABNORMAL HIGH (ref 65–99)
Glucose-Capillary: 114 mg/dL — ABNORMAL HIGH (ref 65–99)

## 2016-04-26 LAB — MAGNESIUM: Magnesium: 1.8 mg/dL (ref 1.7–2.4)

## 2016-04-26 LAB — ECHOCARDIOGRAM COMPLETE
HEIGHTINCHES: 64 in
WEIGHTICAEL: 2822.4 [oz_av]

## 2016-04-26 LAB — PHOSPHORUS: Phosphorus: 4.6 mg/dL (ref 2.5–4.6)

## 2016-04-26 LAB — D-DIMER, QUANTITATIVE (NOT AT ARMC): D DIMER QUANT: 0.92 ug{FEU}/mL — AB (ref 0.00–0.50)

## 2016-04-26 LAB — BRAIN NATRIURETIC PEPTIDE: B NATRIURETIC PEPTIDE 5: 17.3 pg/mL (ref 0.0–100.0)

## 2016-04-26 MED ORDER — LEVOTHYROXINE SODIUM 25 MCG PO TABS
25.0000 ug | ORAL_TABLET | Freq: Every day | ORAL | Status: DC
  Administered 2016-04-27: 25 ug via ORAL
  Filled 2016-04-26: qty 1

## 2016-04-26 MED ORDER — ACETAMINOPHEN 325 MG PO TABS: 650.0000 mg | ORAL_TABLET | Freq: Four times a day (QID) | ORAL | Status: DC | PRN

## 2016-04-26 MED ORDER — ALPRAZOLAM 0.25 MG PO TABS
1.0000 mg | ORAL_TABLET | Freq: Once | ORAL | Status: AC
  Administered 2016-04-26: 1 mg via ORAL
  Filled 2016-04-26: qty 4

## 2016-04-26 MED ORDER — RIVAROXABAN 20 MG PO TABS: 20.0000 mg | ORAL_TABLET | Freq: Every day | ORAL | Status: DC

## 2016-04-26 MED ORDER — SODIUM CHLORIDE 0.9% FLUSH
3.0000 mL | Freq: Two times a day (BID) | INTRAVENOUS | Status: DC
  Administered 2016-04-26 (×2): 3 mL via INTRAVENOUS

## 2016-04-26 MED ORDER — TRAMADOL HCL 50 MG PO TABS: 50.0000 mg | ORAL_TABLET | Freq: Four times a day (QID) | ORAL | Status: DC | PRN

## 2016-04-26 MED ORDER — PHENTERMINE HCL 37.5 MG PO TABS: 37.5000 mg | ORAL_TABLET | Freq: Every day | ORAL | Status: DC

## 2016-04-26 MED ORDER — ALBUTEROL SULFATE (2.5 MG/3ML) 0.083% IN NEBU
5.0000 mg | INHALATION_SOLUTION | Freq: Once | RESPIRATORY_TRACT | Status: AC
Start: 2016-04-26 — End: 2016-04-26
  Administered 2016-04-26: 5 mg via RESPIRATORY_TRACT
  Filled 2016-04-26: qty 6

## 2016-04-26 MED ORDER — CYCLOBENZAPRINE HCL 10 MG PO TABS: 10.0000 mg | ORAL_TABLET | Freq: Three times a day (TID) | ORAL | Status: DC | PRN

## 2016-04-26 MED ORDER — ALBUTEROL SULFATE (2.5 MG/3ML) 0.083% IN NEBU: 2.5000 mg | INHALATION_SOLUTION | Freq: Four times a day (QID) | RESPIRATORY_TRACT | Status: DC | PRN

## 2016-04-26 MED ORDER — METOCLOPRAMIDE HCL 10 MG PO TABS
10.0000 mg | ORAL_TABLET | Freq: Once | ORAL | Status: AC
  Administered 2016-04-26: 10 mg via ORAL
  Filled 2016-04-26: qty 1

## 2016-04-26 MED ORDER — ZOLPIDEM TARTRATE 5 MG PO TABS
5.0000 mg | ORAL_TABLET | Freq: Every evening | ORAL | Status: DC | PRN
  Administered 2016-04-26: 5 mg via ORAL
  Filled 2016-04-26: qty 1

## 2016-04-26 MED ORDER — IOPAMIDOL (ISOVUE-370) INJECTION 76%
100.0000 mL | Freq: Once | INTRAVENOUS | Status: AC | PRN
  Administered 2016-04-26: 100 mL via INTRAVENOUS

## 2016-04-26 MED ORDER — QUETIAPINE FUMARATE 300 MG PO TABS
300.0000 mg | ORAL_TABLET | Freq: Every day | ORAL | Status: DC
  Administered 2016-04-26: 300 mg via ORAL
  Filled 2016-04-26: qty 1

## 2016-04-26 MED ORDER — ASPIRIN EC 81 MG PO TBEC
81.0000 mg | DELAYED_RELEASE_TABLET | Freq: Every day | ORAL | Status: DC
  Administered 2016-04-26 – 2016-04-27 (×2): 81 mg via ORAL
  Filled 2016-04-26 (×2): qty 1

## 2016-04-26 MED ORDER — NICOTINE 14 MG/24HR TD PT24
14.0000 mg | MEDICATED_PATCH | Freq: Every day | TRANSDERMAL | Status: DC
  Administered 2016-04-26 – 2016-04-27 (×2): 14 mg via TRANSDERMAL
  Filled 2016-04-26 (×2): qty 1

## 2016-04-26 MED ORDER — INSULIN ASPART 100 UNIT/ML ~~LOC~~ SOLN: 0.0000 [IU] | Freq: Every day | SUBCUTANEOUS | Status: DC

## 2016-04-26 MED ORDER — ESCITALOPRAM OXALATE 10 MG PO TABS
20.0000 mg | ORAL_TABLET | Freq: Every day | ORAL | Status: DC
  Administered 2016-04-26: 20 mg via ORAL
  Filled 2016-04-26: qty 2

## 2016-04-26 MED ORDER — SODIUM CHLORIDE 0.9 % IV SOLN
INTRAVENOUS | Status: DC
  Administered 2016-04-26: 11:00:00 via INTRAVENOUS

## 2016-04-26 MED ORDER — RIVAROXABAN 15 MG PO TABS
15.0000 mg | ORAL_TABLET | Freq: Two times a day (BID) | ORAL | Status: DC
  Administered 2016-04-26 – 2016-04-27 (×3): 15 mg via ORAL
  Filled 2016-04-26 (×3): qty 1

## 2016-04-26 MED ORDER — INSULIN ASPART 100 UNIT/ML ~~LOC~~ SOLN: 0.0000 [IU] | Freq: Three times a day (TID) | SUBCUTANEOUS | Status: DC

## 2016-04-26 MED ORDER — METFORMIN HCL 500 MG PO TABS
500.0000 mg | ORAL_TABLET | Freq: Every day | ORAL | Status: DC
  Administered 2016-04-26 – 2016-04-27 (×2): 500 mg via ORAL
  Filled 2016-04-26 (×2): qty 1

## 2016-04-26 MED ORDER — ACETAMINOPHEN 650 MG RE SUPP: 650.0000 mg | Freq: Four times a day (QID) | RECTAL | Status: DC | PRN

## 2016-04-26 MED ORDER — PREGABALIN 25 MG PO CAPS
150.0000 mg | ORAL_CAPSULE | Freq: Three times a day (TID) | ORAL | Status: DC
  Administered 2016-04-26 (×3): 150 mg via ORAL
  Filled 2016-04-26: qty 1
  Filled 2016-04-26 (×2): qty 2

## 2016-04-26 MED ORDER — ACETAMINOPHEN 500 MG PO TABS
1000.0000 mg | ORAL_TABLET | Freq: Once | ORAL | Status: AC
  Administered 2016-04-26: 1000 mg via ORAL
  Filled 2016-04-26: qty 2

## 2016-04-26 MED ORDER — PRAVASTATIN SODIUM 20 MG PO TABS
20.0000 mg | ORAL_TABLET | Freq: Every day | ORAL | Status: DC
  Administered 2016-04-26: 20 mg via ORAL
  Filled 2016-04-26: qty 1

## 2016-04-26 MED ORDER — BREXPIPRAZOLE 2 MG PO TABS
2.0000 mg | ORAL_TABLET | Freq: Every day | ORAL | Status: DC
  Administered 2016-04-26 – 2016-04-27 (×2): 2 mg via ORAL
  Filled 2016-04-26 (×2): qty 1

## 2016-04-26 MED ORDER — ALPRAZOLAM 0.25 MG PO TABS
1.0000 mg | ORAL_TABLET | Freq: Three times a day (TID) | ORAL | Status: DC
  Administered 2016-04-26 (×3): 1 mg via ORAL
  Filled 2016-04-26 (×3): qty 4

## 2016-04-26 MED ORDER — VERAPAMIL HCL 40 MG PO TABS
40.0000 mg | ORAL_TABLET | Freq: Two times a day (BID) | ORAL | Status: DC
  Administered 2016-04-26 – 2016-04-27 (×3): 40 mg via ORAL
  Filled 2016-04-26 (×3): qty 1

## 2016-04-26 MED ORDER — IPRATROPIUM BROMIDE 0.02 % IN SOLN
0.5000 mg | Freq: Once | RESPIRATORY_TRACT | Status: AC
  Administered 2016-04-26: 0.5 mg via RESPIRATORY_TRACT
  Filled 2016-04-26: qty 2.5

## 2016-04-26 NOTE — Progress Notes (Signed)
**  Preliminary report by tech**  Bilateral lower extremity venous duplex completed. There is no evidence of deep or superficial vein thrombosis involving the right and left lower extremities. All visualized vessels appear patent and compressible. There is no evidence of Baker's cysts bilaterally.  04/26/16 2:34 PM Carlos Levering RVT

## 2016-04-26 NOTE — Progress Notes (Signed)
New pt admission from ED. Pt brought to the floor in stable condition. Vitals taken. Initial Assessment done. All immediate pertinent needs to patient addressed. Patient Guide given to patient. Important safety instructions relating to hospitalization reviewed with patient. Patient verbalized understanding. Will continue to monitor pt.  Melanie Darius RN 

## 2016-04-26 NOTE — Progress Notes (Signed)
  Echocardiogram 2D Echocardiogram has been performed.  Melanie Foster 04/26/2016, 2:54 PM

## 2016-04-26 NOTE — Progress Notes (Signed)
On arrival noted patient with some apical pulling to breathe. Pt appears air hungry at this time. SATs are stable. Pt states that she "cant breathe" dyspnea at rest. pt also states that she is a daily smoker. When asked pt denies history of COPD/asthma or any type of pulmonary history or infections in the past. MD made aware of findings.

## 2016-04-26 NOTE — ED Notes (Signed)
Attempted report X1
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
## 2016-04-26 NOTE — Discharge Instructions (Signed)
Information on my medicine - XARELTO (rivaroxaban)  This medication education was reviewed with me or my healthcare representative as part of my discharge preparation.    WHY WAS XARELTO PRESCRIBED FOR YOU? Xarelto was prescribed to treat blood clots that may have been found in the veins of your legs (deep vein thrombosis) or in your lungs (pulmonary embolism) and to reduce the risk of them occurring again.  What do you need to know about Xarelto? The starting dose is one 15 mg tablet taken TWICE daily with food for the FIRST 21 DAYS then on (enter date)  05/17/16  the dose is changed to one 20 mg tablet taken ONCE A DAY with your evening meal.  DO NOT stop taking Xarelto without talking to the health care provider who prescribed the medication.  Refill your prescription for 20 mg tablets before you run out.  After discharge, you should have regular check-up appointments with your healthcare provider that is prescribing your Xarelto.  In the future your dose may need to be changed if your kidney function changes by a significant amount.  What do you do if you miss a dose? If you are taking Xarelto TWICE DAILY and you miss a dose, take it as soon as you remember. You may take two 15 mg tablets (total 30 mg) at the same time then resume your regularly scheduled 15 mg twice daily the next day.  If you are taking Xarelto ONCE DAILY and you miss a dose, take it as soon as you remember on the same day then continue your regularly scheduled once daily regimen the next day. Do not take two doses of Xarelto at the same time.   Important Safety Information Xarelto is a blood thinner medicine that can cause bleeding. You should call your healthcare provider right away if you experience any of the following: ? Bleeding from an injury or your nose that does not stop. ? Unusual colored urine (red or dark brown) or unusual colored stools (red or black). ? Unusual bruising for unknown reasons. ? A  serious fall or if you hit your head (even if there is no bleeding).  Some medicines may interact with Xarelto and might increase your risk of bleeding while on Xarelto. To help avoid this, consult your healthcare provider or pharmacist prior to using any new prescription or non-prescription medications, including herbals, vitamins, non-steroidal anti-inflammatory drugs (NSAIDs) and supplements.  This website has more information on Xarelto: [email or web address]

## 2016-04-26 NOTE — Progress Notes (Signed)
Plan of care discussed with patient including active orders. Patient also had her home medications and cigarettes on her person. The process for the storage of home medications was discussed with the patient. Medication removed and will be taken to the pharmacy by nurse. Cigarettes and lighter were also removed form patient's room and kept with her chart until discharge.

## 2016-04-26 NOTE — Progress Notes (Addendum)
Received verbal orders from Dr. Marily Memos for nicotine patch.

## 2016-04-26 NOTE — ED Provider Notes (Signed)
[facility] DEPT Provider Note   CSN: [id number] Arrival date & time: 04/25/16  2014  By signing my name below, I, Evelene Croon, attest that this documentation has been prepared under the direction and in the presence of Everlene Balls, MD . Electronically Signed: Evelene Croon, Scribe. 04/26/2016. 1:43 AM.   History   Chief Complaint Chief Complaint  Patient presents with  . Shortness of Breath     The history is provided by the patient. No language interpreter was used.     HPI Comments:  Melanie Foster is a 61 y.o. female who presents to the Emergency Department complaining of gradually worsening SOB x 1.5 days. She reports difficulty taking a deep breath. SOB is worse at night. Pt stopped taking her daily meds ~2 days ago after hearing a Lyrica commercial that said Lyrica, which she is currently prescribed, can cause SOB. Pt then stated SOB began prior to stopping her meds. At this time she reports a HA. Pt denies vomiting, diarrhea, and cough. Pt is a current smoker. Pt denies h/o PE/DVT. No recent surgery, hospitalizations or long periods of immobilization.   Past Medical History:  Diagnosis Date  . Anxiety   . Arthritis   . Bipolar 1 disorder ([place])   . Cataract   . Cocaine abuse   . Colon polyps   . Depression   . Diabetes mellitus without complication ([place])   . Drug abuse   . GERD (gastroesophageal reflux disease)   . H. pylori infection   . Helicobacter pylori gastritis 02/08/2016  . Hypercholesteremia   . Hypertension   . Hypothyroid   . Marijuana abuse   . Nerve damage    both legs  . Osteopenia   . Pancreatitis   . Polysubstance abuse   . Suicidal ideation     Patient Active Problem List   Diagnosis Date Noted  . Helicobacter pylori gastritis 02/08/2016  . Bipolar disorder, curr episode mixed, severe, w/o psychotic features ([place]) 09/16/2015  . Cocaine use disorder, severe, dependence ([place]) 09/16/2015  . Cannabis use disorder, mild, abuse 09/16/2015  .  Polysubstance dependence ([place]) 09/15/2015  . Suicidal ideation 07/14/2015  . Hypertension 07/14/2015  . Drug overdose 07/14/2015  . Hypokalemia 07/14/2015  . Cocaine abuse 07/14/2015  . Marijuana abuse 07/14/2015  . Chronic back pain 07/14/2015  . Suicide attempt ([place])   . Herpes simplex 04/28/2015  . Tobacco abuse 04/28/2015  . Chronic bronchitis ([place]) 04/28/2015  . 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 Bipolar 1 disorder, mixed, severe ([place]) 04/22/2015  . Hyperlipidemia 04/22/2015  . Essential hypertension 04/22/2015  . Type 2 diabetes mellitus with hyperglycemia ([place]) 04/22/2015  . Metabolic acidosis 08/22/4816  . Hypothyroidism 04/22/2015    Past Surgical History:  Procedure Laterality Date  . CHOLECYSTECTOMY    . COLONOSCOPY    . KNEE SURGERY Left unknown   surgical scar noted  . PARTIAL HYSTERECTOMY    . ROTATOR CUFF REPAIR Right unknown   per client, surgery scar noted  . SPLENECTOMY Right unknown   per client  . UPPER GASTROINTESTINAL ENDOSCOPY      OB History    No data available       Home Medications    Prior to Admission medications   Medication Sig Start Date End Date Taking? Authorizing Provider  albuterol (PROVENTIL HFA;VENTOLIN HFA) 108 (90 Base) MCG/ACT inhaler Inhale 1-2 puffs into the lungs every 6 (six) hours as needed for wheezing or shortness of breath.  Historical Provider, MD  ALPRAZolam [name]) 1 MG tablet Take 1 mg by mouth 3 (three) times daily.    Historical Provider, MD  aspirin EC 81 MG tablet Take 81 mg by mouth daily.    Historical Provider, MD  Brexpiprazole (REXULTI) 2 MG TABS Take 2 mg by mouth daily.    Historical Provider, MD  cyclobenzaprine (FLEXERIL) 10 MG tablet Take 10 mg by mouth 3 (three) times daily as needed for muscle spasms.     Historical Provider, MD  escitalopram (LEXAPRO) 20 MG tablet Take 20 mg by mouth daily.    Historical Provider, MD  levothyroxine (SYNTHROID, LEVOTHROID) 25 MCG tablet  Take 1 tablet (25 mcg total) by mouth daily before breakfast. For thyroid hormone replacement 09/23/15   Encarnacion Slates, NP  metFORMIN (GLUCOPHAGE) 500 MG tablet Take 1 tablet (500 mg total) by mouth daily with breakfast. For diabetes management 09/23/15   Encarnacion Slates, NP  MYRBETRIQ 25 MG TB24 tablet Take 25 mg by mouth daily.     Historical Provider, MD  omeprazole (PRILOSEC) 20 MG capsule Take 1 capsule (20 mg total) by mouth 2 (two) times daily. 02/09/16   Gatha Mayer, MD  phentermine (ADIPEX-P) 37.5 MG tablet Take 37.5 mg by mouth daily before breakfast.     Historical Provider, MD  pravastatin (PRAVACHOL) 20 MG tablet Take 20 mg by mouth at bedtime.     Historical Provider, MD  pregabalin (LYRICA) 150 MG capsule Take 150 mg by mouth 3 (three) times daily.    Historical Provider, MD  promethazine (PHENERGAN) 25 MG tablet Take 25 mg by mouth every 6 (six) hours as needed for nausea or vomiting.    Historical Provider, MD  QUEtiapine (SEROQUEL) 300 MG tablet Take 300 mg by mouth at bedtime.    Historical Provider, MD  traMADol (ULTRAM) 50 MG tablet Take 50 mg by mouth every 6 (six) hours as needed for moderate pain.     Historical Provider, MD  verapamil (CALAN) 40 MG tablet Take 1 tablet (40 mg total) by mouth 2 (two) times daily. Please stop the [facility] when you starts this medicine: For high blood pressure 09/23/15   Encarnacion Slates, NP  zolpidem (AMBIEN) 10 MG tablet Take 10 mg by mouth at bedtime as needed for sleep.     Historical Provider, MD    Family History Family History  Problem Relation Age of Onset  . Gallbladder disease Mother     mets  . Diabetes Mother   . Colon polyps Mother   . Hypertension Mother   . Alcoholism Cousin   . Diabetes Father   . Diabetic kidney disease Maternal Aunt   . Diabetes Maternal Aunt     Social History Social History  Substance Use Topics  . Smoking status: Current Every Day Smoker    Packs/day: 0.50    Types: Cigarettes  . Smokeless  tobacco: Never Used  . Alcohol use No     Comment: quit     Allergies   Hydrocodone   Review of Systems Review of Systems  All systems reviewed and are negative for acute change except as noted in the HPI.   Physical Exam Updated Vital Signs BP (!) 152/97   Pulse 64   Temp 98.6 F (37 C) (Oral)   Resp (!) 26   Ht 5\' 4"  (1.626 m)   Wt 190 lb (86.2 kg)   SpO2 100%   BMI 32.61 kg/m   Physical Exam  Constitutional: She is oriented to person, place, and time. She appears well-developed and well-nourished. No distress.  HENT:  Head: Normocephalic and atraumatic.  Nose: Nose normal.  Mouth/Throat: Oropharynx is clear and moist. No oropharyngeal exudate.  Eyes: Conjunctivae and EOM are normal. Pupils are equal, round, and reactive to light. No scleral icterus.  Neck: Normal range of motion. Neck supple. No JVD present. No tracheal deviation present. No thyromegaly present.  Cardiovascular: Normal rate, regular rhythm and normal heart sounds.  Exam reveals no gallop and no friction rub.   No murmur heard. Pulmonary/Chest: Breath sounds normal. No respiratory distress. She has no wheezes. She exhibits no tenderness.  Speaks in 3 word sentences. Labored breathing  Abdominal: Soft. Bowel sounds are normal. She exhibits no distension and no mass. There is no tenderness. There is no rebound and no guarding.  Musculoskeletal: Normal range of motion. She exhibits no edema or tenderness.  Lymphadenopathy:    She has no cervical adenopathy.  Neurological: She is alert and oriented to person, place, and time. No cranial nerve deficit. She exhibits normal muscle tone.  Skin: Skin is warm and dry. No rash noted. No erythema. No pallor.  Nursing note and vitals reviewed.    ED Treatments / Results  DIAGNOSTIC STUDIES:  Oxygen Saturation is 100% on RA, normal by my interpretation.    COORDINATION OF CARE:  1:43 AM Discussed treatment plan with pt at bedside and pt agreed to  plan.  Labs (all labs ordered are listed, but only abnormal results are displayed) Labs Reviewed  CBC WITH DIFFERENTIAL/PLATELET - Abnormal; Notable for the following:       Result Value   RDW 15.7 (*)    Platelets 494 (*)    All other components within normal limits  COMPREHENSIVE METABOLIC PANEL - Abnormal; Notable for the following:    Glucose, Bld 176 (*)    All other components within normal limits  D-DIMER, QUANTITATIVE (NOT AT [facility]) - Abnormal; Notable for the following:    D-Dimer, Quant 0.92 (*)    All other components within normal limits  BRAIN NATRIURETIC PEPTIDE    EKG  EKG Interpretation  Date/Time:  Tuesday April 25 2016 20:32:04 EDT Ventricular Rate:  65 PR Interval:  110 QRS Duration: 80 QT Interval:  414 QTC Calculation: 430 R Axis:   65 Text Interpretation:  Sinus rhythm with short PR Septal infarct , age undetermined Abnormal ECG Rate slower Confirmed by Glynn Octave [id number]) on 04/26/2016 1:34:19 AM       Radiology Dg Chest 2 View  Result Date: 04/25/2016 CLINICAL DATA:  Shortness of breath and wheezing 1 day. EXAM: CHEST  2 VIEW COMPARISON:  03/23/2016 FINDINGS: The heart size and mediastinal contours are within normal limits. Both lungs are clear. The visualized skeletal structures are unremarkable. IMPRESSION: No active cardiopulmonary disease. Electronically Signed   By: Marin Olp M.D.   On: 04/25/2016 21:26   Ct Angio Chest Pe W Or Wo Contrast  Result Date: 04/26/2016 CLINICAL DATA:  Shortness of breath. Elevated D-dimer. History of polysubstance abuse, hypertension, gastroesophageal reflux disease, and diabetes. EXAM: CT ANGIOGRAPHY CHEST WITH CONTRAST TECHNIQUE: Multidetector CT imaging of the chest was performed using the standard protocol during bolus administration of intravenous contrast. Multiplanar CT image reconstructions and MIPs were obtained to evaluate the vascular anatomy. CONTRAST:  56 mL Isovue 370 COMPARISON:  None.  FINDINGS: Cardiovascular: Eccentric filling defects are demonstrated in left lower lobe segmental and subsegmental branches consistent with pulmonary embolus.  This could be acute or chronic. Low clot burden is present. RV to LV ratio is normal. No findings to suggest right heart strain. Normal heart size. No pericardial effusion. Normal caliber thoracic aorta. Mediastinum/Nodes: No enlarged mediastinal, hilar, or axillary lymph nodes. Thyroid gland, trachea, and esophagus demonstrate no significant findings. Lungs/Pleura: Evaluation of lungs is limited due to respiratory motion artifact. No focal consolidation is demonstrated. No pleural effusions. No pneumothorax. Upper Abdomen: Visualized upper abdominal contents are grossly unremarkable. Musculoskeletal: Degenerative changes in the spine. No destructive bone lesions. Review of the MIP images confirms the above findings. IMPRESSION: Focal eccentric pulmonary emboli demonstrated in the left lower lung. No evidence to suggest right heart strain. No focal consolidation in the lungs. These results were called by telephone at the time of interpretation on 04/26/2016 at 6:13 am to Dr. Everlene Balls , who verbally acknowledged these results. Electronically Signed   By: Lucienne Capers M.D.   On: 04/26/2016 06:14    Procedures Procedures (including critical care time)  Medications Ordered in ED Medications  albuterol (PROVENTIL) (2.5 MG/3ML) 0.083% nebulizer solution 5 mg (5 mg Nebulization Given 04/26/16 0141)  ipratropium (ATROVENT) nebulizer solution 0.5 mg (0.5 mg Nebulization Given 04/26/16 0142)  acetaminophen (TYLENOL) tablet 1,000 mg (1,000 mg Oral Given 04/26/16 0247)  metoCLOPramide (REGLAN) tablet 10 mg (10 mg Oral Given 04/26/16 0247)  ALPRAZolam [name]) tablet 1 mg (1 mg Oral Given 04/26/16 0405)  iopamidol (ISOVUE-370) 76 % injection 100 mL (100 mLs Intravenous Contrast Given 04/26/16 0555)     Initial Impression / Assessment and Plan / ED Course   I have reviewed the triage vital signs and the nursing notes.  Pertinent labs & imaging results that were available during my care of the patient were reviewed by me and considered in my medical decision making (see chart for details).     Patient presents emergency department for acute shortness of breath. D-dimer is elevated. She was initially given a breathing treatment by respiratory therapy however my exam there is no wheezing. She was given Tylenol and Reglan for headache. She specifically required Xanax for an episode of anxiety. CTA does reveal left lower lobe pulmonary emboli. She denies any risk factors to me for pulmonary embolism. She'll require admission for further care and to evaluate this hypercoagulable state.  Final Clinical Impressions(s) / ED Diagnoses   Final diagnoses:  None    New Prescriptions New Prescriptions   No medications on file     I personally performed the services described in this documentation, which was scribed in my presence. The recorded information has been reviewed and is accurate.       Everlene Balls, MD 04/26/16 [phone]

## 2016-04-26 NOTE — Progress Notes (Signed)
ANTICOAGULATION CONSULT NOTE - Initial Consult  Pharmacy Consult for Xarelto Indication: pulmonary embolus  Allergies  Allergen Reactions  . Hydrocodone Nausea Only   Patient Measurements: Height: 5\' 4"  (162.6 cm) Weight: 190 lb (86.2 kg) IBW/kg (Calculated) : 54.7   Vital Signs: Temp: 98.6 F (37 C) (04/18 0054) Temp Source: Oral (04/18 0054) BP: 108/68 (04/18 0700) Pulse Rate: 65 (04/18 0700)  Labs:  Recent Labs  04/25/16 2034  HGB 13.1  HCT 40.4  PLT 494*  CREATININE 0.88    Estimated Creatinine Clearance: 72.2 mL/min (by C-G formula based on SCr of 0.88 mg/dL).   Medical History: Past Medical History:  Diagnosis Date  . Anxiety   . Arthritis   . Bipolar 1 disorder ([place])   . Cataract   . Cocaine abuse   . Colon polyps   . Depression   . Diabetes mellitus without complication ([place])   . Drug abuse   . GERD (gastroesophageal reflux disease)   . H. pylori infection   . Helicobacter pylori gastritis 02/08/2016  . Hypercholesteremia   . Hypertension   . Hypothyroid   . Marijuana abuse   . Nerve damage    both legs  . Osteopenia   . Pancreatitis   . Polysubstance abuse   . Suicidal ideation    Medications:   (Not in a hospital admission)  Assessment: 61 yo female with positive d-dimer found to have pulmonary embolism on CTA. No anticoagualtion prior to admission. CBC stable; renal function appropriate for age CrCl ~70-75. Pharmacy has been asked to start Xarelto.  Goal of Therapy:  aPTT 66-102s seconds Monitor platelets by anticoagulation protocol: Yes   Plan:  Xarelto 15mg  BID for 21 days Xarelto 20mg  QD  Monitor renal function and for s/sx of bleeding  Jodean Lima Yanelle Sousa 04/26/2016,7:44 AM

## 2016-04-26 NOTE — ED Notes (Addendum)
Pt came to nurse first desk and inquired about wait time.  RN explained that she had been called on three separate occasions however she did not respond.  Pt reported that she did not hear her name called.  Pt will be seen next.

## 2016-04-27 ENCOUNTER — Observation Stay (HOSPITAL_COMMUNITY): Payer: Medicare Other

## 2016-04-27 DIAGNOSIS — E118 Type 2 diabetes mellitus with unspecified complications: Secondary | ICD-10-CM | POA: Diagnosis not present

## 2016-04-27 DIAGNOSIS — F3163 Bipolar disorder, current episode mixed, severe, without psychotic features: Secondary | ICD-10-CM | POA: Diagnosis not present

## 2016-04-27 DIAGNOSIS — R8290 Unspecified abnormal findings in urine: Secondary | ICD-10-CM | POA: Diagnosis not present

## 2016-04-27 DIAGNOSIS — I2699 Other pulmonary embolism without acute cor pulmonale: Secondary | ICD-10-CM | POA: Diagnosis not present

## 2016-04-27 LAB — CBC
HEMATOCRIT: 36.7 % (ref 36.0–46.0)
HEMOGLOBIN: 11.6 g/dL — AB (ref 12.0–15.0)
MCH: 27.6 pg (ref 26.0–34.0)
MCHC: 31.6 g/dL (ref 30.0–36.0)
MCV: 87.2 fL (ref 78.0–100.0)
Platelets: 396 10*3/uL (ref 150–400)
RBC: 4.21 MIL/uL (ref 3.87–5.11)
RDW: 16.2 % — ABNORMAL HIGH (ref 11.5–15.5)
WBC: 7.3 10*3/uL (ref 4.0–10.5)

## 2016-04-27 LAB — BLOOD GAS, ARTERIAL
ACID-BASE EXCESS: 0.3 mmol/L (ref 0.0–2.0)
BICARBONATE: 25 mmol/L (ref 20.0–28.0)
DRAWN BY: 252031
O2 CONTENT: 4 L/min
O2 SAT: 98.2 %
Patient temperature: 98.6
pCO2 arterial: 44.7 mmHg (ref 32.0–48.0)
pH, Arterial: 7.366 (ref 7.350–7.450)
pO2, Arterial: 118 mmHg — ABNORMAL HIGH (ref 83.0–108.0)

## 2016-04-27 LAB — BASIC METABOLIC PANEL
ANION GAP: 7 (ref 5–15)
BUN: 7 mg/dL (ref 6–20)
CHLORIDE: 110 mmol/L (ref 101–111)
CO2: 24 mmol/L (ref 22–32)
Calcium: 8.4 mg/dL — ABNORMAL LOW (ref 8.9–10.3)
Creatinine, Ser: 0.79 mg/dL (ref 0.44–1.00)
Glucose, Bld: 122 mg/dL — ABNORMAL HIGH (ref 65–99)
POTASSIUM: 3.4 mmol/L — AB (ref 3.5–5.1)
SODIUM: 141 mmol/L (ref 135–145)

## 2016-04-27 LAB — HIV ANTIBODY (ROUTINE TESTING W REFLEX): HIV Screen 4th Generation wRfx: NONREACTIVE

## 2016-04-27 LAB — RAPID URINE DRUG SCREEN, HOSP PERFORMED
AMPHETAMINES: NOT DETECTED
BENZODIAZEPINES: POSITIVE — AB
Barbiturates: NOT DETECTED
Cocaine: POSITIVE — AB
OPIATES: NOT DETECTED
Tetrahydrocannabinol: NOT DETECTED

## 2016-04-27 LAB — GLUCOSE, CAPILLARY
Glucose-Capillary: 120 mg/dL — ABNORMAL HIGH (ref 65–99)
Glucose-Capillary: 122 mg/dL — ABNORMAL HIGH (ref 65–99)
Glucose-Capillary: 127 mg/dL — ABNORMAL HIGH (ref 65–99)

## 2016-04-27 LAB — URINE CULTURE

## 2016-04-27 MED ORDER — QUETIAPINE FUMARATE 25 MG PO TABS: 100.0000 mg | ORAL_TABLET | Freq: Every day | ORAL | Status: DC

## 2016-04-27 MED ORDER — SODIUM CHLORIDE 0.9 % IV BOLUS (SEPSIS)
500.0000 mL | Freq: Once | INTRAVENOUS | Status: AC
  Administered 2016-04-27: 500 mL via INTRAVENOUS

## 2016-04-27 MED ORDER — POTASSIUM CHLORIDE CRYS ER 20 MEQ PO TBCR: 40.0000 meq | EXTENDED_RELEASE_TABLET | Freq: Once | ORAL | Status: DC

## 2016-04-27 MED ORDER — ALPRAZOLAM 0.5 MG PO TABS: 1.0000 mg | ORAL_TABLET | Freq: Three times a day (TID) | ORAL | Status: DC | PRN

## 2016-04-27 MED ORDER — ALPRAZOLAM 0.5 MG PO TABS: 1.0000 mg | ORAL_TABLET | Freq: Two times a day (BID) | ORAL | Status: DC | PRN

## 2016-04-27 MED ORDER — RIVAROXABAN (XARELTO) VTE STARTER PACK (15 & 20 MG): ORAL_TABLET | ORAL | 0 refills | Status: DC

## 2016-04-27 NOTE — Progress Notes (Signed)
Nurse found patient during hourly rounding very somnolent with food in her mouth. Patient did not wake up to verbal queues but aroused to sternal rub. Patient's respiratory rate 10 sats 92 on Room air. Patient placed on 4L nasal canula by rapid Response. Sats went up to 100.  Chaney Malling, NP from triad notified. Order for blood gas and stat ABG by rapid. Findings WNL. Placed patient on continuous pulse ox. Patient current vitals BP 91/65, HR 73 ,O2 98. Will continue to monitor patient.

## 2016-04-27 NOTE — Progress Notes (Signed)
Pt has orders to be discharged. Discharge instructions given and pt has no additional questions at this time. Medication regimen reviewed and pt educated. Pt verbalized understanding and has no additional questions. Telemetry box removed. IV removed and site in good condition. Pt stable and waiting for security to escort her to her vehicle which is parked in ED lot. Clarise Cruz RN

## 2016-04-27 NOTE — Progress Notes (Signed)
Pt O2 sat 100% on RA. Still refuses to do walking O2 sats. She states she "can't take a deep breath" then precedes to take a deep breath. MD is aware. Pt will still be DC.

## 2016-04-27 NOTE — Progress Notes (Signed)
Patient responds to voice this am. Patient sat 100% on 4L. Respirations 12. Patient in bed resting.

## 2016-04-27 NOTE — Significant Event (Signed)
Rapid Response Event Note  Overview: Time Called: 0119 Arrival Time: 0125 Event Type: Other (Comment)  Initial Focused Assessment: Called by Danae Chen, RN on 3E for pt with minimal responsiveness.  On arrival, pt was obtunded and breathing 8-10 times/min.  Per pt MAR, 1 mg xanax, 150 mg lyrica, 300 mg seroquel and 5 mg ambien given at 2151.   Pt is responsive only to sternal rub and becomes agitated upon awakening.  Pt oriented x 3 when sternal rub performed, then quickly goes back to sleep. Equal strength in all extremities, pupils 3, round and briskly reactive. Pt's oral cavity had food particles/vomitus and was suctioned out.  Lungs are diminished in all fields.   Initial O2 saturations 91% on RA, increased to 97% with 4L [place].   BP 92/54 at 0125, then 88/56 at 0145.  BP at 0206 91/65.   Glucose 120 Pulses strong radial bilat, NSR on central tele, rate 78.  s1 s2 upon auscultation. Skin warm and dry Urine output adequate per RN, not charted in I/O.    Interventions:   CXR- lungs mildly hypoexpanded, left basilar opacity consistent with recent diagnosis of pulmonary embolus. ABG -WNL 500 ml NS bolus, repeat BP q1 hr x 4 hrs. Continuous pulse ox Holding further sedating meds per K. Schorr.   Plan of Care (if not transferred):  Handoff to [place], [place].  Advised to call if pt deteriorates or if any concerns.  Discussed monitoring pulse ox continuously and to check BP q1 hr x 4 hrs.    Event Summary: Name of Physician Notified: Lamar Blinks, NP at 0134    at    Outcome: Stayed in room and stabalized        Marybell Robards, [place]
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
## 2016-04-27 NOTE — Progress Notes (Signed)
Pt is refusing to do walking O2 sats per MD order. She states she needs to stay another day. She states that she "hurts all over". Paged MD to make aware.

## 2016-05-26 ENCOUNTER — Telehealth: Payer: Self-pay | Admitting: Internal Medicine

## 2016-05-26 MED ORDER — DOXYCYCLINE HYCLATE 100 MG PO CAPS: 100.0000 mg | ORAL_CAPSULE | Freq: Two times a day (BID) | ORAL | 0 refills | Status: DC

## 2016-05-26 MED ORDER — BISMUTH SUBSALICYLATE 262 MG PO TABS: 2.0000 | ORAL_TABLET | Freq: Four times a day (QID) | ORAL | 0 refills | Status: DC

## 2016-05-26 MED ORDER — METRONIDAZOLE 250 MG PO TABS: 250.0000 mg | ORAL_TABLET | Freq: Four times a day (QID) | ORAL | 0 refills | Status: DC

## 2016-05-26 MED ORDER — OMEPRAZOLE 20 MG PO CPDR: 20.0000 mg | DELAYED_RELEASE_CAPSULE | Freq: Two times a day (BID) | ORAL | 0 refills | Status: DC

## 2016-05-26 NOTE — Telephone Encounter (Signed)
Patient was prescribed h pylori treatment in January.  Patient reports that she never had this filled.  rx resent.  She missed her follow up as well.  Follow up set up for 07/11/16

## 2016-06-09 ENCOUNTER — Inpatient Hospital Stay (HOSPITAL_COMMUNITY)
Admission: RE | Admit: 2016-06-09 | Discharge: 2016-06-19 | DRG: 885 | Disposition: A | Discharge status: Home / Self Care | Payer: Medicare Other | Attending: Psychiatry | Admitting: Psychiatry

## 2016-06-09 ENCOUNTER — Encounter (HOSPITAL_COMMUNITY): Payer: Self-pay | Admitting: *Deleted

## 2016-06-09 DIAGNOSIS — F332 Major depressive disorder, recurrent severe without psychotic features: Secondary | ICD-10-CM | POA: Diagnosis present

## 2016-06-09 DIAGNOSIS — K219 Gastro-esophageal reflux disease without esophagitis: Secondary | ICD-10-CM | POA: Diagnosis present

## 2016-06-09 DIAGNOSIS — F1290 Cannabis use, unspecified, uncomplicated: Secondary | ICD-10-CM | POA: Diagnosis not present

## 2016-06-09 DIAGNOSIS — E785 Hyperlipidemia, unspecified: Secondary | ICD-10-CM | POA: Diagnosis present

## 2016-06-09 DIAGNOSIS — F1390 Sedative, hypnotic, or anxiolytic use, unspecified, uncomplicated: Secondary | ICD-10-CM | POA: Diagnosis not present

## 2016-06-09 DIAGNOSIS — F29 Unspecified psychosis not due to a substance or known physiological condition: Secondary | ICD-10-CM | POA: Diagnosis not present

## 2016-06-09 DIAGNOSIS — Z91410 Personal history of adult physical and sexual abuse: Secondary | ICD-10-CM

## 2016-06-09 DIAGNOSIS — E039 Hypothyroidism, unspecified: Secondary | ICD-10-CM | POA: Diagnosis present

## 2016-06-09 DIAGNOSIS — G4700 Insomnia, unspecified: Secondary | ICD-10-CM | POA: Diagnosis present

## 2016-06-09 DIAGNOSIS — F1490 Cocaine use, unspecified, uncomplicated: Secondary | ICD-10-CM | POA: Diagnosis not present

## 2016-06-09 DIAGNOSIS — Z915 Personal history of self-harm: Secondary | ICD-10-CM

## 2016-06-09 DIAGNOSIS — Z811 Family history of alcohol abuse and dependence: Secondary | ICD-10-CM | POA: Diagnosis not present

## 2016-06-09 DIAGNOSIS — E1140 Type 2 diabetes mellitus with diabetic neuropathy, unspecified: Secondary | ICD-10-CM | POA: Diagnosis present

## 2016-06-09 DIAGNOSIS — J45909 Unspecified asthma, uncomplicated: Secondary | ICD-10-CM | POA: Diagnosis present

## 2016-06-09 DIAGNOSIS — F17210 Nicotine dependence, cigarettes, uncomplicated: Secondary | ICD-10-CM | POA: Diagnosis present

## 2016-06-09 DIAGNOSIS — Z7984 Long term (current) use of oral hypoglycemic drugs: Secondary | ICD-10-CM

## 2016-06-09 DIAGNOSIS — G894 Chronic pain syndrome: Secondary | ICD-10-CM | POA: Diagnosis present

## 2016-06-09 DIAGNOSIS — E7800 Pure hypercholesterolemia, unspecified: Secondary | ICD-10-CM | POA: Diagnosis present

## 2016-06-09 DIAGNOSIS — R45851 Suicidal ideations: Secondary | ICD-10-CM | POA: Diagnosis present

## 2016-06-09 DIAGNOSIS — F1190 Opioid use, unspecified, uncomplicated: Secondary | ICD-10-CM | POA: Diagnosis not present

## 2016-06-09 DIAGNOSIS — Z79899 Other long term (current) drug therapy: Secondary | ICD-10-CM | POA: Diagnosis not present

## 2016-06-09 DIAGNOSIS — Z7982 Long term (current) use of aspirin: Secondary | ICD-10-CM

## 2016-06-09 DIAGNOSIS — F515 Nightmare disorder: Secondary | ICD-10-CM | POA: Diagnosis not present

## 2016-06-09 DIAGNOSIS — F411 Generalized anxiety disorder: Secondary | ICD-10-CM | POA: Diagnosis present

## 2016-06-09 DIAGNOSIS — F419 Anxiety disorder, unspecified: Secondary | ICD-10-CM | POA: Diagnosis not present

## 2016-06-09 DIAGNOSIS — I10 Essential (primary) hypertension: Secondary | ICD-10-CM | POA: Diagnosis present

## 2016-06-09 LAB — URINALYSIS, ROUTINE W REFLEX MICROSCOPIC
Bilirubin Urine: NEGATIVE
GLUCOSE, UA: NEGATIVE mg/dL
HGB URINE DIPSTICK: NEGATIVE
Ketones, ur: NEGATIVE mg/dL
Leukocytes, UA: NEGATIVE
Nitrite: NEGATIVE
Protein, ur: NEGATIVE mg/dL
SPECIFIC GRAVITY, URINE: 1.021 (ref 1.005–1.030)
pH: 6 (ref 5.0–8.0)

## 2016-06-09 LAB — RAPID URINE DRUG SCREEN, HOSP PERFORMED
Amphetamines: NOT DETECTED
Barbiturates: NOT DETECTED
Benzodiazepines: NOT DETECTED
Cocaine: NOT DETECTED
OPIATES: NOT DETECTED
TETRAHYDROCANNABINOL: NOT DETECTED

## 2016-06-09 LAB — GLUCOSE, CAPILLARY: GLUCOSE-CAPILLARY: 201 mg/dL — AB (ref 65–99)

## 2016-06-09 MED ORDER — HYDROXYZINE HCL 50 MG PO TABS
50.0000 mg | ORAL_TABLET | Freq: Every day | ORAL | Status: DC
  Administered 2016-06-09 – 2016-06-18 (×10): 50 mg via ORAL
  Filled 2016-06-09 (×12): qty 1

## 2016-06-09 MED ORDER — PRAVASTATIN SODIUM 10 MG PO TABS
10.0000 mg | ORAL_TABLET | Freq: Every day | ORAL | Status: DC
  Administered 2016-06-09 – 2016-06-18 (×10): 10 mg via ORAL
  Filled 2016-06-09 (×11): qty 1

## 2016-06-09 MED ORDER — HYDROXYZINE HCL 25 MG PO TABS
25.0000 mg | ORAL_TABLET | Freq: Four times a day (QID) | ORAL | Status: DC | PRN
  Administered 2016-06-09 – 2016-06-16 (×10): 25 mg via ORAL
  Filled 2016-06-09 (×10): qty 1

## 2016-06-09 MED ORDER — ASPIRIN EC 81 MG PO TBEC
81.0000 mg | DELAYED_RELEASE_TABLET | Freq: Every day | ORAL | Status: DC
  Filled 2016-06-09 (×2): qty 1

## 2016-06-09 MED ORDER — ALUM & MAG HYDROXIDE-SIMETH 200-200-20 MG/5ML PO SUSP: 30.0000 mL | ORAL | Status: DC | PRN

## 2016-06-09 MED ORDER — TRAZODONE HCL 50 MG PO TABS: 50.0000 mg | ORAL_TABLET | Freq: Every evening | ORAL | Status: DC | PRN

## 2016-06-09 MED ORDER — PANTOPRAZOLE SODIUM 40 MG PO TBEC
40.0000 mg | DELAYED_RELEASE_TABLET | Freq: Every day | ORAL | Status: DC
  Administered 2016-06-10 – 2016-06-19 (×10): 40 mg via ORAL
  Filled 2016-06-09 (×12): qty 1

## 2016-06-09 MED ORDER — ALBUTEROL SULFATE HFA 108 (90 BASE) MCG/ACT IN AERS: 1.0000 | INHALATION_SPRAY | Freq: Four times a day (QID) | RESPIRATORY_TRACT | Status: DC | PRN

## 2016-06-09 MED ORDER — PREGABALIN 75 MG PO CAPS
150.0000 mg | ORAL_CAPSULE | Freq: Three times a day (TID) | ORAL | Status: DC
  Administered 2016-06-09 – 2016-06-19 (×29): 150 mg via ORAL
  Filled 2016-06-09 (×30): qty 2

## 2016-06-09 MED ORDER — VERAPAMIL HCL 40 MG PO TABS
40.0000 mg | ORAL_TABLET | Freq: Two times a day (BID) | ORAL | Status: DC
  Filled 2016-06-09 (×2): qty 1

## 2016-06-09 MED ORDER — ACETAMINOPHEN 325 MG PO TABS
650.0000 mg | ORAL_TABLET | Freq: Four times a day (QID) | ORAL | Status: DC | PRN
  Administered 2016-06-14: 650 mg via ORAL
  Filled 2016-06-09: qty 2

## 2016-06-09 MED ORDER — VERAPAMIL HCL ER 120 MG PO TBCR
120.0000 mg | EXTENDED_RELEASE_TABLET | Freq: Every day | ORAL | Status: DC
  Administered 2016-06-10 – 2016-06-19 (×10): 120 mg via ORAL
  Filled 2016-06-09 (×12): qty 1

## 2016-06-09 MED ORDER — DULOXETINE HCL 60 MG PO CPEP
60.0000 mg | ORAL_CAPSULE | Freq: Every day | ORAL | Status: DC
  Administered 2016-06-10 – 2016-06-12 (×3): 60 mg via ORAL
  Filled 2016-06-09 (×4): qty 1

## 2016-06-09 MED ORDER — LEVOTHYROXINE SODIUM 25 MCG PO TABS
25.0000 ug | ORAL_TABLET | Freq: Every day | ORAL | Status: DC
  Administered 2016-06-10 – 2016-06-19 (×10): 25 ug via ORAL
  Filled 2016-06-09 (×11): qty 1

## 2016-06-09 MED ORDER — MAGNESIUM HYDROXIDE 400 MG/5ML PO SUSP: 30.0000 mL | Freq: Every day | ORAL | Status: DC | PRN

## 2016-06-09 MED ORDER — METFORMIN HCL 500 MG PO TABS
500.0000 mg | ORAL_TABLET | Freq: Every day | ORAL | Status: DC
  Administered 2016-06-10 – 2016-06-19 (×10): 500 mg via ORAL
  Filled 2016-06-09 (×11): qty 1

## 2016-06-09 MED ORDER — OXYCODONE-ACETAMINOPHEN 7.5-325 MG PO TABS: 1.0000 | ORAL_TABLET | Freq: Four times a day (QID) | ORAL | Status: DC | PRN

## 2016-06-09 MED ORDER — MIRTAZAPINE 15 MG PO TABS
15.0000 mg | ORAL_TABLET | Freq: Every day | ORAL | Status: DC
  Administered 2016-06-09: 15 mg via ORAL
  Filled 2016-06-09 (×3): qty 1

## 2016-06-09 MED ORDER — NICOTINE 21 MG/24HR TD PT24
21.0000 mg | MEDICATED_PATCH | Freq: Every day | TRANSDERMAL | Status: DC
  Administered 2016-06-09 – 2016-06-18 (×10): 21 mg via TRANSDERMAL
  Filled 2016-06-09 (×12): qty 1

## 2016-06-09 MED ORDER — CLONAZEPAM 0.5 MG PO TABS
0.5000 mg | ORAL_TABLET | Freq: Two times a day (BID) | ORAL | Status: DC
  Administered 2016-06-09 – 2016-06-10 (×2): 0.5 mg via ORAL
  Filled 2016-06-09 (×2): qty 1

## 2016-06-09 MED ORDER — QUETIAPINE FUMARATE ER 300 MG PO TB24
300.0000 mg | ORAL_TABLET | Freq: Every day | ORAL | Status: DC
  Administered 2016-06-09 – 2016-06-10 (×2): 300 mg via ORAL
  Filled 2016-06-09 (×5): qty 1

## 2016-06-09 MED ORDER — RIVAROXABAN 20 MG PO TABS
20.0000 mg | ORAL_TABLET | Freq: Every day | ORAL | Status: DC
  Administered 2016-06-09 – 2016-06-18 (×10): 20 mg via ORAL
  Filled 2016-06-09 (×11): qty 1

## 2016-06-09 MED ORDER — TRIAMCINOLONE ACETONIDE 0.1 % EX OINT
TOPICAL_OINTMENT | CUTANEOUS | Status: DC | PRN
  Filled 2016-06-09: qty 15

## 2016-06-09 MED ORDER — PRAVASTATIN SODIUM 20 MG PO TABS
20.0000 mg | ORAL_TABLET | Freq: Every day | ORAL | Status: DC
  Filled 2016-06-09: qty 1

## 2016-06-09 MED ORDER — OXYCODONE-ACETAMINOPHEN 5-325 MG PO TABS
1.0000 | ORAL_TABLET | Freq: Four times a day (QID) | ORAL | Status: DC | PRN
  Administered 2016-06-09 – 2016-06-19 (×17): 1 via ORAL
  Filled 2016-06-09 (×18): qty 1

## 2016-06-09 MED ORDER — OXYCODONE HCL 5 MG PO TABS
2.5000 mg | ORAL_TABLET | Freq: Four times a day (QID) | ORAL | Status: DC | PRN
  Administered 2016-06-09 – 2016-06-19 (×18): 2.5 mg via ORAL
  Filled 2016-06-09 (×19): qty 1
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
## 2016-06-09 NOTE — Progress Notes (Signed)
Adult Psychoeducational Group Note  Date:  06/09/2016 Time:  9:21 PM  Group Topic/Focus:  Wrap-Up Group:   The focus of this group is to help patients review their daily goal of treatment and discuss progress on daily workbooks.  Participation Level:  Active  Participation Quality:  Appropriate  Affect:  Appropriate  Cognitive:  Alert and Appropriate  Insight: Appropriate, Good and Improving  Engagement in Group:  Engaged  Modes of Intervention:  Discussion  Additional Comments:  Patient stated her goal for today was get her medication ordered correctly. Patient state she felt relieved when she accomplished her goal today. Patient rated her overall goal was 8 out of 10. Patient stated something positive that happen today was she decided to come in.  Candy Sledge 06/09/2016, 9:21 PM
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
## 2016-06-09 NOTE — BHH Suicide Risk Assessment (Signed)
[facility] INPATIENT:  Family/Significant Other Suicide Prevention Education  Suicide Prevention Education:  Patient Refusal for Family/Significant Other Suicide Prevention Education: The patient Melanie Foster has refused to provide written consent for family/significant other to be provided Family/Significant Other Suicide Prevention Education during admission and/or prior to discharge.  Physician notified.  Trish Mage 06/09/2016, 4:10 PM

## 2016-06-09 NOTE — Tx Team (Signed)
Interdisciplinary Treatment and Diagnostic Plan Update  06/09/2016 Time of Session: 4:11 PM  Melanie Foster MRN: [id number]  Principal Diagnosis: <principal problem not specified>  Secondary Diagnoses: Active Problems:   MDD (major depressive disorder), recurrent severe, without psychosis ([place])   Current Medications:  Current Facility-Administered Medications  Medication Dose Route Frequency Provider Last Rate Last Dose  . acetaminophen (TYLENOL) tablet 650 mg  650 mg Oral Q6H PRN Ethelene Hal, NP      . alum & mag hydroxide-simeth (MAALOX/MYLANTA) 200-200-20 MG/5ML suspension 30 mL  30 mL Oral Q4H PRN Ethelene Hal, NP      . hydrOXYzine (ATARAX/VISTARIL) tablet 25 mg  25 mg Oral Q6H PRN Ethelene Hal, NP      . magnesium hydroxide (MILK OF MAGNESIA) suspension 30 mL  30 mL Oral Daily PRN Ethelene Hal, NP      . nicotine (NICODERM CQ - dosed in mg/24 hours) patch 21 mg  21 mg Transdermal Daily Nwoko, Agnes I, NP      . traZODone (DESYREL) tablet 50 mg  50 mg Oral QHS PRN Ethelene Hal, NP        PTA Medications: Prescriptions Prior to Admission  Medication Sig Dispense Refill Last Dose  . albuterol (PROVENTIL HFA;VENTOLIN HFA) 108 (90 Base) MCG/ACT inhaler Inhale 1-2 puffs into the lungs every 6 (six) hours as needed for wheezing or shortness of breath.   unk  . aspirin EC 81 MG tablet Take 81 mg by mouth daily.   Past Week at Unknown time  . Bismuth Subsalicylate (PEPTO-BISMOL) 262 MG TABS Take 2 tablets (524 mg total) by mouth 4 (four) times daily. 112 each 0   . Brexpiprazole (REXULTI) 2 MG TABS Take 2 mg by mouth daily.   Past Week at Unknown time  . cyclobenzaprine (FLEXERIL) 10 MG tablet Take 10 mg by mouth 3 (three) times daily as needed for muscle spasms.    unk  . doxycycline (VIBRAMYCIN) 100 MG capsule Take 1 capsule (100 mg total) by mouth 2 (two) times daily. 28 capsule 0   . escitalopram (LEXAPRO) 20 MG tablet Take 20 mg by mouth daily.    Past Week at Unknown time  . levothyroxine (SYNTHROID, LEVOTHROID) 25 MCG tablet Take 1 tablet (25 mcg total) by mouth daily before breakfast. For thyroid hormone replacement 30 tablet 3 Past Week at Unknown time  . metFORMIN (GLUCOPHAGE) 500 MG tablet Take 1 tablet (500 mg total) by mouth daily with breakfast. For diabetes management 1 tablet 0 Past Week at Unknown time  . metroNIDAZOLE (FLAGYL) 250 MG tablet Take 1 tablet (250 mg total) by mouth 4 (four) times daily. 56 tablet 0   . omeprazole (PRILOSEC) 20 MG capsule Take 1 capsule (20 mg total) by mouth 2 (two) times daily. 28 capsule 0   . pravastatin (PRAVACHOL) 20 MG tablet Take 20 mg by mouth at bedtime.    Past Week at Unknown time  . QUEtiapine (SEROQUEL) 100 MG tablet Take 100 mg by mouth at bedtime.   Past Week at Unknown time  . Rivaroxaban 15 & 20 MG TBPK Take as directed on package: Start with one 60m tablet by mouth twice a day with food. On Day 22, switch to one 215mtablet once a day with food. 51 each 0   . traMADol (ULTRAM) 50 MG tablet Take 50 mg by mouth every 6 (six) hours as needed for moderate pain.    unk  . verapamil (CALAN) 40  MG tablet Take 1 tablet (40 mg total) by mouth 2 (two) times daily. Please stop the [facility] when you starts this medicine: For high blood pressure 60 tablet 3 Past Week at Unknown time    Treatment Modalities: Medication Management, Group therapy, Case management,  1 to 1 session with clinician, Psychoeducation, Recreational therapy.   Physician Treatment Plan for Primary Diagnosis: <principal problem not specified> Long Term Goal(s): Improvement in symptoms so as ready for discharge  Short Term Goals:    Medication Management: Evaluate patient's response, side effects, and tolerance of medication regimen.  Therapeutic Interventions: 1 to 1 sessions, Unit Group sessions and Medication administration.  Evaluation of Outcomes: Progressing  Physician Treatment Plan for Secondary Diagnosis:  Active Problems:   MDD (major depressive disorder), recurrent severe, without psychosis ([place])   Long Term Goal(s): Improvement in symptoms so as ready for discharge  Short Term Goals:    Medication Management: Evaluate patient's response, side effects, and tolerance of medication regimen.  Therapeutic Interventions: 1 to 1 sessions, Unit Group sessions and Medication administration.  Evaluation of Outcomes: Progressing   RN Treatment Plan for Primary Diagnosis: <principal problem not specified> Long Term Goal(s): Knowledge of disease and therapeutic regimen to maintain health will improve  Short Term Goals: Ability to disclose and discuss suicidal ideas and Ability to identify and develop effective coping behaviors will improve  Medication Management: RN will administer medications as ordered by provider, will assess and evaluate patient's response and provide education to patient for prescribed medication. RN will report any adverse and/or side effects to prescribing provider.  Therapeutic Interventions: 1 on 1 counseling sessions, Psychoeducation, Medication administration, Evaluate responses to treatment, Monitor vital signs and CBGs as ordered, Perform/monitor CIWA, COWS, AIMS and Fall Risk screenings as ordered, Perform wound care treatments as ordered.  Evaluation of Outcomes: Progressing   LCSW Treatment Plan for Primary Diagnosis: <principal problem not specified> Long Term Goal(s): Safe transition to appropriate next level of care at discharge, Engage patient in therapeutic group addressing interpersonal concerns.  Short Term Goals: Engage patient in aftercare planning with referrals and resources  Therapeutic Interventions: Assess for all discharge needs, 1 to 1 time with Social worker, Explore available resources and support systems, Assess for adequacy in community support network, Educate family and significant other(s) on suicide prevention, Complete Psychosocial  Assessment, Interpersonal group therapy.  Evaluation of Outcomes: Met  Return home, follow up current provider   Progress in Treatment: Attending groups: Yes Participating in groups: Yes Taking medication as prescribed: Yes Toleration medication: Yes, no side effects reported at this time Family/Significant other contact made: No Patient understands diagnosis: Yes AEB asking for help with depression, SI Discussing patient identified problems/goals with staff: Yes Medical problems stabilized or resolved: Yes Denies suicidal/homicidal ideation: Yes Issues/concerns per patient self-inventory: None Other: N/A  New problem(s) identified: None identified at this time.   New Short Term/Long Term Goal(s): None identified at this time.   Discharge Plan or Barriers:   Reason for Continuation of Hospitalization: Anxiety  Depression  Medication stabilization   Estimated Length of Stay: 3-5 days  Attendees: Patient: 06/09/2016  4:11 PM  Physician: Ursula Alert, MD 06/09/2016  4:11 PM  Nursing: Hoy Register, RN 06/09/2016  4:11 PM  RN Care Manager: Lars Pinks, RN 06/09/2016  4:11 PM  Social Worker: Ripley Fraise 06/09/2016  4:11 PM  Recreational Therapist: Laretta Bolster  06/09/2016  4:11 PM  Other: Norberto Sorenson 06/09/2016  4:11 PM  Other:  06/09/2016  4:11 PM  Scribe for Treatment Team:  Roque Lias LCSW 06/09/2016 4:11 PM

## 2016-06-09 NOTE — H&P (Signed)
Behavioral Health Medical Screening Exam  Melanie Foster is an 61 y.o. female.  Total Time spent with patient: 20 minutes  Psychiatric Specialty Exam: Physical Exam  Constitutional: She is oriented to person, place, and time. She appears well-developed and well-nourished.  HENT:  Head: Normocephalic.  Right Ear: External ear normal.  Left Ear: External ear normal.  Neck: Normal range of motion.  Cardiovascular: Normal rate and normal heart sounds.   Respiratory: Effort normal and breath sounds normal.  GI: Soft. Bowel sounds are normal.  Musculoskeletal: Normal range of motion.  Neurological: She is alert and oriented to person, place, and time.  Skin: Skin is warm and dry.    Review of Systems  Psychiatric/Behavioral: Positive for depression, hallucinations and suicidal ideas. Negative for memory loss and substance abuse. The patient is not nervous/anxious and does not have insomnia.   All other systems reviewed and are negative.   Blood pressure 131/90, pulse 68, temperature 98.8 F (37.1 C), temperature source Oral, resp. rate 16, SpO2 100 %.There is no height or weight on file to calculate BMI.  General Appearance: Casual  Eye Contact:  Fair  Speech:  Slow  Volume:  Decreased  Mood:  Depressed and Dysphoric  Affect:  Congruent, Depressed and Flat  Thought Process:  Coherent and Linear  Orientation:  Full (Time, Place, and Person)  Thought Content:  Logical and Hallucinations: Auditory non command, hears her mother calling her name  Suicidal Thoughts:  Yes.  with intent/plan  Homicidal Thoughts:  No  Memory:  Immediate;   Good Recent;   Fair Remote;   Fair  Judgement:  Impaired  Insight:  Lacking  Psychomotor Activity:  Decreased  Concentration: Concentration: Fair and Attention Span: Fair  Recall:  Good  Fund of Knowledge:Good  Language: Good  Akathisia:  No  Handed:  Right  AIMS (if indicated):     Assets:  Engineer, materials Social Support Transportation  Sleep:       Musculoskeletal: Strength & Muscle Tone: within normal limits Gait & Station: normal Patient leans: N/A  Blood pressure 131/90, pulse 68, temperature 98.8 F (37.1 C), temperature source Oral, resp. rate 16, SpO2 100 %.  Recommendations:  Based on my evaluation the patient does not appear to have an emergency medical condition. Pt accepted to [facility] ned [id number]  Ethelene Hal, NP 06/09/2016, 11:37 AM

## 2016-06-09 NOTE — Progress Notes (Signed)
Nursing Note : Contacted Insurance claims handler [facility] for pain management .Regarding pt's verification of medication. Reuben Likes stated pt was taking OxyCodone 7.5/325 1 qid.for pain management. [phone]
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
## 2016-06-09 NOTE — BHH Group Notes (Signed)
[place] LCSW Group Therapy  06/09/2016  1:05 PM  Type of Therapy:  Group therapy  Participation Level:  Active  Participation Quality:  Attentive  Affect:  Flat  Cognitive:  Oriented  Insight:  Limited  Engagement in Therapy:  Limited  Modes of Intervention:  Discussion, Socialization  Summary of Progress/Problems:  Chaplain was here to lead a group on themes of hope and courage.  "There is no courage with hope, and I am hopeless right now.  I'm not in a good place right now, so I don't have much to share."  Later, "I am angry, impatient, and I have racing thoughts."  Melanie Foster 06/09/2016 1:28 PM
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
## 2016-06-10 ENCOUNTER — Encounter (HOSPITAL_COMMUNITY): Payer: Self-pay | Admitting: Psychiatry

## 2016-06-10 DIAGNOSIS — F1490 Cocaine use, unspecified, uncomplicated: Secondary | ICD-10-CM

## 2016-06-10 DIAGNOSIS — G4700 Insomnia, unspecified: Secondary | ICD-10-CM

## 2016-06-10 DIAGNOSIS — F1390 Sedative, hypnotic, or anxiolytic use, unspecified, uncomplicated: Secondary | ICD-10-CM

## 2016-06-10 DIAGNOSIS — F17210 Nicotine dependence, cigarettes, uncomplicated: Secondary | ICD-10-CM

## 2016-06-10 DIAGNOSIS — R45851 Suicidal ideations: Secondary | ICD-10-CM

## 2016-06-10 DIAGNOSIS — F332 Major depressive disorder, recurrent severe without psychotic features: Principal | ICD-10-CM

## 2016-06-10 DIAGNOSIS — F1290 Cannabis use, unspecified, uncomplicated: Secondary | ICD-10-CM

## 2016-06-10 DIAGNOSIS — G894 Chronic pain syndrome: Secondary | ICD-10-CM | POA: Diagnosis present

## 2016-06-10 DIAGNOSIS — F419 Anxiety disorder, unspecified: Secondary | ICD-10-CM

## 2016-06-10 DIAGNOSIS — Z811 Family history of alcohol abuse and dependence: Secondary | ICD-10-CM

## 2016-06-10 LAB — GLUCOSE, CAPILLARY
GLUCOSE-CAPILLARY: 118 mg/dL — AB (ref 65–99)
GLUCOSE-CAPILLARY: 156 mg/dL — AB (ref 65–99)
Glucose-Capillary: 120 mg/dL — ABNORMAL HIGH (ref 65–99)
Glucose-Capillary: 162 mg/dL — ABNORMAL HIGH (ref 65–99)

## 2016-06-10 MED ORDER — CLONAZEPAM 0.5 MG PO TABS
0.5000 mg | ORAL_TABLET | Freq: Two times a day (BID) | ORAL | Status: DC | PRN
  Administered 2016-06-11 – 2016-06-18 (×14): 0.5 mg via ORAL
  Filled 2016-06-10 (×14): qty 1

## 2016-06-10 MED ORDER — PRAZOSIN HCL 1 MG PO CAPS
1.0000 mg | ORAL_CAPSULE | Freq: Every day | ORAL | Status: DC
  Administered 2016-06-10 – 2016-06-18 (×9): 1 mg via ORAL
  Filled 2016-06-10 (×10): qty 1

## 2016-06-10 MED ORDER — MIRTAZAPINE 7.5 MG PO TABS
7.5000 mg | ORAL_TABLET | Freq: Every day | ORAL | Status: DC
  Administered 2016-06-10 – 2016-06-11 (×2): 7.5 mg via ORAL
  Filled 2016-06-10 (×3): qty 1

## 2016-06-10 MED ORDER — METRONIDAZOLE 250 MG PO TABS
250.0000 mg | ORAL_TABLET | Freq: Four times a day (QID) | ORAL | Status: DC
  Administered 2016-06-10 – 2016-06-19 (×36): 250 mg via ORAL
  Filled 2016-06-10 (×41): qty 1

## 2016-06-10 MED ORDER — DULOXETINE HCL 30 MG PO CPEP
30.0000 mg | ORAL_CAPSULE | Freq: Every evening | ORAL | Status: DC
  Administered 2016-06-10 – 2016-06-11 (×2): 30 mg via ORAL
  Filled 2016-06-10 (×3): qty 1

## 2016-06-10 MED ORDER — ZOLPIDEM TARTRATE 5 MG PO TABS
5.0000 mg | ORAL_TABLET | Freq: Every evening | ORAL | Status: DC | PRN
  Administered 2016-06-11 – 2016-06-13 (×3): 5 mg via ORAL
  Filled 2016-06-10 (×3): qty 1

## 2016-06-10 MED ORDER — DOXYCYCLINE HYCLATE 100 MG PO TABS
100.0000 mg | ORAL_TABLET | Freq: Two times a day (BID) | ORAL | Status: DC
  Administered 2016-06-10 – 2016-06-19 (×19): 100 mg via ORAL
  Filled 2016-06-10 (×22): qty 1

## 2016-06-10 NOTE — Progress Notes (Signed)
Patient denies SI, HI and AVH.  Patient reported having difficulty sleeping and very vivid nightmares that made her anxious and agitated upon waking.  Patient was compliant with medications and attended groups.   Assess patient for safety, offer medications as prescribed, engage patient in 1:1 staff talks.   Continue to monitor as planned. Patient able to contract for safety.
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
## 2016-06-10 NOTE — Progress Notes (Signed)
Adult Psychoeducational Group Note  Date:  06/10/2016 Time:  9:26 PM  Group Topic/Focus:  Wrap-Up Group:   The focus of this group is to help patients review their daily goal of treatment and discuss progress on daily workbooks.  Participation Level:  Did Not Attend  Participation Quality:  Patient did not attend  Affect:  Patient did not attend  Cognitive:  Patient did not attend  Insight: None  Engagement in Group:  Patient did not attend  Modes of Intervention:  Patient did not attend  Additional Comments:  Patient did not attend evening wrap up group.  Candy Sledge 06/10/2016, 9:26 PM

## 2016-06-10 NOTE — [facility] Suicide Risk Assessment (Signed)
[facility] Admission Suicide Risk Assessment   Nursing information obtained from:  Patient Demographic factors:  Divorced or widowed, Unemployed Current Mental Status:  Suicidal ideation indicated by patient, Suicide plan, Plan includes specific time, place, or method, Self-harm thoughts, Self-harm behaviors, Intention to act on suicide plan Loss Factors:  NA Historical Factors:  Prior suicide attempts Risk Reduction Factors:  Sense of responsibility to family, Living with another person, especially a relative, Positive social support  Total Time spent with patient: 20 minutes Principal Problem: MDD (major depressive disorder), recurrent severe, without psychosis ([place]) Diagnosis:   Patient Active Problem List   Diagnosis Date Noted  . Chronic pain syndrome [G89.4] 06/10/2016  . MDD (major depressive disorder), recurrent severe, without psychosis ([place]) [F33.2] 06/09/2016  . Left pulmonary embolus ([place]) [I26.99] 04/26/2016  . Abnormal urinalysis [R82.90] 04/26/2016  . Diabetes mellitus with complication ([place]) [I34.7]   . Helicobacter pylori gastritis [K29.70, B96.81] 02/08/2016  . Bipolar disorder, mixed, severe, w/o psychotic features ([place]) [F31.63] 09/16/2015  . Cocaine use disorder, severe, dependence ([place]) [F14.20] 09/16/2015  . Cannabis use disorder, mild, abuse [F12.10] 09/16/2015  . Polysubstance dependence ([place]) [F19.20] 09/15/2015  . Suicidal ideation [R45.851] 07/14/2015  . Hypertension [I10] 07/14/2015  . Drug overdose [T50.901A] 07/14/2015  . Marijuana abuse [F12.10] 07/14/2015  . Chronic back pain [M54.9, G89.29] 07/14/2015  . Suicide attempt ([place]) [T14.91XA]   . Herpes simplex [B00.9] 04/28/2015  . Tobacco abuse [Z72.0] 04/28/2015  . Chronic bronchitis ([place]) [J42] 04/28/2015  . DM neuropathy, type II diabetes mellitus ([place]) [E11.40] 04/28/2015  . Hyperlipidemia [E78.5] 04/22/2015  . Type 2 diabetes mellitus with hyperglycemia ([place]) [E11.65] 04/22/2015  . Hypothyroidism  [E03.9] 04/22/2015   Subjective Data: Please see H&P.   Continued Clinical Symptoms:  Alcohol Use Disorder Identification Test Final Score (AUDIT): 0 The "Alcohol Use Disorders Identification Test", Guidelines for Use in Primary Care, Second Edition.  World Pharmacologist [facility]). Score between 0-7:  no or low risk or alcohol related problems. Score between 8-15:  moderate risk of alcohol related problems. Score between 16-19:  high risk of alcohol related problems. Score 20 or above:  warrants further diagnostic evaluation for alcohol dependence and treatment.   CLINICAL FACTORS:   Severe Anxiety and/or Agitation Panic Attacks Depression:   Impulsivity Insomnia Chronic Pain Previous Psychiatric Diagnoses and Treatments Medical Diagnoses and Treatments/Surgeries   Musculoskeletal: Strength & Muscle Tone: within normal limits Gait & Station: normal Patient leans: N/A  Psychiatric Specialty Exam: Physical Exam  ROS  Blood pressure (!) 145/88, pulse 97, temperature 98.4 F (36.9 C), temperature source Oral, resp. rate 18, height 5' 4.17" (1.63 m), weight 83 kg (183 lb), SpO2 99 %.Body mass index is 31.24 kg/m.          Please see H&P.                                                 COGNITIVE FEATURES THAT CONTRIBUTE TO RISK:  Closed-mindedness, Polarized thinking and Thought constriction (tunnel vision)    SUICIDE RISK:   Moderate:  Frequent suicidal ideation with limited intensity, and duration, some specificity in terms of plans, no associated intent, good self-control, limited dysphoria/symptomatology, some risk factors present, and identifiable protective factors, including available and accessible social support.  PLAN OF CARE: Please see H&P.   I certify that inpatient services furnished can reasonably be  expected to improve the patient's condition.   Joliet Mallozzi, MD 06/10/2016, 1:46 PM
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
## 2016-06-10 NOTE — Progress Notes (Signed)
D: Pt at the time of assessment endorsed moderate anxiety and depression; states, "Today is my first day here; I hope I will feel better tomorrow." Pt also complained moderate chronic back pain. Pt denied SI, HI or AVH. Pt could be very needy. A: Medications offered as prescribed. All patient's questions and concerns addressed. Support, encouragement, and safe environment provided. Will continue to monitor for any changes. 15-minute safety checks continue. R: Pt was med compliant.  Pt attended wrap-up group. Safety checks continue.

## 2016-06-11 LAB — GLUCOSE, CAPILLARY
GLUCOSE-CAPILLARY: 138 mg/dL — AB (ref 65–99)
GLUCOSE-CAPILLARY: 138 mg/dL — AB (ref 65–99)
Glucose-Capillary: 105 mg/dL — ABNORMAL HIGH (ref 65–99)
Glucose-Capillary: 154 mg/dL — ABNORMAL HIGH (ref 65–99)

## 2016-06-11 LAB — COMPREHENSIVE METABOLIC PANEL
ALT: 12 U/L — ABNORMAL LOW (ref 14–54)
ANION GAP: 4 — AB (ref 5–15)
AST: 16 U/L (ref 15–41)
Albumin: 3.6 g/dL (ref 3.5–5.0)
Alkaline Phosphatase: 71 U/L (ref 38–126)
BILIRUBIN TOTAL: 0.6 mg/dL (ref 0.3–1.2)
BUN: 12 mg/dL (ref 6–20)
CHLORIDE: 108 mmol/L (ref 101–111)
CO2: 28 mmol/L (ref 22–32)
Calcium: 9.5 mg/dL (ref 8.9–10.3)
Creatinine, Ser: 0.7 mg/dL (ref 0.44–1.00)
GFR calc Af Amer: >60 mL/min (ref >60)
GFR calc non Af Amer: >60 mL/min (ref >60)
GLUCOSE: 115 mg/dL — AB (ref 65–99)
POTASSIUM: 4.1 mmol/L (ref 3.5–5.1)
Sodium: 140 mmol/L (ref 135–145)
TOTAL PROTEIN: 7.1 g/dL (ref 6.5–8.1)

## 2016-06-11 LAB — TSH: TSH: 1.736 u[IU]/mL (ref 0.350–4.500)

## 2016-06-11 LAB — CBC
HEMATOCRIT: 38.6 % (ref 36.0–46.0)
Hemoglobin: 12.4 g/dL (ref 12.0–15.0)
MCH: 27.8 pg (ref 26.0–34.0)
MCHC: 32.1 g/dL (ref 30.0–36.0)
MCV: 86.5 fL (ref 78.0–100.0)
PLATELETS: 371 10*3/uL (ref 150–400)
RBC: 4.46 MIL/uL (ref 3.87–5.11)
RDW: 15.1 % (ref 11.5–15.5)
WBC: 8.4 10*3/uL (ref 4.0–10.5)

## 2016-06-11 LAB — LIPID PANEL
CHOL/HDL RATIO: 2.9 ratio
Cholesterol: 150 mg/dL (ref 0–200)
HDL: 51 mg/dL (ref >40)
LDL CALC: 61 mg/dL (ref 0–99)
Triglycerides: 189 mg/dL — ABNORMAL HIGH (ref <150)
VLDL: 38 mg/dL (ref 0–40)

## 2016-06-11 LAB — HEPATIC FUNCTION PANEL
ALK PHOS: 82 U/L (ref 38–126)
ALT: 13 U/L — AB (ref 14–54)
AST: 15 U/L (ref 15–41)
Albumin: 3.5 g/dL (ref 3.5–5.0)
Bilirubin, Direct: 0.1 mg/dL (ref 0.1–0.5)
Indirect Bilirubin: 0.2 mg/dL — ABNORMAL LOW (ref 0.3–0.9)
TOTAL PROTEIN: 7 g/dL (ref 6.5–8.1)
Total Bilirubin: 0.3 mg/dL (ref 0.3–1.2)

## 2016-06-11 MED ORDER — SENNOSIDES-DOCUSATE SODIUM 8.6-50 MG PO TABS
1.0000 | ORAL_TABLET | Freq: Every day | ORAL | Status: DC
  Administered 2016-06-11 – 2016-06-13 (×3): 1 via ORAL
  Filled 2016-06-11 (×5): qty 1

## 2016-06-11 MED ORDER — QUETIAPINE FUMARATE ER 50 MG PO TB24
350.0000 mg | ORAL_TABLET | Freq: Every day | ORAL | Status: DC
  Administered 2016-06-11 – 2016-06-18 (×8): 350 mg via ORAL
  Filled 2016-06-11 (×9): qty 1
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
## 2016-06-11 NOTE — Plan of Care (Signed)
Problem: Coping: Goal: Ability to cope will improve Outcome: Progressing Pt had real bad anxiety attack , but after talking to writer pt calmed down and began talking about things to help her the next time

## 2016-06-11 NOTE — Progress Notes (Signed)
Patient ID: Melanie Foster, female   DOB: Aug 18, 1955, 61 y.o.   MRN: [id number]    D: Pt has been very labile on the unit today, at times she is very happy other times she is depressed and crying. Pt reported that she felt like she was in a black hole and that nothing was working for her. Pt reported that her depression was a 10, her hopelessness was a 10, and her anxiety was a 10. Pt reported that she was positive SI, however she could contract for safety. Pt reported that her goal for today was to work on her depression. Pt reported being negative HI, no AH/VH noted. A: 15 min checks continued for patient safety. R: Pt safety maintained.

## 2016-06-11 NOTE — Plan of Care (Signed)
Problem: Coping: Goal: Ability to cope will improve Outcome: Progressing Pt has been visible on the unit, pt been actively engaged with staff during the evening

## 2016-06-11 NOTE — Progress Notes (Signed)
D: Pt passive SI-contracts for safety. Pt is pleasant and cooperative. Pt appears very lonely , pt very needy this evening. Pt became more needy when she saw staff giving the pt going in the quiet room a lot of attention.    A: Pt was offered support and encouragement. Pt was given scheduled medications. Pt was encourage to attend groups. Q 15 minute checks were done for safety.    R:Pt attends groups and interacts well with peers and staff. Pt is taking medication. Pt has no complaints.Pt receptive to treatment and safety maintained on unit.

## 2016-06-11 NOTE — [facility] Group Notes (Signed)
[facility] Group Notes:  (Clinical Social Work)  06/11/2016  11:00AM-12:00PM  Summary of Progress/Problems:  The main focus of today's process group was to listen to a variety of genres of music and to identify that different types of music provoke different responses.  The patient then was able to identify personally what was soothing for them, as well as energizing, as well as how patient can personally use this knowledge in sleep habits, with depression, and with other symptoms.  The patient expressed at the beginning of group the overall feeling of Depression at a 10 out of 10 and Anxiety at a 10 out of 10.  Throughout group, she sang the music, danced in her chair, and smiled.  She asked for the names of the songs to be written down and provided to her, which was done.  She requested a song in private and this was played for the group.  At the end of group she said her depression was now at a 9 and her anxiety at a 7.    Type of Therapy:  Music Therapy   Participation Level:  Active  Participation Quality:  Attentive and Sharing  Affect:  Blunted  Cognitive:  Oriented  Insight:  Engaged  Engagement in Therapy:  Engaged  Modes of Intervention:   Activity, Exploration  Selmer Dominion, LCSW 06/11/2016
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
## 2016-06-12 LAB — HEMOGLOBIN A1C
Hgb A1c MFr Bld: 6.2 % — ABNORMAL HIGH (ref 4.8–5.6)
Mean Plasma Glucose: 131 mg/dL

## 2016-06-12 LAB — GLUCOSE, CAPILLARY
GLUCOSE-CAPILLARY: 93 mg/dL (ref 65–99)
Glucose-Capillary: 137 mg/dL — ABNORMAL HIGH (ref 65–99)
Glucose-Capillary: 118 mg/dL — ABNORMAL HIGH (ref 65–99)

## 2016-06-12 MED ORDER — BUPROPION HCL 75 MG PO TABS
75.0000 mg | ORAL_TABLET | Freq: Every day | ORAL | Status: DC
  Administered 2016-06-12 – 2016-06-14 (×3): 75 mg via ORAL
  Filled 2016-06-12 (×5): qty 1

## 2016-06-12 MED ORDER — DULOXETINE HCL 60 MG PO CPEP
60.0000 mg | ORAL_CAPSULE | Freq: Every evening | ORAL | Status: DC
  Administered 2016-06-13 – 2016-06-18 (×6): 60 mg via ORAL
  Filled 2016-06-12 (×7): qty 1

## 2016-06-12 NOTE — Progress Notes (Signed)
D:  Patient's self inventory sheet, patient sleeps good, sleep medication helpful.  Good appetite, low energy level, poor concentration.  Rated depression, hopeless and anxiety #9.  Denied withdrawals.  SI, contracts for safety, no plan.  Physical problems, neck, back, knees, elbow, rated pain #8 worst in past 24 hours.  Pain medication helpful.  Goal is another SW.  "I feel very low and hopeless today."  No discharge plans. "Attempting to hurt myself.   That's about all I think about now.  The MD is not in long enough to remember my thoughts." A:  Medications administered per MD orders.  Emotional support and encouragement given patient. R:  Denied HI.  Denied A/V hallucinations.  SI, contracts for safety, no plan.  Safety maintained with 15 minute checks. Stated "Life is not worth living." EKG completed and given to MD for review.
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
## 2016-06-12 NOTE — Social Work (Signed)
Referred to [facility], is [place] resident.  Anne Cunningham, LCSW Lead Clinical Social Worker Phone:  [phone]  

## 2016-06-12 NOTE — Progress Notes (Signed)
Recreation Therapy Notes  Date: 06/12/16 Time: 1000 Location: 500 Hall Dayroom  Group Topic:  Goal Setting  Goal Area(s) Addresses:  Patient will be able to identify at least 3 life goals.  Patient will be able to identify benefit of investing in goals.  Patient will be able to identify benefit of setting life goals.   Intervention: Worksheets, pencils  Activity: Life Goals.  Patients were given a worksheet with six categories (family, friends, work/school, spirituality, body and mental health).  Pt were to say what they were doing well, where they needed to improve and write a goal for how they would make the improvement.  Education:  Discharge Planning, Coping Skills, Life Goals  Education Outcome: Acknowledges Education/In Group Clarification Provided/Needs Additional Education  Clinical Observations:  Pt did not attend group.   Victorino Sparrow, LRT/CTRS         Victorino Sparrow A 06/12/2016 12:39 PM

## 2016-06-12 NOTE — BHH Group Notes (Signed)
[facility] LCSW Group Therapy  06/12/2016 1:15 pm  Type of Therapy: Process Group Therapy  Participation Level:  Active  Participation Quality:  Appropriate  Affect:  Flat  Cognitive:  Oriented  Insight:  Improving  Engagement in Group:  Limited  Engagement in Therapy:  Limited  Modes of Intervention:  Activity, Clarification, Education, Problem-solving and Support  Summary of Progress/Problems: Today's group addressed the issue of overcoming obstacles.  Patients were asked to identify their biggest obstacle post d/c that stands in the way of their on-going success, and then problem solve as to how to manage this. Invited.  Chose to not attend. Roque Lias B 06/12/2016   4:54 PM

## 2016-06-12 NOTE — Plan of Care (Signed)
Problem: Health Behavior/Discharge Planning: Goal: Ability to make decisions will improve Outcome: Progressing Nurse discussed depression/anxiety/coping skills with patient.

## 2016-06-12 NOTE — Progress Notes (Signed)
Pt presents with med seeking and needy behaviors.

## 2016-06-13 LAB — GLUCOSE, CAPILLARY
Glucose-Capillary: 107 mg/dL — ABNORMAL HIGH (ref 65–99)
Glucose-Capillary: 160 mg/dL — ABNORMAL HIGH (ref 65–99)

## 2016-06-13 NOTE — BHH Group Notes (Signed)
[facility] LCSW Group Therapy  06/13/2016 4:19 PM   Type of Therapy:  Group Therapy  Participation Level:  Active  Participation Quality:  Attentive  Affect:  Appropriate  Cognitive:  Appropriate  Insight:  Improving  Engagement in Therapy:  Engaged  Modes of Intervention:  Clarification, Education, Exploration and Socialization  Summary of Progress/Problems: Today's group focused on relapse prevention.  We defined the term, and then brainstormed on ways to prevent relapse. Started out in group, grumpy from a recent interaction with nurse.  Left after about 15 minutes and did not return.  Roque Lias B 06/13/2016 , 4:19 PM

## 2016-06-13 NOTE — Progress Notes (Signed)
Nursing Progress Note: 7p-7a D: Pt currently presents with a depressed affect and behavior. Pt states "I am not hearing any unnatural voices today. I am just depressed. My son's situation is killing me. He needs help. He acts like he is 60 years old when he's 68. I can't deal with that. It is stressing me out." Interacting appropriately with milieu. Pt reports fair sleep with current medication regimen.   A: Pt provided with medications per providers orders. Pt's labs and vitals were monitored throughout the night. Pt supported emotionally and encouraged to express concerns and questions. Pt educated on medications.  R: Pt's safety ensured with 15 minute and environmental checks. Pt currently denies SI/HI/Self Harm and AVH. Pt verbally contracts to seek staff if SI/HI or A/VH occurs and to consult with staff before acting on any harmful thoughts. Will continue to monitor.

## 2016-06-13 NOTE — Progress Notes (Signed)
Recreation Therapy Notes  Date: 06/13/16 Time: 1000 Location: 500 Hall Dayroom  Group Topic: Coping Skills  Goal Area(s) Addresses:  Patients will be able to identify positive coping skills. Patients will be able to identify the benefits of using coping skills. Patients will be able to identify benefits of using coping skills post d/c.  Behavioral Response: Engaged  Intervention: Coping Skills  Activity: Building surveyor.  Patients were to picture themselves stuck to the center of the web.  Patients were to then write the all the things and situations they feel have them stuck within the web.  Lastly, patients were to then identify the coping skills they can use to combat the things that have them stuck.  Education:Coping Skills, Discharge Planning.   Education Outcome: Acknowledges understanding/In group clarification offered/Needs additional education.   Clinical Observations/Feedback: Pt arrived late.  Pt identified some of the things that get her stuck are fear, hopelessness, lack of spirituality, anger, family situations, lack of support, being alone, stress, self-esteem and afraid of losing people.  Pt stated the coping skills she would like to use are being more patient, understanding, be more positive and not holding things in.    Victorino Sparrow, LRT/CTRS        Victorino Sparrow A 06/13/2016 12:16 PM

## 2016-06-13 NOTE — Progress Notes (Signed)
D: Pt presents with flat affect and depressed mood on initial approach. A & O X3. Denies SI, HI and AVH when assessed. Demanding, argumentative, defensive, attention seeking and confrontational with staff during interactions. Easily agitated when demands are not met immediately--"I told y'all I want to change my Education officer, museum and assigned psychiatrist;  I want a female, I need to get stuff done before I leave, y'all not listening & they are of no use to me" when told by writer that Rod had to be contacted first. Peers on unit complained of this pt's disruptive / maladaptive behavior in milieu including verbal abuse towards staff. Constantly at nursing station demanding things even when already addressed. Hoarding food and personal care items in her room. Pt was later apologetic to writer "I'm sorry about my behavior, I just get angry easily and we just talked about that in group".  A: Emotional support and availability provided to pt. Scheduled and PRN medications administered per MD's orders, effects monitored.Q 15 minutes safety checks maintained on and off unit without self harm gestures.  R: Remains compliant with medications when offered. Denies adverse drug reactions when assessed. POC maintained for safety and mood stability.

## 2016-06-13 NOTE — Progress Notes (Signed)
Recreation Therapy Notes  Animal-Assisted Activity (AAA) Program Checklist/Progress Notes Patient Eligibility Criteria Checklist & Daily Group note for Rec Tx Intervention  Date: 06/13/2016 Time: 1:55pm Location: [address]    AAA/T Program Assumption of Risk Form signed by Patient/ or Parent Legal Guardian Yes  Patient is free of allergies or sever asthma Yes  Patient reports no fear of animals Yes  Patient reports no history of cruelty to animals Yes  Patient understands his/her participation is voluntary Yes  Patient washes hands before animal contact Yes  Patient washes hands after animal contact Yes  Behavioral Response: engaged  Education: Contractor, Appropriate Animal Interaction   Education Outcome: Acknowledges education.   Clinical Observations/Feedback: Patient discussed with MD for appropriateness in pet therapy session. Both LRT and MD agree patient is appropriate for participation. Patient offered participation in session and signed necessary consent form without issue. Patient attended session and interacted appropriately with therapy dog and peers. Patient asked appropriate questions about therapy dog and his training. Patient shared stories about their pets at home with group.    Donovan Kail, Recreation Therapy Intern        Donovan Kail 06/13/2016 3:25 PM

## 2016-06-13 NOTE — Progress Notes (Signed)
Adult Psychoeducational Group Note  Date:  06/13/2016 Time:  9:36 PM  Group Topic/Focus:  Wrap-Up Group:   The focus of this group is to help patients review their daily goal of treatment and discuss progress on daily workbooks.  Participation Level:  Active  Participation Quality:  Appropriate  Affect:  Appropriate  Cognitive:  Alert  Insight: Appropriate  Engagement in Group:  Engaged  Modes of Intervention:  Discussion  Additional Comments:  Patient stated having a good day.   Melanie Foster Melanie Foster 06/13/2016, 9:36 PM

## 2016-06-14 LAB — GLUCOSE, CAPILLARY
GLUCOSE-CAPILLARY: 114 mg/dL — AB (ref 65–99)
GLUCOSE-CAPILLARY: 96 mg/dL (ref 65–99)
Glucose-Capillary: 164 mg/dL — ABNORMAL HIGH (ref 65–99)

## 2016-06-14 MED ORDER — ZOLPIDEM TARTRATE 10 MG PO TABS
10.0000 mg | ORAL_TABLET | Freq: Every evening | ORAL | Status: DC | PRN
  Administered 2016-06-14 – 2016-06-18 (×5): 10 mg via ORAL
  Filled 2016-06-14 (×5): qty 1

## 2016-06-14 MED ORDER — LUBIPROSTONE 24 MCG PO CAPS
24.0000 ug | ORAL_CAPSULE | Freq: Two times a day (BID) | ORAL | Status: DC
  Administered 2016-06-14 – 2016-06-19 (×10): 24 ug via ORAL
  Filled 2016-06-14 (×12): qty 1

## 2016-06-14 MED ORDER — BUPROPION HCL ER (XL) 150 MG PO TB24
150.0000 mg | ORAL_TABLET | Freq: Every day | ORAL | Status: DC
  Administered 2016-06-15 – 2016-06-19 (×5): 150 mg via ORAL
  Filled 2016-06-14 (×6): qty 1

## 2016-06-14 NOTE — [facility] Group Notes (Signed)
[facility] Group Notes:  (Counselor/Nursing/MHT/Case Management/Adjunct)  06/14/2016 1:15PM  Type of Therapy:  Group Therapy  Participation Level:  Active  Participation Quality:  Appropriate  Affect:  Flat  Cognitive:  Oriented  Insight:  Improving  Engagement in Group:  Limited  Engagement in Therapy:  Limited  Modes of Intervention:  Discussion, Exploration and Socialization  Summary of Progress/Problems: The topic for group was balance in life.  Pt participated in the discussion about when their life was in balance and out of balance and how this feels.  Pt discussed ways to get back in balance and short term goals they can work on to get where they want to be.  Was a bit late, and chased the Dr down when she came to the door for another patient, but returned immediately and stayed the rest of the time.  "I am unbalanced today.  I am full of anger and self pity, and it makes me feel bad physically."  Would periodically throw out some red herrings, but I would just direct questions to another person, and she would drop it.  Stated she has found "absolutely nothing" that helps her find balance, but when I commented on her organizational skills, she brightened and talked at length about how organizing helps her feel good about her environment and herself.   Trish Mage 06/14/2016 3:47 PM

## 2016-06-14 NOTE — Progress Notes (Signed)
Recreation Therapy Notes  Date: 06/14/16 Time: 1000 Location: [address]  Group Topic: Self-Esteem  Goal Area(s) Addresses:  Patient will identify positive ways to increase self-esteem. Patient will verbalize benefit of increased self-esteem.  Intervention: Colored pencils, blank crest  Activity: Crest of Arms.  Patients were to fill in the crest with things they value, things they love or anything they feel represents them.  Education: Self-Esteem, Dentist.   Education Outcome: Acknowledges education/In group clarification offered/Needs additional education  Clinical Observations/Feedback: Pt did not attend group.   Victorino Sparrow, LRT/CTRS         Victorino Sparrow A 06/14/2016 12:45 PM

## 2016-06-14 NOTE — Progress Notes (Signed)
D: Pt presents with sad affect and depressed mood. Pt rates depression 9/10. Anxiety 9/10. Pt denies SI/HI. Pt reports AH, mumbled voices. Pt reports increased depression today related to family dynamics. Pt noted to have minimal interaction on the unit today. Pt non-compliant with attending groups. Pt appears disheveled and withdrawn on approach. Pt noted be attention seeking, med seeking and intrusive. Pt fixated on chronic pain and continuously asking for pain meds. Pt asked to take Klonopin and Vistaril together for anxiety. Writer offered pt the option of taking either Klonopin or Vistaril for anxiety level. Pt agreed to take Klonopin and then walked off upset with Probation officer. Writer informed pt that her anxiety level will be reassessed in 1 hour and if med is not effective pt may then have Vistaril.   A: Medications reviewed with pt. Medications administered as ordered per MD. Verbal support provided. Pt encouraged to attend groups. 15 minute checks performed for safety.  R: Pt safety maintained.

## 2016-06-14 NOTE — Progress Notes (Signed)
[facility] Group Notes:  (Nursing/MHT/Case Management/Adjunct)  Date:  06/14/2016  Time:  9:03 PM  Type of Therapy:  Psychoeducational Skills  Participation Level:  Active  Participation Quality:  Attentive  Affect:  Flat  Cognitive:  Appropriate  Insight:  Good  Engagement in Group:  Developing/Improving  Modes of Intervention:  Education  Summary of Progress/Problems: Patient shared in group that she had a good day and that she talked quite a bit with her peers. In addition, she indicated that she felt "manic" at times but was now feeling more depressed. Her goal for tomorrow is to speak with her case manager regarding her discharge plans.   Archie Balboa S 06/14/2016, 9:03 PM
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
## 2016-06-14 NOTE — Tx Team (Signed)
Interdisciplinary Treatment and Diagnostic Plan Update  06/14/2016 Time of Session: 10:36 AM  Melanie Foster MRN: [id number]  Principal Diagnosis: MDD (major depressive disorder), recurrent severe, without psychosis ([place])  Secondary Diagnoses: Principal Problem:   MDD (major depressive disorder), recurrent severe, without psychosis ([place]) Active Problems:   Chronic pain syndrome   Current Medications:  Current Facility-Administered Medications  Medication Dose Route Frequency Provider Last Rate Last Dose  . acetaminophen (TYLENOL) tablet 650 mg  650 mg Oral Q6H PRN Ethelene Hal, NP   650 mg at 06/14/16 5465  . albuterol (PROVENTIL HFA;VENTOLIN HFA) 108 (90 Base) MCG/ACT inhaler 1-2 puff  1-2 puff Inhalation Q6H PRN Nwoko, Agnes I, NP      . alum & mag hydroxide-simeth (MAALOX/MYLANTA) 200-200-20 MG/5ML suspension 30 mL  30 mL Oral Q4H PRN Ethelene Hal, NP      . buPROPion [facility]) tablet 75 mg  75 mg Oral Daily Ursula Alert, MD   75 mg at 06/14/16 0824  . clonazePAM (KLONOPIN) tablet 0.5 mg  0.5 mg Oral BID PRN Ursula Alert, MD   0.5 mg at 06/13/16 2037  . doxycycline (VIBRA-TABS) tablet 100 mg  100 mg Oral Q12H Eappen, Saramma, MD   100 mg at 06/14/16 0824  . DULoxetine (CYMBALTA) DR capsule 60 mg  60 mg Oral QPM Eappen, Saramma, MD   60 mg at 06/13/16 1704  . hydrOXYzine (ATARAX/VISTARIL) tablet 25 mg  25 mg Oral Q6H PRN Ethelene Hal, NP   25 mg at 06/13/16 1154  . hydrOXYzine (ATARAX/VISTARIL) tablet 50 mg  50 mg Oral QHS Ethelene Hal, NP   50 mg at 06/13/16 2038  . levothyroxine (SYNTHROID, LEVOTHROID) tablet 25 mcg  25 mcg Oral QAC breakfast Lindell Spar I, NP   25 mcg at 06/14/16 [phone]  . magnesium hydroxide (MILK OF MAGNESIA) suspension 30 mL  30 mL Oral Daily PRN Ethelene Hal, NP      . metFORMIN (GLUCOPHAGE) tablet 500 mg  500 mg Oral Q breakfast Lindell Spar I, NP   500 mg at 06/14/16 0824  . metroNIDAZOLE (FLAGYL) tablet 250 mg  250  mg Oral QID Ursula Alert, MD   250 mg at 06/14/16 0824  . nicotine (NICODERM CQ - dosed in mg/24 hours) patch 21 mg  21 mg Transdermal Daily Lindell Spar I, NP   21 mg at 06/14/16 6568  . oxyCODONE-acetaminophen (PERCOCET/ROXICET) 5-325 MG per tablet 1 tablet  1 tablet Oral Q6H PRN Thomes Lolling, RPH   1 tablet at 06/14/16 1275   And  . oxyCODONE (Oxy IR/ROXICODONE) immediate release tablet 2.5 mg  2.5 mg Oral Q6H PRN Thomes Lolling, RPH   2.5 mg at 06/14/16 0825  . pantoprazole (PROTONIX) EC tablet 40 mg  40 mg Oral Daily Lindell Spar I, NP   40 mg at 06/14/16 0825  . pravastatin (PRAVACHOL) tablet 10 mg  10 mg Oral QHS Ethelene Hal, NP   10 mg at 06/13/16 2037  . prazosin (MINIPRESS) capsule 1 mg  1 mg Oral QHS Eappen, Saramma, MD   1 mg at 06/13/16 2037  . pregabalin (LYRICA) capsule 150 mg  150 mg Oral TID Ethelene Hal, NP   150 mg at 06/14/16 0825  . QUEtiapine (SEROQUEL XR) 24 hr tablet 350 mg  350 mg Oral QHS Ursula Alert, MD   350 mg at 06/13/16 2035  . rivaroxaban (XARELTO) tablet 20 mg  20 mg Oral Q supper Ethelene Hal,  NP   20 mg at 06/13/16 1648  . senna-docusate (Senokot-S) tablet 1 tablet  1 tablet Oral QHS Ursula Alert, MD   1 tablet at 06/13/16 2036  . triamcinolone ointment (KENALOG) 0.1 %   Topical PRN Ethelene Hal, NP      . verapamil (CALAN-SR) CR tablet 120 mg  120 mg Oral Daily Ethelene Hal, NP   120 mg at 06/14/16 3220  . zolpidem (AMBIEN) tablet 5 mg  5 mg Oral QHS PRN Ursula Alert, MD   5 mg at 06/13/16 2038    PTA Medications: Prescriptions Prior to Admission  Medication Sig Dispense Refill Last Dose  . ibuprofen (ADVIL,MOTRIN) 800 MG tablet Take by mouth.     Marland Kitchen ACCU-CHEK AVIVA PLUS test strip      . ACCU-CHEK SOFTCLIX LANCETS lancets      . albuterol (PROVENTIL HFA;VENTOLIN HFA) 108 (90 Base) MCG/ACT inhaler Inhale 1-2 puffs into the lungs every 6 (six) hours as needed for wheezing or shortness of  breath.   unk  . ALPRAZolam (XANAX) 1 MG tablet      . aspirin EC 81 MG tablet Take 81 mg by mouth daily.   Past Week at Unknown time  . [EXPIRED] Bismuth Subsalicylate (PEPTO-BISMOL) 262 MG TABS Take 2 tablets (524 mg total) by mouth 4 (four) times daily. 112 each 0   . Blood Glucose Monitoring Suppl (ACCU-CHEK AVIVA PLUS) w/Device KIT      . Brexpiprazole (REXULTI) 2 MG TABS Take 2 mg by mouth daily.   Past Week at Unknown time  . clonazePAM (KLONOPIN) 0.5 MG tablet      . cyclobenzaprine (FLEXERIL) 10 MG tablet Take 10 mg by mouth 3 (three) times daily as needed for muscle spasms.    unk  . [EXPIRED] doxycycline (VIBRAMYCIN) 100 MG capsule Take 1 capsule (100 mg total) by mouth 2 (two) times daily. 28 capsule 0   . DULoxetine (CYMBALTA) 30 MG capsule      . DULoxetine (CYMBALTA) 60 MG capsule      . escitalopram (LEXAPRO) 20 MG tablet Take 20 mg by mouth daily.   Past Week at Unknown time  . hydrOXYzine (VISTARIL) 50 MG capsule      . levothyroxine (SYNTHROID, LEVOTHROID) 25 MCG tablet Take 1 tablet (25 mcg total) by mouth daily before breakfast. For thyroid hormone replacement 30 tablet 3 Past Week at Unknown time  . LYRICA 100 MG capsule      . LYRICA 150 MG capsule      . metFORMIN (GLUCOPHAGE) 500 MG tablet Take 1 tablet (500 mg total) by mouth daily with breakfast. For diabetes management 1 tablet 0 Past Week at Unknown time  . [EXPIRED] metroNIDAZOLE (FLAGYL) 250 MG tablet Take 1 tablet (250 mg total) by mouth 4 (four) times daily. 56 tablet 0   . metroNIDAZOLE (FLAGYL) 500 MG tablet      . mirtazapine (REMERON) 15 MG tablet      . MYRBETRIQ 25 MG TB24 tablet      . omeprazole (PRILOSEC) 20 MG capsule Take 1 capsule (20 mg total) by mouth 2 (two) times daily. 28 capsule 0   . oxyCODONE-acetaminophen (PERCOCET) 7.5-325 MG tablet      . pravastatin (PRAVACHOL) 10 MG tablet      . pravastatin (PRAVACHOL) 20 MG tablet Take 20 mg by mouth at bedtime.    Past Week at Unknown time  .  promethazine (PHENERGAN) 25 MG tablet      .  QUEtiapine (SEROQUEL) 100 MG tablet Take 100 mg by mouth at bedtime.   Past Week at Unknown time  . QUEtiapine (SEROQUEL) 200 MG tablet      . QUEtiapine (SEROQUEL) 25 MG tablet      . QUEtiapine (SEROQUEL) 300 MG tablet      . Rivaroxaban 15 & 20 MG TBPK Take as directed on package: Start with one 60m tablet by mouth twice a day with food. On Day 22, switch to one 268mtablet once a day with food. 51 each 0   . traMADol (ULTRAM) 50 MG tablet Take 50 mg by mouth every 6 (six) hours as needed for moderate pain.    unk  . triamcinolone ointment (KENALOG) 0.1 %      . verapamil (CALAN) 40 MG tablet Take 1 tablet (40 mg total) by mouth 2 (two) times daily. Please stop the NOTelecare [facility] you starts this medicine: For high blood pressure 60 tablet 3 Past Week at Unknown time  . verapamil (VERELAN PM) 120 MG 24 hr capsule        Treatment Modalities: Medication Management, Group therapy, Case management,  1 to 1 session with clinician, Psychoeducation, Recreational therapy.   Physician Treatment Plan for Primary Diagnosis: MDD (major depressive disorder), recurrent severe, without psychosis ([place] Improvement in symptoms so as ready for discharge  Short Term Goals: Ability to identify changes in lifestyle to reduce recurrence of condition will improve Compliance with prescribed medications will improve Ability to identify changes in lifestyle to reduce recurrence of condition will improve Compliance with prescribed medications will improve  Medication Management: Evaluate patient's response, side effects, and tolerance of medication regimen.  Therapeutic Interventions: 1 to 1 sessions, Unit Group sessions and Medication administration.  Evaluation of Outcomes: Progressing  6/6: :Patient seen as depressed , withdrawn , ruminative , has sleep issues , continues to need medication readjustments. Will increase Wellbutrin to XL 150 mg  po daily for affective sx , to augment cymbalta. Continue Cymbalta to 60 mg po daily for affective sx. Seroquel 350 mg po qhs for psychosis/insomnia. Prazosin 1 mg po qhs for nightmares Will discontinue Remeron due to polypharmacy , concerns of ADRs. Will increase Ambien to 10 mg po qhs prn for insomnia. ( states it worked in the past for sleep onset).  Physician Treatment Plan for Secondary Diagnosis: Principal Problem:   MDD (major depressive disorder), recurrent severe, without psychosis ([place] Problems:   Chronic pain syndrome   Long Term Goal(s): Improvement in symptoms so as ready for discharge  Short Term Goals: Ability to identify changes in lifestyle to reduce recurrence of condition will improve Compliance with prescribed medications will improve Ability to identify changes in lifestyle to reduce recurrence of condition will improve Compliance with prescribed medications will improve  Medication Management: Evaluate patient's response, side effects, and tolerance of medication regimen.  Therapeutic Interventions: 1 to 1 sessions, Unit Group sessions and Medication administration.  Evaluation of Outcomes: Progressing   RN Treatment Plan for Primary Diagnosis: MDD (major depressive disorder), recurrent severe, without psychosis ([place]Long [place] Goal(s): Knowledge of disease and therapeutic regimen to maintain health will improve  Short Term Goals: Ability to disclose and discuss suicidal ideas and Ability to identify and develop effective coping behaviors will improve  Medication Management: RN will administer medications as ordered by provider, will assess and evaluate patient's response and provide education to patient for prescribed medication. RN will report any adverse and/or side effects to prescribing provider.  Therapeutic Interventions: 1 on 1 counseling sessions, Psychoeducation, Medication administration, Evaluate responses to treatment, Monitor vital signs  and CBGs as ordered, Perform/monitor CIWA, COWS, AIMS and Fall Risk screenings as ordered, Perform wound care treatments as ordered.  Evaluation of Outcomes: Progressing   LCSW Treatment Plan for Primary Diagnosis: MDD (major depressive disorder), recurrent severe, without psychosis ([place]) Long Term Goal(s): Safe transition to appropriate next level of care at discharge, Engage patient in therapeutic group addressing interpersonal concerns.  Short Term Goals: Engage patient in aftercare planning with referrals and resources  Therapeutic Interventions: Assess for all discharge needs, 1 to 1 time with Social worker, Explore available resources and support systems, Assess for adequacy in community support network, Educate family and significant other(s) on suicide prevention, Complete Psychosocial Assessment, Interpersonal group therapy.  Evaluation of Outcomes: Met  Return home, follow up current provider Pt states she is calling DV shelters to try to locate a bed.   Progress in Treatment: Attending groups: Yes Participating in groups: Yes Taking medication as prescribed: Yes Toleration medication: Yes, no side effects reported at this time Family/Significant other contact made: No Patient understands diagnosis: Yes AEB asking for help with depression, SI Discussing patient identified problems/goals with staff: Yes Medical problems stabilized or resolved: Yes Denies suicidal/homicidal ideation: Yes Issues/concerns per patient self-inventory: None Other: N/A  New problem(s) identified: None identified at this time.   New Short Term/Long Term Goal(s): None identified at this time.   Discharge Plan or Barriers:   Reason for Continuation of Hospitalization: Anxiety  Depression  Medication stabilization   Estimated Length of Stay: 3-5 days  Attendees: Patient: 06/14/2016  10:36 AM  Physician: Ursula Alert, MD 06/14/2016  10:36 AM  Nursing: Darrol Angel, RN 06/14/2016  10:36 AM   RN Care Manager: Lars Pinks, RN 06/14/2016  10:36 AM  Social Worker: Ripley Fraise 06/14/2016  10:36 AM  Recreational Therapist: Laretta Bolster  06/14/2016  10:36 AM  Other: Norberto Sorenson 06/14/2016  10:36 AM  Other:  06/14/2016  10:36 AM    Scribe for Treatment Team:  Roque Lias LCSW 06/14/2016 10:36 AM

## 2016-06-15 DIAGNOSIS — F29 Unspecified psychosis not due to a substance or known physiological condition: Secondary | ICD-10-CM

## 2016-06-15 DIAGNOSIS — F515 Nightmare disorder: Secondary | ICD-10-CM

## 2016-06-15 DIAGNOSIS — F1190 Opioid use, unspecified, uncomplicated: Secondary | ICD-10-CM

## 2016-06-15 LAB — GLUCOSE, CAPILLARY
GLUCOSE-CAPILLARY: 113 mg/dL — AB (ref 65–99)
GLUCOSE-CAPILLARY: 109 mg/dL — AB (ref 65–99)
GLUCOSE-CAPILLARY: 151 mg/dL — AB (ref 65–99)

## 2016-06-15 MED ORDER — FLEET ENEMA 7-19 GM/118ML RE ENEM
1.0000 | ENEMA | Freq: Once | RECTAL | Status: AC
  Administered 2016-06-15: 1 via RECTAL
  Filled 2016-06-15 (×2): qty 1

## 2016-06-15 MED ORDER — SENNOSIDES-DOCUSATE SODIUM 8.6-50 MG PO TABS: 1.0000 | ORAL_TABLET | Freq: Two times a day (BID) | ORAL | Status: DC

## 2016-06-15 NOTE — Progress Notes (Signed)
Nursing Progress Note [id number]  D) Patient presents with irritable/labile/attention-seeking mood/affect. Patient requests to use wheelchair when attending karaoke this evening. Patient denies need for wheelchair while ambulating on the unit. Patient is intrusive at times asking staff about care of peers. Patient redirected to focus on her own treatment plan. Patient complains of anxiety and chronic pain this evening. Patient reports passive SI and reports she continues to hear voices. Patient denies HI. Patient contracts for safety on the unit. Patient reports sleeping well with current regimen. Patient requests a cup for her dentures and states, "what if these fall on the floor? Who is responsible if they break?" Patient referred to treatment agreement form patient signed from admission that states patients are responsible for all of their own belongings. Patient encouraged to keep dentures low to ground to prevent chance of falling.  A)  Emotional support given. 1:1 interaction and active listening provided. Patient medicated as prescribed. Medications and plan of care reviewed with patient. Patient verbalized understanding without further questions.  Snacks and fluids provided. Opportunities for questions or concerns presented to patient. Patient encouraged to continue to work on treatment goals. Labs, vital signs and patient behavior monitored throughout shift. Patient safety maintained with q15 min safety checks. Moderate fall risk precautions in place and reviewed with patient; patient verbalized understanding. Patient provided with wheelchair per provider orders.  R) Patient receptive to interaction with nurse. Patient remains safe on the unit at this time. Patient denies any adverse medication reactions at this time. Patient is in her room. Will continue to monitor.

## 2016-06-15 NOTE — [facility] Group Notes (Signed)
[facility] Mental [employer] Group Therapy  06/15/2016 , 1:12 PM    Type of Therapy:  Mental Health Association Presentation  Participation Level:  Active  Participation Quality:  Attentive  Affect:  Blunted  Cognitive:  Oriented  Insight:  Limited  Engagement in Therapy:  Engaged  Modes of Intervention:  Discussion, Education and Socialization  Summary of Progress/Problems:  Melanie Foster from [place] came to present his recovery story and play the guitar.  Stayed the entire time, engaged throughout.  Asked many questions.  Shared her story with the presenter of having no supports, having depression and anger, and feeling like she is not getting the help she needs.  Melanie Foster 06/15/2016 , 1:12 PM

## 2016-06-15 NOTE — Plan of Care (Signed)
Problem: Safety: Goal: Periods of time without injury will increase Outcome: Progressing Patient contracts for safety on the unit and is on q15 minute safety checks. Moderate fall risk precautions in place.

## 2016-06-15 NOTE — Progress Notes (Signed)
Patient attended karaoke group tonight.  

## 2016-06-15 NOTE — Progress Notes (Addendum)
   D: When asked about her day pt stated, "up and down but mostly good". When asked why it was good, pt stated, "I had a good laugh and talk with the 2 techs that was her". Stated she was also able to sit, laugh and talk with the other residents.Informed the Probation officer that she misses [place]. A:  Support and encouragement was offered. 15 min checks continued for safety.  R: Pt remains safe.    Addendum: Pt became loud and threatened to knock all cups of popcorn of the counter if she did not get her "own" bag during snack time. Staff redirected pt who later apologized
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
## 2016-06-15 NOTE — Progress Notes (Signed)
Recreation Therapy Notes  Date: 06/15/16 Time: 1000 Location: [address] Dayroom  Group Topic: Wellness  Goal Area(s) Addresses:  Patient will define components of whole wellness. Patient will verbalize benefit of whole wellness.  Behavioral Response: Engaged  Intervention:  10 plastic cups, 2 soft foam balls   Activity: Bowling.  Patients were divided into two teams.  Each cup the player knocked down counted as one point.  Each player was given two chances to knock down the cups.  If a player got a strike on their first try, then it would be the next person's turn.  The team with the highest score wins the game.     Education: Wellness, Dentist.   Education Outcome: Acknowledges education/In group clarification offered/Needs additional education.   Clinical Observations/Feedback: Pt was supportive of her peers by telling them to roll the ball or to take a different angle.  Pt cheered her peers on.  Pt didn't play but she kept the score for the activity.   Victorino Sparrow, LRT/CTRS         Victorino Sparrow A 06/15/2016 11:38 AM

## 2016-06-15 NOTE — Progress Notes (Addendum)
D: Pt presents with flat affect and depressed mood. Pt is attention seeking and intrusive. Pt has been coming to the nurse's station telling staff about other pt's care on the unit. Pt also telling other pt's on the unit what they need to do. Pt mood is labile. Pt constantly cursing and yelling at staff and then suddenly becomes apologetic. Pt approach towards staff is demanding and manipulating. Staff find pt to be difficult to redirect. Pt continues to ask if she can gather additional items out of her purse. Pt was allowed to gather belongings from her purse and locker on Monday.  Writer explained to pt the locker policy of not going back and forth into pt's locker for safety. Writer notified [facility] and it was decided that staff will not be going back into pt locker. Pt made aware by [facility]. A: Medications reviewed with pt. Medications administered as ordered per MD. Verbal support provided. Pt encouraged to attend groups. 15 minute checks performed for safety. Pt redirected as needed.  R: Pt attends group and compliant with meds.

## 2016-06-16 LAB — GLUCOSE, CAPILLARY
Glucose-Capillary: 108 mg/dL — ABNORMAL HIGH (ref 65–99)
Glucose-Capillary: 119 mg/dL — ABNORMAL HIGH (ref 65–99)
Glucose-Capillary: 207 mg/dL — ABNORMAL HIGH (ref 65–99)
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
## 2016-06-16 NOTE — Progress Notes (Signed)
Recreation Therapy Notes  Date: 06/16/16 Time: 1000 Location: [address] Dayroom  Group Topic: Communication, Team Building, Problem Solving  Goal Area(s) Addresses:  Patient will effectively work with peer towards shared goal.  Patient will identify skill used to make activity successful.  Patient will identify how skills used during activity can be used to reach post d/c goals.   Intervention: STEM Activity   Activity: [employer]. Patients were provided the following materials: 5 drinking straws, 5 rubber bands, 5 paper clips, 2 index cards, 2 drinking cups, and 2 toilet paper rolls. Using the provided materials patients were asked to build a launching mechanisms to launch a ping pong ball approximately 12 feet. Patients were divided into teams of 3-5.   Education: Education officer, community, Dentist.   Education Outcome: Acknowledges education/In group clarification offered/Needs additional education.   Clinical Observations/Feedback: Pt did not attend group.   Victorino Sparrow, LRT/CTRS         Ria Comment, Ryver Poblete A 06/16/2016 11:56 AM

## 2016-06-16 NOTE — Progress Notes (Addendum)
Pt. is irritable/agitated/argumentaive/loud with staff for not allowing daughter in law to come and pick-up belongings; for visitation hours have ended. Pt. states "I don't get it; It so simple. I need to pay my bills by midnight". 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 [place] notified. A.C. came and talk to Pt; allowing daughter to pick-up belongings. Will have Pt. and visitor sign belongings sheet. Pt. calmed down and proceeded to make a phone call.

## 2016-06-16 NOTE — Plan of Care (Signed)
Problem: Safety: Goal: Periods of time without injury will increase Outcome: Progressing Pt. remains a moderate falls risk, denies SI/HI/AVH at this time, Q 15 checks in effect.

## 2016-06-16 NOTE — Progress Notes (Signed)
Patient ID: Melanie Foster, female   DOB: [birth year], 61 y.o.   MRN: [id number] D   =---   Pt agrees to contract for safety.  She remains to herself most of time going through her " papers".  She sits to herself in hall bump out and has limited interaction with peers.  She is attention seeking and demanding and becomes irritated if she is not provided what she asks for.  Pt will split staff on every opportunity.  Individual time is spent with pt which she enjoys.  She said she plans to go back to school after DC.  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  Pt shows no sign of adverse effects to medications.  --- A ---  Provide support and safety  --- R ---  Pt remains safe on [address] hall

## 2016-06-16 NOTE — Progress Notes (Addendum)
  DATA ACTION RESPONSE  Objective- Pt. is visible in the dayroom, seen eating a snack. Presents with a irritable/sad affect and mood. Pt. gets easily agitated when requests are denied.  Subjective- Denies having any SI/HI/AVH at this time. Rates pain 7/10; Lower back. Remains safe on the unit.  1:1 interaction in private to establish rapport. Encouragement, education, & support given from staff.  PRN klonopin, oxycodone, and Ambien requested and will re-eval accordingly.   Safety maintained with Q 15 checks. Continue with POC.

## 2016-06-16 NOTE — Progress Notes (Signed)
Pt attend warp up group. Her day was a 6. Her goal find happiness with in her self did not accomplish goal. Staff remind pt she can write down thoughts concern and coping skills to help once she leave behavior health.

## 2016-06-16 NOTE — BHH Group Notes (Signed)
[place] LCSW Group Therapy  06/16/2016  1:05 PM  Type of Therapy:  Group therapy  Participation Level:  Active  Participation Quality:  Attentive  Affect:  Flat  Cognitive:  Oriented  Insight:  Limited  Engagement in Therapy:  Limited  Modes of Intervention:  Discussion, Socialization  Summary of Progress/Problems:  Chaplain was here to lead a group on themes of hope and courage.Invited.  In bed asleep.  Melanie Foster 06/16/2016 1:54 PM

## 2016-06-16 NOTE — Progress Notes (Signed)
Pt.'s daughter in-law came and paid bill for Melanie Foster provided with her consent. No belongings was sent home.

## 2016-06-17 LAB — GLUCOSE, CAPILLARY
Glucose-Capillary: 103 mg/dL — ABNORMAL HIGH (ref 65–99)
Glucose-Capillary: 167 mg/dL — ABNORMAL HIGH (ref 65–99)
Glucose-Capillary: 121 mg/dL — ABNORMAL HIGH (ref 65–99)
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
## 2016-06-17 NOTE — BHH Group Notes (Signed)
[place] LCSW Group Therapy  06/17/2016   Type of Therapy:  Group Therapy  Participation Level:  Active  Participation Quality:  Appropriate and Attentive  Affect:  Appropriate  Cognitive:  Alert and Oriented  Insight:  Improving  Engagement in Therapy:  Improving  Modes of Intervention:  Discussion   Today's group was about using different tools to prepare for successful discharge. Facilitator identified three major areas to review. One was supports at discharge. Another area was utilizing coping skills to manage mood and behavior at discharge. Finally identifying goals in order to have clear directive for ongoing progress. Patients were able to engage will and identify how to develop these key tools for a good discharge. Patient identified limited/no supports and expressed opposition to support. Patient was pleasant but had difficulty identifying appropriate resource pool for herself.  Christene Lye MSW, LCSW

## 2016-06-17 NOTE — Progress Notes (Signed)
[place] Group Notes:  (Nursing/MHT/Case Management/Adjunct)  Date:  06/17/2016  Time:  9:01 PM  Type of Therapy:  Psychoeducational Skills  Participation Level:  Minimal  Participation Quality:  Attentive  Affect:  Flat  Cognitive:  Lacking  Insight:  Limited  Engagement in Group:  Resistant  Modes of Intervention:  Education  Summary of Progress/Problems: The patient shared in group that she worked on a "picture" today but would not divulge any additional details. Her goal for tomorrow is to get discharged.   Melenda Bielak S 06/17/2016, 9:01 PM

## 2016-06-17 NOTE — Progress Notes (Signed)
D:  Patient has fair sleep, sleep medication helpful.  Fair appetite, low energy level, poor concentration.  Rated depression and hopeless 6, anxiety 8.  Denied withdrawals.  Denied SI.  Physical problems, rash, headaches.  Physical pain, neck, legs, back.  Pain medication helpful.   Goal is happiness.  Try to be positive about situation.  Would like to talk to SW for assistance.  No discharge plans. A:  Medications administered per MD orders.  Emotional support and encouragement given patient. R:  Patient denied SI and HI, contracts for safety.  Denied A/V hallucinations.  Safety maintained with 15 minute checks.
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
## 2016-06-17 NOTE — Plan of Care (Signed)
Problem: Coping: Goal: Ability to cope will improve Outcome: Progressing Discussed depression/anxiety/coping skills with patient.

## 2016-06-18 LAB — GLUCOSE, CAPILLARY
GLUCOSE-CAPILLARY: 104 mg/dL — AB (ref 65–99)
GLUCOSE-CAPILLARY: 130 mg/dL — AB (ref 65–99)
GLUCOSE-CAPILLARY: 170 mg/dL — AB (ref 65–99)
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
## 2016-06-18 NOTE — [facility] Group Notes (Signed)
[facility] LCSW Group Therapy  06/18/2016 10 AM  Type of Therapy:  Group Therapy  Participation Level:  Did Not Attend; invited to participate yet did not despite overhead announcement and encouragement by staff    Sheilah Pigeon, LCSW

## 2016-06-18 NOTE — Progress Notes (Signed)
D:  Patient's self inventory sheet, patient has fair sleep, sleep medication helpful.  Poor appetite, low energy level, good concentration.  Rated depression 4, hopeless 2, anxiety 6.  Denied withdrawals.  Denied SI.  Physical problems. Rash, pain.  Physical pain, legs, knees, neck, pain medication helpful.  Goal is "inner peace".  Plans to not let small things become out of portion.  No discharge plans. A:  Medications administered per MD orders.  Emotional support and encouragement given patient. R:  Patient denied SI and HI, contracts for safety.  Denied A/V hallucinations.  Safety maintained with 15 minute checks.

## 2016-06-18 NOTE — Progress Notes (Signed)
D: Pt at the time of assessment endorsed moderate anxiety depression and chronic generalized body pain; "I hurt everywhere." Pt denied SI, HI or AVH. Pt was observed in the dayroom inaction with peers and staffs. Pt could be a little forward and intrusive. A: Medications offered as prescribed. All patient's questions and concerns addressed. Support, encouragement, and safe environment provided. 15-minute safety checks continue. R: Pt was med compliant.  Pt attended wrap-up group. Safety checks continue.
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
## 2016-06-18 NOTE — Progress Notes (Signed)
[facility] Group Notes:  (Nursing/MHT/Case Management/Adjunct)  Date:  06/18/2016  Time:  9:30 PM  Type of Therapy:  Psychoeducational Skills  Participation Level:  Active  Participation Quality:  Redirectable  Affect:  Irritable  Cognitive:  Appropriate  Insight:  Appropriate  Engagement in Group:  Distracting and Resistant  Modes of Intervention:  Education  Summary of Progress/Problems: The patient shared with the group that she had a good day and was able to socialize with her peers. She required redirection as she attempted to hurry her peers in group if they took too long to share or were off topic. Her goal for tomorrow is to get discharged.  Cherisse Carrell S 06/18/2016, 9:30 PM

## 2016-06-18 NOTE — Plan of Care (Signed)
Problem: Coping: Goal: Ability to cope will improve Outcome: Progressing Nurse discussed depression/anxiety/coping skills with patient.    

## 2016-06-19 ENCOUNTER — Encounter (HOSPITAL_COMMUNITY): Payer: Self-pay | Admitting: Psychiatry

## 2016-06-19 LAB — GLUCOSE, CAPILLARY: Glucose-Capillary: 106 mg/dL — ABNORMAL HIGH (ref 65–99)

## 2016-06-19 MED ORDER — VERAPAMIL HCL ER 120 MG PO TBCR: 120.0000 mg | EXTENDED_RELEASE_TABLET | Freq: Every day | ORAL | 0 refills | Status: DC

## 2016-06-19 MED ORDER — LUBIPROSTONE 24 MCG PO CAPS: 24.0000 ug | ORAL_CAPSULE | Freq: Two times a day (BID) | ORAL | 0 refills | Status: DC

## 2016-06-19 MED ORDER — BUPROPION HCL ER (XL) 150 MG PO TB24: 150.0000 mg | ORAL_TABLET | Freq: Every day | ORAL | 0 refills | Status: DC

## 2016-06-19 MED ORDER — QUETIAPINE FUMARATE ER 50 MG PO TB24: 350.0000 mg | ORAL_TABLET | Freq: Every day | ORAL | 0 refills | Status: DC

## 2016-06-19 MED ORDER — ZOLPIDEM TARTRATE 10 MG PO TABS: 10.0000 mg | ORAL_TABLET | Freq: Every evening | ORAL | 0 refills | Status: DC | PRN

## 2016-06-19 MED ORDER — RIVAROXABAN 20 MG PO TABS: 20.0000 mg | ORAL_TABLET | Freq: Every day | ORAL | 0 refills | Status: AC

## 2016-06-19 MED ORDER — PRAVASTATIN SODIUM 10 MG PO TABS: 10.0000 mg | ORAL_TABLET | Freq: Every day | ORAL | 0 refills | Status: AC

## 2016-06-19 MED ORDER — PRAZOSIN HCL 1 MG PO CAPS: 1.0000 mg | ORAL_CAPSULE | Freq: Every day | ORAL | 0 refills | Status: DC

## 2016-06-19 MED ORDER — NICOTINE 21 MG/24HR TD PT24: 21.0000 mg | MEDICATED_PATCH | Freq: Every day | TRANSDERMAL | 0 refills | Status: AC

## 2016-06-19 MED ORDER — DOXYCYCLINE HYCLATE 100 MG PO TABS: 100.0000 mg | ORAL_TABLET | Freq: Two times a day (BID) | ORAL | 0 refills | Status: DC

## 2016-06-19 MED ORDER — DULOXETINE HCL 60 MG PO CPEP: 60.0000 mg | ORAL_CAPSULE | Freq: Every evening | ORAL | 0 refills | Status: DC

## 2016-06-19 MED ORDER — HYDROXYZINE HCL 25 MG PO TABS: 25.0000 mg | ORAL_TABLET | Freq: Four times a day (QID) | ORAL | 0 refills | Status: AC | PRN

## 2016-06-19 MED ORDER — PREGABALIN 150 MG PO CAPS: 150.0000 mg | ORAL_CAPSULE | Freq: Three times a day (TID) | ORAL | 0 refills | Status: DC

## 2016-06-19 NOTE — Progress Notes (Signed)
D: Pt at the time of assessment endorsed moderate anxiety depression and chronic neck pain. Pt denied SI, HI or AVH. Pt is very intrusive. Pt stayed on the phone longer than usual because of a recent medical emergency; "my aunt just had a stroke while in the hospital." I is hoping to leave in AM. A: Medications offered as prescribed. All patient's questions and concerns addressed. Support, encouragement, and safe environment provided. 15-minute safety checks continue. R: Pt was med compliant.  Pt attended wrap-up group. Safety checks continue.
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
## 2016-06-19 NOTE — Progress Notes (Signed)
Discharge Note:  Patient discharged home with family member.  Nurse talked to son who stated they will take good care of his mother.  Nurse explained paper work, discharge information to son, and prescriptions for patient. Patient denied SI and HI.  Denied A/V hallucinations.  All suicide prevention information given and discussed with patient who stated she understood and had no questions.  Patient stated she received all her belongings, clothing, toiletries, hearing aid, prescriptions, black pocketbook with all her belongings.  Patient signed discharge belonging sheet with MHT present who also signed as witness.   Patient asked for cab first thing this morning, then she said her family was coming to pick her up.  Then she stated she did not tell staff that her family was coming to pick her up.  Nurse talked to son and verified transportation for patient.   Patient stated she appreciated all assistance received from [facility] staff.

## 2016-06-19 NOTE — Plan of Care (Signed)
[facility] Participation in Recreation Therapeutic Interventions Goal: STG-Patient will identify at least five coping skills for ** STG: Coping Skills - Patient will be able to identify at least 5 coping skills for suicide by conclusion of recreation therapy tx  Outcome: Completed/Met Date Met: 06/19/16 Pt was able to identify coping skills during coping skills recreation therapy session.  Victorino Sparrow, LRT/CTRS

## 2016-06-19 NOTE — Progress Notes (Signed)
D:  Patient's self inventory sheet, patient sleeps good, sleep medication helpful.  Good appetite, low energy level, good concentration.  Rated depression 7, hopeless 4, anxiety 10.  Denied withdrawals.  Denied SI.  Physical problems, pain, worst pain #6 in past 24 hours, legs, back, neck.  Goal is discharge.  Plans to talk to MD.  Does have discharge plans. A:  Medications administered per MD orders.  Emotional support and encouragement. R:  Denied SI and HI, contracts for safety.  Safety maintained with 15 minute checks.

## 2016-06-19 NOTE — Progress Notes (Signed)
  [facility] Adult Case Management Discharge Plan :  Will you be returning to the same living situation after discharge:  Yes,  home At discharge, do you have transportation home?: Yes,  son Do you have the ability to pay for your medications: Yes,  MCD  Release of information consent forms completed and in the chart;  Patient's signature needed at discharge.  Patient to Follow up at: Follow-up Information    [facility] Follow up on 06/21/2016.   Specialty:  Family Medicine Why:  Wednesday at 4:00 for your hospital follow up appointment Contact information: [address] [place] [address] [phone]           Next level of care provider has access to [place] and Suicide Prevention discussed: Yes,  yes  Have you used any form of tobacco in the last 30 days? (Cigarettes, Smokeless Tobacco, Cigars, and/or Pipes): Yes  Has patient been referred to the Quitline?: Patient refused referral  Patient has been referred for addiction treatment: [place] 06/19/2016, 4:57 PM

## 2016-06-19 NOTE — Progress Notes (Signed)
Recreation Therapy Notes  Date: 06/19/16 Time: 1000 Location: [address] Dayroom  Group Topic: Coping Skills  Goal Area(s) Addresses:  Patients will be able to identify coping skills. Patients will be able to identify the importance of coping skills. Patients will be able to identify the importance of coping skills post d/c.  Behavioral Response: Engaged  Intervention: Mind map, pencils  Activity: Mind map.  Patients were given a mind map to fill out.  LRT and patients filled in the first 8 boxes together (discipline, school, drugs, health, family, job, relationships and money).  Patients were to then come up with coping skills for each situation on their own.  The group then came back together and LRT filled in the coping skills on the board.  Patients would then fill in any holes they had on their sheets.  Education: Radiographer, therapeutic, Dentist.   Education Outcome: Acknowledges understanding/In group clarification offered/Needs additional education.   Clinical Observations/Feedback: Pt was engaged.  Pt started filling out her sheet.  Pt left early for discharge.    Victorino Sparrow, LRT/CTRS         Ria Comment, Jorgeluis Gurganus A 06/19/2016 1:18 PM

## 2016-06-19 NOTE — BHH Suicide Risk Assessment (Signed)
[facility] Discharge Suicide Risk Assessment   Principal Problem: MDD (major depressive disorder), recurrent severe, without psychosis ([place]) Discharge Diagnoses:  Patient Active Problem List   Diagnosis Date Noted  . Chronic pain syndrome [G89.4] 06/10/2016  . MDD (major depressive disorder), recurrent severe, without psychosis ([place]) [F33.2] 06/09/2016  . Left pulmonary embolus ([place]) [I26.99] 04/26/2016  . Abnormal urinalysis [R82.90] 04/26/2016  . Diabetes mellitus with complication ([place]) [K02.5]   . Helicobacter pylori gastritis [K29.70, B96.81] 02/08/2016  . Bipolar disorder, mixed, severe, w/o psychotic features ([place]) [F31.63] 09/16/2015  . Cocaine use disorder, severe, dependence ([place]) [F14.20] 09/16/2015  . Cannabis use disorder, mild, abuse [F12.10] 09/16/2015  . Polysubstance dependence ([place]) [F19.20] 09/15/2015  . Suicidal ideation [R45.851] 07/14/2015  . Hypertension [I10] 07/14/2015  . Drug overdose [T50.901A] 07/14/2015  . Marijuana abuse [F12.10] 07/14/2015  . Chronic back pain [M54.9, G89.29] 07/14/2015  . Suicide attempt ([place]) [T14.91XA]   . Herpes simplex [B00.9] 04/28/2015  . Tobacco abuse [Z72.0] 04/28/2015  . Chronic bronchitis ([place]) [J42] 04/28/2015  . DM neuropathy, type II diabetes mellitus ([place]) [E11.40] 04/28/2015  . Hyperlipidemia [E78.5] 04/22/2015  . Type 2 diabetes mellitus with hyperglycemia ([place]) [E11.65] 04/22/2015  . Hypothyroidism [E03.9] 04/22/2015    Total Time spent with patient: 30 minutes  Musculoskeletal: Strength & Muscle Tone: within normal limits Gait & Station: normal Patient leans: N/A  Psychiatric Specialty Exam: Review of Systems  Psychiatric/Behavioral: Negative for depression, hallucinations and suicidal ideas.  All other systems reviewed and are negative.   Blood pressure 112/76, pulse 94, temperature 98.4 F (36.9 C), temperature source Oral, resp. rate 20, height 5' 4.17" (1.63 m), weight 83 kg (183 lb), SpO2 99 %.Body mass  index is 31.24 kg/m.  General Appearance: Casual  Eye Contact::  Fair  Speech:  Clear and Coherent409  Volume:  Normal  Mood:  Euthymic  Affect:  Appropriate  Thought Process:  Goal Directed and Descriptions of Associations: Intact  Orientation:  Full (Time, Place, and Person)  Thought Content:  Logical  Suicidal Thoughts:  No  Homicidal Thoughts:  No  Memory:  Immediate;   Fair Recent;   Fair Remote;   Fair  Judgement:  Fair  Insight:  Fair  Psychomotor Activity:  Normal  Concentration:  Fair  Recall:  [employer] of Knowledge:Fair  Language: Fair  Akathisia:  No  Handed:  Right  AIMS (if indicated):     Assets:  Communication Skills Desire for Improvement  Sleep:  Number of Hours: 5  Cognition: WNL  ADL's:  Intact   Mental Status Per Nursing Assessment::   On Admission:  Suicidal ideation indicated by patient, Suicide plan, Plan includes specific time, place, or method, Self-harm thoughts, Self-harm behaviors, Intention to act on suicide plan  Demographic Factors:  NA  Loss Factors: NA  Historical Factors: Impulsivity  Risk Reduction Factors:   Positive therapeutic relationship  Continued Clinical Symptoms:  Previous Psychiatric Diagnoses and Treatments Medical Diagnoses and Treatments/Surgeries  Cognitive Features That Contribute To Risk:  None    Suicide Risk:  Minimal: No identifiable suicidal ideation.  Patients presenting with no risk factors but with morbid ruminations; may be classified as minimal risk based on the severity of the depressive symptoms  Follow-up Information    [facility] Follow up on 06/21/2016.   Specialty:  Family Medicine Why:  Wednesday at 4:00 for your hospital follow up appointment Contact information: [address] [place] [address] [phone]  Plan Of Care/Follow-up recommendations:  Activity:  no restrictions Diet:  regular Tests:  as needed Other:   follow up with aftercare  Walt Geathers, MD 06/19/2016, 9:49 AM

## 2016-07-11 ENCOUNTER — Telehealth: Payer: Self-pay | Admitting: Internal Medicine

## 2016-07-11 ENCOUNTER — Ambulatory Visit: Payer: Self-pay | Admitting: Internal Medicine

## 2016-07-11 NOTE — Telephone Encounter (Signed)
No charge per Dr. Gessner. 

## 2016-07-20 ENCOUNTER — Other Ambulatory Visit: Payer: Self-pay | Admitting: Specialist

## 2016-07-20 DIAGNOSIS — Z1231 Encounter for screening mammogram for malignant neoplasm of breast: Secondary | ICD-10-CM

## 2016-07-28 ENCOUNTER — Ambulatory Visit: Payer: Self-pay

## 2016-08-29 ENCOUNTER — Ambulatory Visit: Payer: Self-pay

## 2016-10-21 ENCOUNTER — Inpatient Hospital Stay (HOSPITAL_COMMUNITY)
Admission: EM | Admit: 2016-10-21 | Discharge: 2016-11-02 | DRG: 917 | Disposition: A | Discharge status: Other Acute Inpatient Hospital | Payer: Medicare Other | Attending: Internal Medicine | Admitting: Internal Medicine

## 2016-10-21 ENCOUNTER — Emergency Department (HOSPITAL_COMMUNITY): Payer: Medicare Other

## 2016-10-21 ENCOUNTER — Encounter (HOSPITAL_COMMUNITY): Payer: Self-pay

## 2016-10-21 DIAGNOSIS — K219 Gastro-esophageal reflux disease without esophagitis: Secondary | ICD-10-CM | POA: Diagnosis present

## 2016-10-21 DIAGNOSIS — T50902A Poisoning by unspecified drugs, medicaments and biological substances, intentional self-harm, initial encounter: Principal | ICD-10-CM | POA: Diagnosis present

## 2016-10-21 DIAGNOSIS — I10 Essential (primary) hypertension: Secondary | ICD-10-CM | POA: Diagnosis present

## 2016-10-21 DIAGNOSIS — R45851 Suicidal ideations: Secondary | ICD-10-CM

## 2016-10-21 DIAGNOSIS — Z23 Encounter for immunization: Secondary | ICD-10-CM

## 2016-10-21 DIAGNOSIS — G9340 Encephalopathy, unspecified: Secondary | ICD-10-CM

## 2016-10-21 DIAGNOSIS — J441 Chronic obstructive pulmonary disease with (acute) exacerbation: Secondary | ICD-10-CM | POA: Diagnosis present

## 2016-10-21 DIAGNOSIS — E1165 Type 2 diabetes mellitus with hyperglycemia: Secondary | ICD-10-CM

## 2016-10-21 DIAGNOSIS — G8929 Other chronic pain: Secondary | ICD-10-CM | POA: Diagnosis present

## 2016-10-21 DIAGNOSIS — R0602 Shortness of breath: Secondary | ICD-10-CM

## 2016-10-21 DIAGNOSIS — G92 Toxic encephalopathy: Secondary | ICD-10-CM | POA: Diagnosis present

## 2016-10-21 DIAGNOSIS — G4700 Insomnia, unspecified: Secondary | ICD-10-CM | POA: Diagnosis not present

## 2016-10-21 DIAGNOSIS — J42 Unspecified chronic bronchitis: Secondary | ICD-10-CM | POA: Diagnosis present

## 2016-10-21 DIAGNOSIS — E039 Hypothyroidism, unspecified: Secondary | ICD-10-CM | POA: Diagnosis present

## 2016-10-21 DIAGNOSIS — E7800 Pure hypercholesterolemia, unspecified: Secondary | ICD-10-CM | POA: Diagnosis present

## 2016-10-21 DIAGNOSIS — Z86711 Personal history of pulmonary embolism: Secondary | ICD-10-CM

## 2016-10-21 DIAGNOSIS — E119 Type 2 diabetes mellitus without complications: Secondary | ICD-10-CM | POA: Diagnosis present

## 2016-10-21 DIAGNOSIS — M797 Fibromyalgia: Secondary | ICD-10-CM | POA: Diagnosis present

## 2016-10-21 DIAGNOSIS — D72829 Elevated white blood cell count, unspecified: Secondary | ICD-10-CM | POA: Diagnosis present

## 2016-10-21 DIAGNOSIS — J209 Acute bronchitis, unspecified: Secondary | ICD-10-CM | POA: Diagnosis present

## 2016-10-21 DIAGNOSIS — T6594XA Toxic effect of unspecified substance, undetermined, initial encounter: Secondary | ICD-10-CM

## 2016-10-21 DIAGNOSIS — R000 Tachycardia, unspecified: Secondary | ICD-10-CM | POA: Diagnosis present

## 2016-10-21 DIAGNOSIS — T380X5A Adverse effect of glucocorticoids and synthetic analogues, initial encounter: Secondary | ICD-10-CM | POA: Diagnosis present

## 2016-10-21 DIAGNOSIS — E118 Type 2 diabetes mellitus with unspecified complications: Secondary | ICD-10-CM | POA: Diagnosis present

## 2016-10-21 DIAGNOSIS — Z7984 Long term (current) use of oral hypoglycemic drugs: Secondary | ICD-10-CM

## 2016-10-21 DIAGNOSIS — F1910 Other psychoactive substance abuse, uncomplicated: Secondary | ICD-10-CM | POA: Diagnosis present

## 2016-10-21 DIAGNOSIS — Z79899 Other long term (current) drug therapy: Secondary | ICD-10-CM

## 2016-10-21 DIAGNOSIS — G894 Chronic pain syndrome: Secondary | ICD-10-CM | POA: Diagnosis present

## 2016-10-21 DIAGNOSIS — M549 Dorsalgia, unspecified: Secondary | ICD-10-CM | POA: Diagnosis present

## 2016-10-21 DIAGNOSIS — F3163 Bipolar disorder, current episode mixed, severe, without psychotic features: Secondary | ICD-10-CM | POA: Diagnosis present

## 2016-10-21 DIAGNOSIS — E876 Hypokalemia: Secondary | ICD-10-CM | POA: Diagnosis present

## 2016-10-21 DIAGNOSIS — Y92410 Unspecified street and highway as the place of occurrence of the external cause: Secondary | ICD-10-CM

## 2016-10-21 DIAGNOSIS — F17210 Nicotine dependence, cigarettes, uncomplicated: Secondary | ICD-10-CM | POA: Diagnosis present

## 2016-10-21 DIAGNOSIS — T1491XA Suicide attempt, initial encounter: Secondary | ICD-10-CM | POA: Diagnosis present

## 2016-10-21 DIAGNOSIS — J440 Chronic obstructive pulmonary disease with acute lower respiratory infection: Secondary | ICD-10-CM | POA: Diagnosis present

## 2016-10-21 DIAGNOSIS — M8580 Other specified disorders of bone density and structure, unspecified site: Secondary | ICD-10-CM | POA: Diagnosis present

## 2016-10-21 DIAGNOSIS — T50901A Poisoning by unspecified drugs, medicaments and biological substances, accidental (unintentional), initial encounter: Secondary | ICD-10-CM | POA: Diagnosis present

## 2016-10-21 DIAGNOSIS — F419 Anxiety disorder, unspecified: Secondary | ICD-10-CM | POA: Diagnosis present

## 2016-10-21 DIAGNOSIS — Z7901 Long term (current) use of anticoagulants: Secondary | ICD-10-CM

## 2016-10-21 DIAGNOSIS — R400 Somnolence: Secondary | ICD-10-CM

## 2016-10-21 DIAGNOSIS — Z811 Family history of alcohol abuse and dependence: Secondary | ICD-10-CM

## 2016-10-21 LAB — URINALYSIS, ROUTINE W REFLEX MICROSCOPIC
GLUCOSE, UA: NEGATIVE mg/dL
Hgb urine dipstick: NEGATIVE
Ketones, ur: 15 mg/dL — AB
LEUKOCYTES UA: NEGATIVE
NITRITE: NEGATIVE
PH: 5.5 (ref 5.0–8.0)
Protein, ur: 30 mg/dL — AB
Specific Gravity, Urine: >1.03 — ABNORMAL HIGH (ref 1.005–1.030)

## 2016-10-21 LAB — COMPREHENSIVE METABOLIC PANEL
ALT: 21 U/L (ref 14–54)
AST: 27 U/L (ref 15–41)
Albumin: 3.9 g/dL (ref 3.5–5.0)
Alkaline Phosphatase: 89 U/L (ref 38–126)
Anion gap: 11 (ref 5–15)
BILIRUBIN TOTAL: 1 mg/dL (ref 0.3–1.2)
BUN: 6 mg/dL (ref 6–20)
CHLORIDE: 102 mmol/L (ref 101–111)
CO2: 23 mmol/L (ref 22–32)
CREATININE: 0.65 mg/dL (ref 0.44–1.00)
Calcium: 9.5 mg/dL (ref 8.9–10.3)
Glucose, Bld: 178 mg/dL — ABNORMAL HIGH (ref 65–99)
POTASSIUM: 3.3 mmol/L — AB (ref 3.5–5.1)
SODIUM: 136 mmol/L (ref 135–145)
Total Protein: 7.5 g/dL (ref 6.5–8.1)

## 2016-10-21 LAB — SALICYLATE LEVEL

## 2016-10-21 LAB — CBC
HCT: 41.2 % (ref 36.0–46.0)
Hemoglobin: 13.7 g/dL (ref 12.0–15.0)
MCH: 28.8 pg (ref 26.0–34.0)
MCHC: 33.3 g/dL (ref 30.0–36.0)
MCV: 86.7 fL (ref 78.0–100.0)
PLATELETS: 377 10*3/uL (ref 150–400)
RBC: 4.75 MIL/uL (ref 3.87–5.11)
RDW: 15.4 % (ref 11.5–15.5)
WBC: 13 10*3/uL — AB (ref 4.0–10.5)

## 2016-10-21 LAB — RAPID URINE DRUG SCREEN, HOSP PERFORMED
AMPHETAMINES: NOT DETECTED
Barbiturates: NOT DETECTED
Benzodiazepines: POSITIVE — AB
Cocaine: POSITIVE — AB
Opiates: POSITIVE — AB
Tetrahydrocannabinol: NOT DETECTED

## 2016-10-21 LAB — GLUCOSE, CAPILLARY
GLUCOSE-CAPILLARY: 160 mg/dL — AB (ref 65–99)
Glucose-Capillary: 213 mg/dL — ABNORMAL HIGH (ref 65–99)
Glucose-Capillary: 88 mg/dL (ref 65–99)
Glucose-Capillary: 218 mg/dL — ABNORMAL HIGH (ref 65–99)

## 2016-10-21 LAB — URINALYSIS, MICROSCOPIC (REFLEX)
Bacteria, UA: NONE SEEN
RBC / HPF: NONE SEEN RBC/hpf (ref 0–5)
SQUAMOUS EPITHELIAL / LPF: NONE SEEN

## 2016-10-21 LAB — ACETAMINOPHEN LEVEL: Acetaminophen (Tylenol), Serum: <10 ug/mL — ABNORMAL LOW (ref 10–30)

## 2016-10-21 LAB — I-STAT CHEM 8, ED
BUN: 5 mg/dL — AB (ref 6–20)
CALCIUM ION: 1.13 mmol/L — AB (ref 1.15–1.40)
CHLORIDE: 103 mmol/L (ref 101–111)
CREATININE: 0.6 mg/dL (ref 0.44–1.00)
Glucose, Bld: 178 mg/dL — ABNORMAL HIGH (ref 65–99)
HEMATOCRIT: 41 % (ref 36.0–46.0)
Hemoglobin: 13.9 g/dL (ref 12.0–15.0)
Potassium: 3.4 mmol/L — ABNORMAL LOW (ref 3.5–5.1)
SODIUM: 139 mmol/L (ref 135–145)
TCO2: 27 mmol/L (ref 22–32)

## 2016-10-21 LAB — ETHANOL

## 2016-10-21 LAB — HEMOGLOBIN A1C
Hgb A1c MFr Bld: 7.6 % — ABNORMAL HIGH (ref 4.8–5.6)
Mean Plasma Glucose: 171.42 mg/dL

## 2016-10-21 LAB — CBG MONITORING, ED: GLUCOSE-CAPILLARY: 165 mg/dL — AB (ref 65–99)

## 2016-10-21 LAB — SAMPLE TO BLOOD BANK

## 2016-10-21 LAB — CDS SEROLOGY

## 2016-10-21 LAB — TSH: TSH: 0.738 u[IU]/mL (ref 0.350–4.500)

## 2016-10-21 LAB — PROTIME-INR
INR: 0.98
PROTHROMBIN TIME: 12.9 s (ref 11.4–15.2)

## 2016-10-21 LAB — I-STAT CG4 LACTIC ACID, ED: Lactic Acid, Venous: 1.77 mmol/L (ref 0.5–1.9)

## 2016-10-21 LAB — MRSA PCR SCREENING: MRSA BY PCR: NEGATIVE

## 2016-10-21 MED ORDER — PRAZOSIN HCL 1 MG PO CAPS
1.0000 mg | ORAL_CAPSULE | Freq: Every day | ORAL | Status: DC
  Administered 2016-10-21 – 2016-11-01 (×11): 1 mg via ORAL
  Filled 2016-10-21 (×13): qty 1

## 2016-10-21 MED ORDER — INSULIN ASPART 100 UNIT/ML ~~LOC~~ SOLN
0.0000 [IU] | Freq: Three times a day (TID) | SUBCUTANEOUS | Status: DC
  Administered 2016-10-21: 3 [IU] via SUBCUTANEOUS
  Administered 2016-10-21 – 2016-10-22 (×2): 2 [IU] via SUBCUTANEOUS
  Administered 2016-10-22 (×2): 5 [IU] via SUBCUTANEOUS

## 2016-10-21 MED ORDER — RIVAROXABAN 20 MG PO TABS: 20.0000 mg | ORAL_TABLET | Freq: Every day | ORAL | Status: DC

## 2016-10-21 MED ORDER — ONDANSETRON HCL 4 MG PO TABS
4.0000 mg | ORAL_TABLET | Freq: Four times a day (QID) | ORAL | Status: DC | PRN
  Administered 2016-10-28 – 2016-10-29 (×3): 4 mg via ORAL
  Filled 2016-10-21 (×4): qty 1

## 2016-10-21 MED ORDER — NICOTINE 21 MG/24HR TD PT24
21.0000 mg | MEDICATED_PATCH | Freq: Every day | TRANSDERMAL | Status: DC
  Administered 2016-10-21 – 2016-11-02 (×13): 21 mg via TRANSDERMAL
  Filled 2016-10-21 (×13): qty 1

## 2016-10-21 MED ORDER — NALOXONE HCL 2 MG/2ML IJ SOSY
1.0000 mg | PREFILLED_SYRINGE | Freq: Once | INTRAMUSCULAR | Status: AC
  Administered 2016-10-21: 1 mg via INTRAVENOUS
  Filled 2016-10-21: qty 2

## 2016-10-21 MED ORDER — ALBUTEROL SULFATE (2.5 MG/3ML) 0.083% IN NEBU: 2.5000 mg | INHALATION_SOLUTION | Freq: Four times a day (QID) | RESPIRATORY_TRACT | Status: DC | PRN

## 2016-10-21 MED ORDER — VERAPAMIL HCL ER 120 MG PO TBCR
120.0000 mg | EXTENDED_RELEASE_TABLET | Freq: Every day | ORAL | Status: DC
  Administered 2016-10-22 – 2016-10-31 (×9): 120 mg via ORAL
  Filled 2016-10-21 (×11): qty 1

## 2016-10-21 MED ORDER — INSULIN ASPART 100 UNIT/ML ~~LOC~~ SOLN
0.0000 [IU] | Freq: Every day | SUBCUTANEOUS | Status: DC
  Administered 2016-10-21: 2 [IU] via SUBCUTANEOUS
  Administered 2016-10-22: 3 [IU] via SUBCUTANEOUS

## 2016-10-21 MED ORDER — GUAIFENESIN 100 MG/5ML PO SOLN
10.0000 mL | ORAL | Status: DC | PRN
  Administered 2016-10-21 – 2016-10-24 (×3): 200 mg via ORAL
  Filled 2016-10-21: qty 10
  Filled 2016-10-21: qty 5
  Filled 2016-10-21: qty 10

## 2016-10-21 MED ORDER — SODIUM CHLORIDE 0.9 % IV BOLUS (SEPSIS)
125.0000 mL | Freq: Once | INTRAVENOUS | Status: AC
  Administered 2016-10-21: 125 mL via INTRAVENOUS

## 2016-10-21 MED ORDER — MAGNESIUM SULFATE 2 GM/50ML IV SOLN
2.0000 g | Freq: Once | INTRAVENOUS | Status: AC
  Administered 2016-10-21: 2 g via INTRAVENOUS
  Filled 2016-10-21: qty 50

## 2016-10-21 MED ORDER — ENOXAPARIN SODIUM 100 MG/ML ~~LOC~~ SOLN
85.0000 mg | Freq: Two times a day (BID) | SUBCUTANEOUS | Status: DC
  Administered 2016-10-21 – 2016-10-22 (×2): 85 mg via SUBCUTANEOUS
  Filled 2016-10-21 (×2): qty 1

## 2016-10-21 MED ORDER — HYDRALAZINE HCL 20 MG/ML IJ SOLN: 5.0000 mg | Freq: Four times a day (QID) | INTRAMUSCULAR | Status: DC | PRN

## 2016-10-21 MED ORDER — ALBUTEROL SULFATE (2.5 MG/3ML) 0.083% IN NEBU: 2.5000 mg | INHALATION_SOLUTION | RESPIRATORY_TRACT | Status: DC | PRN

## 2016-10-21 MED ORDER — LEVOTHYROXINE SODIUM 25 MCG PO TABS
25.0000 ug | ORAL_TABLET | Freq: Every day | ORAL | Status: DC
  Administered 2016-10-22 – 2016-11-02 (×12): 25 ug via ORAL
  Filled 2016-10-21 (×12): qty 1

## 2016-10-21 MED ORDER — GUAIFENESIN ER 600 MG PO TB12
600.0000 mg | ORAL_TABLET | Freq: Two times a day (BID) | ORAL | Status: DC
  Administered 2016-10-21: 600 mg via ORAL
  Filled 2016-10-21: qty 1

## 2016-10-21 MED ORDER — IPRATROPIUM-ALBUTEROL 0.5-2.5 (3) MG/3ML IN SOLN
3.0000 mL | Freq: Once | RESPIRATORY_TRACT | Status: AC
  Administered 2016-10-21: 3 mL via RESPIRATORY_TRACT
  Filled 2016-10-21: qty 3

## 2016-10-21 MED ORDER — PRAVASTATIN SODIUM 20 MG PO TABS
10.0000 mg | ORAL_TABLET | Freq: Every day | ORAL | Status: DC
  Administered 2016-10-21 – 2016-11-01 (×12): 10 mg via ORAL
  Filled 2016-10-21 (×13): qty 1

## 2016-10-21 MED ORDER — VERAPAMIL HCL 2.5 MG/ML IV SOLN: 2.5000 mg | Freq: Once | INTRAVENOUS | Status: DC

## 2016-10-21 MED ORDER — SODIUM CHLORIDE 0.9 % IV SOLN
INTRAVENOUS | Status: DC
  Administered 2016-10-21: 07:00:00 via INTRAVENOUS

## 2016-10-21 MED ORDER — ACETAMINOPHEN 650 MG RE SUPP: 650.0000 mg | Freq: Four times a day (QID) | RECTAL | Status: DC | PRN

## 2016-10-21 MED ORDER — ACETAMINOPHEN 325 MG PO TABS
650.0000 mg | ORAL_TABLET | Freq: Four times a day (QID) | ORAL | Status: DC | PRN
  Administered 2016-10-21 – 2016-10-27 (×4): 650 mg via ORAL
  Filled 2016-10-21 (×5): qty 2

## 2016-10-21 MED ORDER — SENNOSIDES-DOCUSATE SODIUM 8.6-50 MG PO TABS
1.0000 | ORAL_TABLET | Freq: Every evening | ORAL | Status: DC | PRN
  Administered 2016-10-28 – 2016-11-02 (×4): 1 via ORAL
  Filled 2016-10-21 (×4): qty 1

## 2016-10-21 MED ORDER — ONDANSETRON HCL 4 MG/2ML IJ SOLN
4.0000 mg | Freq: Four times a day (QID) | INTRAMUSCULAR | Status: DC | PRN
  Administered 2016-10-27: 4 mg via INTRAVENOUS
  Filled 2016-10-21: qty 2

## 2016-10-21 MED ORDER — DEXTROSE 5 % IV SOLN
500.0000 mg | INTRAVENOUS | Status: DC
  Administered 2016-10-21 – 2016-10-23 (×3): 500 mg via INTRAVENOUS
  Filled 2016-10-21 (×4): qty 500
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
## 2016-10-21 NOTE — ED Notes (Signed)
Patient transported to CT and xray 
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
## 2016-10-22 ENCOUNTER — Observation Stay (HOSPITAL_COMMUNITY): Payer: Medicare Other

## 2016-10-22 DIAGNOSIS — F17210 Nicotine dependence, cigarettes, uncomplicated: Secondary | ICD-10-CM | POA: Diagnosis present

## 2016-10-22 DIAGNOSIS — F419 Anxiety disorder, unspecified: Secondary | ICD-10-CM | POA: Diagnosis present

## 2016-10-22 DIAGNOSIS — E039 Hypothyroidism, unspecified: Secondary | ICD-10-CM | POA: Diagnosis present

## 2016-10-22 DIAGNOSIS — T50902A Poisoning by unspecified drugs, medicaments and biological substances, intentional self-harm, initial encounter: Secondary | ICD-10-CM | POA: Diagnosis present

## 2016-10-22 DIAGNOSIS — F3163 Bipolar disorder, current episode mixed, severe, without psychotic features: Secondary | ICD-10-CM

## 2016-10-22 DIAGNOSIS — J440 Chronic obstructive pulmonary disease with acute lower respiratory infection: Secondary | ICD-10-CM | POA: Diagnosis present

## 2016-10-22 DIAGNOSIS — I10 Essential (primary) hypertension: Secondary | ICD-10-CM

## 2016-10-22 DIAGNOSIS — E119 Type 2 diabetes mellitus without complications: Secondary | ICD-10-CM | POA: Diagnosis present

## 2016-10-22 DIAGNOSIS — E118 Type 2 diabetes mellitus with unspecified complications: Secondary | ICD-10-CM

## 2016-10-22 DIAGNOSIS — K219 Gastro-esophageal reflux disease without esophagitis: Secondary | ICD-10-CM | POA: Diagnosis present

## 2016-10-22 DIAGNOSIS — E876 Hypokalemia: Secondary | ICD-10-CM | POA: Diagnosis present

## 2016-10-22 DIAGNOSIS — T50902D Poisoning by unspecified drugs, medicaments and biological substances, intentional self-harm, subsequent encounter: Secondary | ICD-10-CM

## 2016-10-22 DIAGNOSIS — Z86711 Personal history of pulmonary embolism: Secondary | ICD-10-CM | POA: Diagnosis not present

## 2016-10-22 DIAGNOSIS — Y92410 Unspecified street and highway as the place of occurrence of the external cause: Secondary | ICD-10-CM | POA: Diagnosis not present

## 2016-10-22 DIAGNOSIS — T1491XA Suicide attempt, initial encounter: Secondary | ICD-10-CM

## 2016-10-22 DIAGNOSIS — R000 Tachycardia, unspecified: Secondary | ICD-10-CM

## 2016-10-22 DIAGNOSIS — G4700 Insomnia, unspecified: Secondary | ICD-10-CM | POA: Diagnosis not present

## 2016-10-22 DIAGNOSIS — Z23 Encounter for immunization: Secondary | ICD-10-CM | POA: Diagnosis present

## 2016-10-22 DIAGNOSIS — M797 Fibromyalgia: Secondary | ICD-10-CM | POA: Diagnosis present

## 2016-10-22 DIAGNOSIS — J441 Chronic obstructive pulmonary disease with (acute) exacerbation: Secondary | ICD-10-CM | POA: Diagnosis present

## 2016-10-22 DIAGNOSIS — R0602 Shortness of breath: Secondary | ICD-10-CM

## 2016-10-22 DIAGNOSIS — G92 Toxic encephalopathy: Secondary | ICD-10-CM | POA: Diagnosis present

## 2016-10-22 DIAGNOSIS — M549 Dorsalgia, unspecified: Secondary | ICD-10-CM | POA: Diagnosis present

## 2016-10-22 DIAGNOSIS — J42 Unspecified chronic bronchitis: Secondary | ICD-10-CM

## 2016-10-22 DIAGNOSIS — G894 Chronic pain syndrome: Secondary | ICD-10-CM | POA: Diagnosis present

## 2016-10-22 DIAGNOSIS — J209 Acute bronchitis, unspecified: Secondary | ICD-10-CM | POA: Diagnosis present

## 2016-10-22 DIAGNOSIS — T380X5A Adverse effect of glucocorticoids and synthetic analogues, initial encounter: Secondary | ICD-10-CM | POA: Diagnosis present

## 2016-10-22 DIAGNOSIS — F1910 Other psychoactive substance abuse, uncomplicated: Secondary | ICD-10-CM | POA: Diagnosis present

## 2016-10-22 DIAGNOSIS — M8580 Other specified disorders of bone density and structure, unspecified site: Secondary | ICD-10-CM | POA: Diagnosis present

## 2016-10-22 DIAGNOSIS — T6594XA Toxic effect of unspecified substance, undetermined, initial encounter: Secondary | ICD-10-CM

## 2016-10-22 DIAGNOSIS — D72829 Elevated white blood cell count, unspecified: Secondary | ICD-10-CM | POA: Diagnosis present

## 2016-10-22 LAB — RESPIRATORY PANEL BY PCR
ADENOVIRUS-RVPPCR: NOT DETECTED
Bordetella pertussis: NOT DETECTED
CHLAMYDOPHILA PNEUMONIAE-RVPPCR: NOT DETECTED
CORONAVIRUS 229E-RVPPCR: NOT DETECTED
CORONAVIRUS NL63-RVPPCR: NOT DETECTED
CORONAVIRUS OC43-RVPPCR: NOT DETECTED
Coronavirus HKU1: NOT DETECTED
Influenza A: NOT DETECTED
Influenza B: NOT DETECTED
MYCOPLASMA PNEUMONIAE-RVPPCR: NOT DETECTED
Metapneumovirus: NOT DETECTED
PARAINFLUENZA VIRUS 1-RVPPCR: NOT DETECTED
PARAINFLUENZA VIRUS 4-RVPPCR: NOT DETECTED
Parainfluenza Virus 2: NOT DETECTED
Parainfluenza Virus 3: NOT DETECTED
Respiratory Syncytial Virus: NOT DETECTED
Rhinovirus / Enterovirus: DETECTED — AB

## 2016-10-22 LAB — BASIC METABOLIC PANEL
ANION GAP: 8 (ref 5–15)
CHLORIDE: 109 mmol/L (ref 101–111)
CO2: 23 mmol/L (ref 22–32)
Calcium: 8 mg/dL — ABNORMAL LOW (ref 8.9–10.3)
Creatinine, Ser: 0.6 mg/dL (ref 0.44–1.00)
GFR calc Af Amer: >60 mL/min (ref >60)
GLUCOSE: 113 mg/dL — AB (ref 65–99)
POTASSIUM: 2.9 mmol/L — AB (ref 3.5–5.1)
Sodium: 140 mmol/L (ref 135–145)

## 2016-10-22 LAB — GLUCOSE, CAPILLARY
GLUCOSE-CAPILLARY: 243 mg/dL — AB (ref 65–99)
GLUCOSE-CAPILLARY: 277 mg/dL — AB (ref 65–99)
GLUCOSE-CAPILLARY: 254 mg/dL — AB (ref 65–99)
Glucose-Capillary: 183 mg/dL — ABNORMAL HIGH (ref 65–99)

## 2016-10-22 LAB — CBC
HEMATOCRIT: 35.9 % — AB (ref 36.0–46.0)
HEMOGLOBIN: 11.3 g/dL — AB (ref 12.0–15.0)
MCH: 27.6 pg (ref 26.0–34.0)
MCHC: 31.5 g/dL (ref 30.0–36.0)
MCV: 87.6 fL (ref 78.0–100.0)
Platelets: 375 10*3/uL (ref 150–400)
RBC: 4.1 MIL/uL (ref 3.87–5.11)
RDW: 15.6 % — AB (ref 11.5–15.5)
WBC: 9.3 10*3/uL (ref 4.0–10.5)

## 2016-10-22 LAB — INFLUENZA PANEL BY PCR (TYPE A & B)
INFLBPCR: NEGATIVE
Influenza A By PCR: NEGATIVE

## 2016-10-22 LAB — PHOSPHORUS: Phosphorus: 2.9 mg/dL (ref 2.5–4.6)

## 2016-10-22 LAB — MAGNESIUM: Magnesium: 2 mg/dL (ref 1.7–2.4)

## 2016-10-22 MED ORDER — BUPROPION HCL ER (XL) 150 MG PO TB24
150.0000 mg | ORAL_TABLET | Freq: Every day | ORAL | Status: DC
  Administered 2016-10-22 – 2016-11-02 (×12): 150 mg via ORAL
  Filled 2016-10-22 (×12): qty 1

## 2016-10-22 MED ORDER — RIVAROXABAN 20 MG PO TABS
20.0000 mg | ORAL_TABLET | Freq: Every day | ORAL | Status: DC
  Administered 2016-10-22 – 2016-11-02 (×12): 20 mg via ORAL
  Filled 2016-10-22 (×12): qty 1

## 2016-10-22 MED ORDER — IPRATROPIUM-ALBUTEROL 0.5-2.5 (3) MG/3ML IN SOLN
3.0000 mL | Freq: Four times a day (QID) | RESPIRATORY_TRACT | Status: DC
  Administered 2016-10-22 (×2): 3 mL via RESPIRATORY_TRACT
  Filled 2016-10-22 (×2): qty 3

## 2016-10-22 MED ORDER — GUAIFENESIN ER 600 MG PO TB12
1200.0000 mg | ORAL_TABLET | Freq: Two times a day (BID) | ORAL | Status: DC
  Administered 2016-10-22 – 2016-11-02 (×23): 1200 mg via ORAL
  Filled 2016-10-22 (×23): qty 2

## 2016-10-22 MED ORDER — POTASSIUM CHLORIDE 10 MEQ/100ML IV SOLN
10.0000 meq | INTRAVENOUS | Status: DC
  Administered 2016-10-22: 10 meq via INTRAVENOUS
  Filled 2016-10-22 (×2): qty 100

## 2016-10-22 MED ORDER — KETOROLAC TROMETHAMINE 30 MG/ML IJ SOLN
30.0000 mg | Freq: Three times a day (TID) | INTRAMUSCULAR | Status: AC | PRN
  Administered 2016-10-22 – 2016-10-23 (×4): 30 mg via INTRAVENOUS
  Filled 2016-10-22 (×4): qty 1

## 2016-10-22 MED ORDER — DULOXETINE HCL 60 MG PO CPEP
60.0000 mg | ORAL_CAPSULE | Freq: Every evening | ORAL | Status: DC
  Administered 2016-10-22 – 2016-11-02 (×12): 60 mg via ORAL
  Filled 2016-10-22 (×13): qty 1

## 2016-10-22 MED ORDER — LIDOCAINE 5 % EX PTCH
1.0000 | MEDICATED_PATCH | CUTANEOUS | Status: DC
  Administered 2016-10-22 – 2016-10-23 (×2): 1 via TRANSDERMAL
  Filled 2016-10-22 (×7): qty 1

## 2016-10-22 MED ORDER — HYDROXYZINE HCL 25 MG PO TABS
25.0000 mg | ORAL_TABLET | Freq: Four times a day (QID) | ORAL | Status: DC | PRN
  Administered 2016-10-29 – 2016-11-02 (×9): 25 mg via ORAL
  Filled 2016-10-22 (×9): qty 1

## 2016-10-22 MED ORDER — POTASSIUM CHLORIDE IN NACL 40-0.9 MEQ/L-% IV SOLN
INTRAVENOUS | Status: DC
  Administered 2016-10-22: 100 mL/h via INTRAVENOUS
  Filled 2016-10-22 (×2): qty 1000

## 2016-10-22 MED ORDER — POTASSIUM CHLORIDE CRYS ER 20 MEQ PO TBCR
40.0000 meq | EXTENDED_RELEASE_TABLET | Freq: Two times a day (BID) | ORAL | Status: AC
  Administered 2016-10-22 (×2): 40 meq via ORAL
  Filled 2016-10-22 (×2): qty 2

## 2016-10-22 MED ORDER — METHYLPREDNISOLONE SODIUM SUCC 125 MG IJ SOLR
60.0000 mg | Freq: Two times a day (BID) | INTRAMUSCULAR | Status: DC
  Administered 2016-10-22 (×2): 60 mg via INTRAVENOUS
  Filled 2016-10-22 (×3): qty 2
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
## 2016-10-23 ENCOUNTER — Inpatient Hospital Stay (HOSPITAL_COMMUNITY): Payer: Medicare Other

## 2016-10-23 DIAGNOSIS — D473 Essential (hemorrhagic) thrombocythemia: Secondary | ICD-10-CM

## 2016-10-23 DIAGNOSIS — G8929 Other chronic pain: Secondary | ICD-10-CM

## 2016-10-23 DIAGNOSIS — M545 Low back pain: Secondary | ICD-10-CM

## 2016-10-23 LAB — CBC WITH DIFFERENTIAL/PLATELET
BASOS PCT: 0 %
Basophils Absolute: 0 10*3/uL (ref 0.0–0.1)
EOS ABS: 0 10*3/uL (ref 0.0–0.7)
EOS PCT: 0 %
HEMATOCRIT: 35.4 % — AB (ref 36.0–46.0)
Hemoglobin: 11.3 g/dL — ABNORMAL LOW (ref 12.0–15.0)
Lymphocytes Relative: 13 %
Lymphs Abs: 1 10*3/uL (ref 0.7–4.0)
MCH: 27.7 pg (ref 26.0–34.0)
MCHC: 31.9 g/dL (ref 30.0–36.0)
MCV: 86.8 fL (ref 78.0–100.0)
MONO ABS: 0.3 10*3/uL (ref 0.1–1.0)
MONOS PCT: 4 %
Neutro Abs: 6.5 10*3/uL (ref 1.7–7.7)
Neutrophils Relative %: 83 %
PLATELETS: 421 10*3/uL — AB (ref 150–400)
RBC: 4.08 MIL/uL (ref 3.87–5.11)
RDW: 15.5 % (ref 11.5–15.5)
WBC: 7.7 10*3/uL (ref 4.0–10.5)

## 2016-10-23 LAB — GLUCOSE, CAPILLARY
GLUCOSE-CAPILLARY: 233 mg/dL — AB (ref 65–99)
Glucose-Capillary: 241 mg/dL — ABNORMAL HIGH (ref 65–99)
Glucose-Capillary: 324 mg/dL — ABNORMAL HIGH (ref 65–99)
Glucose-Capillary: 295 mg/dL — ABNORMAL HIGH (ref 65–99)

## 2016-10-23 LAB — COMPREHENSIVE METABOLIC PANEL
ALBUMIN: 3 g/dL — AB (ref 3.5–5.0)
ALT: 39 U/L (ref 14–54)
ANION GAP: 7 (ref 5–15)
AST: 50 U/L — ABNORMAL HIGH (ref 15–41)
Alkaline Phosphatase: 72 U/L (ref 38–126)
BILIRUBIN TOTAL: 0.8 mg/dL (ref 0.3–1.2)
BUN: 7 mg/dL (ref 6–20)
CHLORIDE: 108 mmol/L (ref 101–111)
CO2: 21 mmol/L — ABNORMAL LOW (ref 22–32)
Calcium: 8.6 mg/dL — ABNORMAL LOW (ref 8.9–10.3)
Creatinine, Ser: 0.64 mg/dL (ref 0.44–1.00)
GFR calc Af Amer: >60 mL/min (ref >60)
GFR calc non Af Amer: >60 mL/min (ref >60)
GLUCOSE: 237 mg/dL — AB (ref 65–99)
POTASSIUM: 4.8 mmol/L (ref 3.5–5.1)
SODIUM: 136 mmol/L (ref 135–145)
TOTAL PROTEIN: 6.6 g/dL (ref 6.5–8.1)

## 2016-10-23 LAB — MAGNESIUM: MAGNESIUM: 1.9 mg/dL (ref 1.7–2.4)

## 2016-10-23 LAB — PHOSPHORUS
PHOSPHORUS: 3 mg/dL (ref 2.5–4.6)
Phosphorus: 1.5 mg/dL — ABNORMAL LOW (ref 2.5–4.6)

## 2016-10-23 MED ORDER — DICLOFENAC SODIUM 1 % TD GEL
2.0000 g | Freq: Four times a day (QID) | TRANSDERMAL | Status: DC
  Administered 2016-10-23 – 2016-11-01 (×3): 2 g via TOPICAL
  Filled 2016-10-23 (×2): qty 100

## 2016-10-23 MED ORDER — METHYLPREDNISOLONE SODIUM SUCC 40 MG IJ SOLR
40.0000 mg | Freq: Two times a day (BID) | INTRAMUSCULAR | Status: DC
  Administered 2016-10-23 – 2016-10-24 (×3): 40 mg via INTRAVENOUS
  Filled 2016-10-23 (×3): qty 1

## 2016-10-23 MED ORDER — INSULIN ASPART 100 UNIT/ML ~~LOC~~ SOLN
4.0000 [IU] | Freq: Three times a day (TID) | SUBCUTANEOUS | Status: DC
  Administered 2016-10-24 – 2016-10-29 (×10): 4 [IU] via SUBCUTANEOUS

## 2016-10-23 MED ORDER — INSULIN ASPART 100 UNIT/ML ~~LOC~~ SOLN
0.0000 [IU] | Freq: Every day | SUBCUTANEOUS | Status: DC
Start: 2016-10-23 — End: 2016-11-02
  Administered 2016-10-23 – 2016-10-24 (×2): 3 [IU] via SUBCUTANEOUS
  Administered 2016-10-30: 2 [IU] via SUBCUTANEOUS

## 2016-10-23 MED ORDER — IPRATROPIUM-ALBUTEROL 0.5-2.5 (3) MG/3ML IN SOLN
3.0000 mL | Freq: Three times a day (TID) | RESPIRATORY_TRACT | Status: DC
  Administered 2016-10-23 – 2016-10-24 (×4): 3 mL via RESPIRATORY_TRACT
  Filled 2016-10-23 (×4): qty 3

## 2016-10-23 MED ORDER — SODIUM CHLORIDE 0.9 % IV SOLN
INTRAVENOUS | Status: DC
  Administered 2016-10-23 – 2016-10-24 (×2): via INTRAVENOUS

## 2016-10-23 MED ORDER — INSULIN GLARGINE 100 UNIT/ML ~~LOC~~ SOLN
7.0000 [IU] | Freq: Every day | SUBCUTANEOUS | Status: DC
  Administered 2016-10-23: 7 [IU] via SUBCUTANEOUS
  Filled 2016-10-23 (×2): qty 0.07

## 2016-10-23 MED ORDER — SODIUM PHOSPHATES 45 MMOLE/15ML IV SOLN: 10.0000 mmol | Freq: Once | INTRAVENOUS | Status: DC

## 2016-10-23 MED ORDER — INSULIN ASPART 100 UNIT/ML ~~LOC~~ SOLN
0.0000 [IU] | Freq: Three times a day (TID) | SUBCUTANEOUS | Status: DC
  Administered 2016-10-23: 5 [IU] via SUBCUTANEOUS
  Administered 2016-10-23: 7 [IU] via SUBCUTANEOUS
  Administered 2016-10-24: 11 [IU] via SUBCUTANEOUS
  Administered 2016-10-24: 8 [IU] via SUBCUTANEOUS
  Administered 2016-10-24 – 2016-10-25 (×2): 5 [IU] via SUBCUTANEOUS
  Administered 2016-10-26: 2 [IU] via SUBCUTANEOUS
  Administered 2016-10-26: 11 [IU] via SUBCUTANEOUS
  Administered 2016-10-27: 2 [IU] via SUBCUTANEOUS
  Administered 2016-10-27 – 2016-10-28 (×2): 5 [IU] via SUBCUTANEOUS
  Administered 2016-10-30 (×2): 2 [IU] via SUBCUTANEOUS
  Administered 2016-10-31 – 2016-11-01 (×4): 3 [IU] via SUBCUTANEOUS
  Administered 2016-11-02: 2 [IU] via SUBCUTANEOUS
  Administered 2016-11-02: 5 [IU] via SUBCUTANEOUS

## 2016-10-23 MED ORDER — SODIUM PHOSPHATES 45 MMOLE/15ML IV SOLN
30.0000 mmol | Freq: Once | INTRAVENOUS | Status: AC
  Administered 2016-10-23: 30 mmol via INTRAVENOUS
  Filled 2016-10-23: qty 10

## 2016-10-23 MED ORDER — METHYLPREDNISOLONE SODIUM SUCC 40 MG IJ SOLR
20.0000 mg | Freq: Once | INTRAMUSCULAR | Status: AC
  Administered 2016-10-23: 20 mg via INTRAVENOUS
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
## 2016-10-24 LAB — COMPREHENSIVE METABOLIC PANEL
ALBUMIN: 2.8 g/dL — AB (ref 3.5–5.0)
ALK PHOS: 66 U/L (ref 38–126)
ALT: 32 U/L (ref 14–54)
AST: 24 U/L (ref 15–41)
Anion gap: 7 (ref 5–15)
BUN: 16 mg/dL (ref 6–20)
CALCIUM: 8.7 mg/dL — AB (ref 8.9–10.3)
CO2: 22 mmol/L (ref 22–32)
CREATININE: 0.7 mg/dL (ref 0.44–1.00)
Chloride: 108 mmol/L (ref 101–111)
GFR calc non Af Amer: >60 mL/min (ref >60)
GLUCOSE: 277 mg/dL — AB (ref 65–99)
Potassium: 4.6 mmol/L (ref 3.5–5.1)
SODIUM: 137 mmol/L (ref 135–145)
Total Bilirubin: 0.6 mg/dL (ref 0.3–1.2)
Total Protein: 5.9 g/dL — ABNORMAL LOW (ref 6.5–8.1)

## 2016-10-24 LAB — CBC WITH DIFFERENTIAL/PLATELET
BASOS ABS: 0 10*3/uL (ref 0.0–0.1)
Basophils Relative: 0 %
EOS ABS: 0 10*3/uL (ref 0.0–0.7)
Eosinophils Relative: 0 %
HCT: 31.4 % — ABNORMAL LOW (ref 36.0–46.0)
HEMOGLOBIN: 10 g/dL — AB (ref 12.0–15.0)
LYMPHS ABS: 1 10*3/uL (ref 0.7–4.0)
Lymphocytes Relative: 7 %
MCH: 27.5 pg (ref 26.0–34.0)
MCHC: 31.8 g/dL (ref 30.0–36.0)
MCV: 86.3 fL (ref 78.0–100.0)
Monocytes Absolute: 0.7 10*3/uL (ref 0.1–1.0)
Monocytes Relative: 5 %
NEUTROS PCT: 88 %
Neutro Abs: 12.5 10*3/uL — ABNORMAL HIGH (ref 1.7–7.7)
Platelets: 416 10*3/uL — ABNORMAL HIGH (ref 150–400)
RBC: 3.64 MIL/uL — AB (ref 3.87–5.11)
RDW: 15.5 % (ref 11.5–15.5)
WBC: 14.2 10*3/uL — AB (ref 4.0–10.5)

## 2016-10-24 LAB — GLUCOSE, CAPILLARY
GLUCOSE-CAPILLARY: 240 mg/dL — AB (ref 65–99)
GLUCOSE-CAPILLARY: 316 mg/dL — AB (ref 65–99)
GLUCOSE-CAPILLARY: 258 mg/dL — AB (ref 65–99)
Glucose-Capillary: 261 mg/dL — ABNORMAL HIGH (ref 65–99)

## 2016-10-24 LAB — MAGNESIUM: Magnesium: 1.9 mg/dL (ref 1.7–2.4)

## 2016-10-24 MED ORDER — PREDNISONE 20 MG PO TABS
40.0000 mg | ORAL_TABLET | Freq: Every day | ORAL | Status: AC
  Administered 2016-10-26: 40 mg via ORAL
  Filled 2016-10-24: qty 2

## 2016-10-24 MED ORDER — AZITHROMYCIN 500 MG PO TABS
500.0000 mg | ORAL_TABLET | Freq: Every day | ORAL | Status: AC
  Administered 2016-10-24 – 2016-10-25 (×2): 500 mg via ORAL
  Filled 2016-10-24 (×2): qty 1

## 2016-10-24 MED ORDER — INSULIN GLARGINE 100 UNIT/ML ~~LOC~~ SOLN
15.0000 [IU] | Freq: Every day | SUBCUTANEOUS | Status: DC
  Administered 2016-10-24 – 2016-10-28 (×5): 15 [IU] via SUBCUTANEOUS
  Filled 2016-10-24 (×5): qty 0.15

## 2016-10-24 MED ORDER — IPRATROPIUM-ALBUTEROL 0.5-2.5 (3) MG/3ML IN SOLN: 3.0000 mL | Freq: Four times a day (QID) | RESPIRATORY_TRACT | Status: DC | PRN

## 2016-10-24 MED ORDER — KETOROLAC TROMETHAMINE 30 MG/ML IJ SOLN
30.0000 mg | Freq: Three times a day (TID) | INTRAMUSCULAR | Status: DC | PRN
  Administered 2016-10-24 – 2016-10-26 (×7): 30 mg via INTRAVENOUS
  Filled 2016-10-24 (×7): qty 1

## 2016-10-24 NOTE — Progress Notes (Signed)
Inpatient Diabetes Program Recommendations  AACE/ADA: New Consensus Statement on Inpatient Glycemic Control (2015)  Target Ranges:  Prepandial:   less than 140 mg/dL      Peak postprandial:   less than 180 mg/dL (1-2 hours)      Critically ill patients:  140 - 180 mg/dL   Lab Results  Component Value Date   GLUCAP 261 (H) 10/24/2016   HGBA1C 7.6 (H) 10/21/2016    Review of Glycemic Control Results for Melanie Foster, Melanie Foster (MRN [id number]) as of 10/24/2016 10:03  Ref. Range 10/23/2016 07:47 10/23/2016 11:38 10/23/2016 16:06 10/23/2016 21:22 10/24/2016 09:12  Glucose-Capillary Latest Ref Range: 65 - 99 mg/dL 233 (H) 241 (H) 324 (H) 295 (H) 261 (H)   Diabetes history: DM2 Outpatient Diabetes medications: Metformin 500 qd Current orders for Inpatient glycemic control: Lantus 7 units daily + Novolog 4 units tid meal coverage + Novolog moderate correction scale + hs  Inpatient Diabetes Program Recommendations:    Please consider: -Increase Lantus to 15 units daily Will follow during hospitalization.  Thank you, Nani Gasser. Cassity Christian, RN, MSN, CDE  Diabetes Coordinator Inpatient Glycemic Control Team Team Pager [phone] (8am-5pm) 10/24/2016 10:05 AM
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
## 2016-10-25 DIAGNOSIS — T50902A Poisoning by unspecified drugs, medicaments and biological substances, intentional self-harm, initial encounter: Principal | ICD-10-CM

## 2016-10-25 LAB — COMPREHENSIVE METABOLIC PANEL
ALBUMIN: 2.8 g/dL — AB (ref 3.5–5.0)
ALK PHOS: 55 U/L (ref 38–126)
ALT: 29 U/L (ref 14–54)
AST: 21 U/L (ref 15–41)
Anion gap: 6 (ref 5–15)
BILIRUBIN TOTAL: 0.5 mg/dL (ref 0.3–1.2)
BUN: 13 mg/dL (ref 6–20)
CO2: 25 mmol/L (ref 22–32)
CREATININE: 0.71 mg/dL (ref 0.44–1.00)
Calcium: 9 mg/dL (ref 8.9–10.3)
Chloride: 106 mmol/L (ref 101–111)
GFR calc Af Amer: >60 mL/min (ref >60)
GLUCOSE: 104 mg/dL — AB (ref 65–99)
POTASSIUM: 3.6 mmol/L (ref 3.5–5.1)
Sodium: 137 mmol/L (ref 135–145)
TOTAL PROTEIN: 5.6 g/dL — AB (ref 6.5–8.1)

## 2016-10-25 LAB — GLUCOSE, CAPILLARY
GLUCOSE-CAPILLARY: 206 mg/dL — AB (ref 65–99)
GLUCOSE-CAPILLARY: 169 mg/dL — AB (ref 65–99)
Glucose-Capillary: 92 mg/dL (ref 65–99)
Glucose-Capillary: 126 mg/dL — ABNORMAL HIGH (ref 65–99)
Glucose-Capillary: 76 mg/dL (ref 65–99)

## 2016-10-25 LAB — CBC WITH DIFFERENTIAL/PLATELET
BASOS ABS: 0 10*3/uL (ref 0.0–0.1)
BASOS PCT: 0 %
Eosinophils Absolute: 0 10*3/uL (ref 0.0–0.7)
Eosinophils Relative: 0 %
HEMATOCRIT: 29.7 % — AB (ref 36.0–46.0)
HEMOGLOBIN: 9.6 g/dL — AB (ref 12.0–15.0)
LYMPHS PCT: 20 %
Lymphs Abs: 3.9 10*3/uL (ref 0.7–4.0)
MCH: 27.7 pg (ref 26.0–34.0)
MCHC: 32.3 g/dL (ref 30.0–36.0)
MCV: 85.6 fL (ref 78.0–100.0)
MONO ABS: 2 10*3/uL — AB (ref 0.1–1.0)
Monocytes Relative: 10 %
NEUTROS ABS: 13.3 10*3/uL — AB (ref 1.7–7.7)
NEUTROS PCT: 70 %
Platelets: 350 10*3/uL (ref 150–400)
RBC: 3.47 MIL/uL — AB (ref 3.87–5.11)
RDW: 14.9 % (ref 11.5–15.5)
WBC: 19.2 10*3/uL — AB (ref 4.0–10.5)

## 2016-10-25 LAB — PHOSPHORUS: Phosphorus: 3.3 mg/dL (ref 2.5–4.6)

## 2016-10-25 LAB — MAGNESIUM: MAGNESIUM: 1.8 mg/dL (ref 1.7–2.4)

## 2016-10-25 MED ORDER — QUETIAPINE FUMARATE ER 300 MG PO TB24
300.0000 mg | ORAL_TABLET | Freq: Every day | ORAL | Status: DC
  Administered 2016-10-25 – 2016-11-01 (×8): 300 mg via ORAL
  Filled 2016-10-25 (×9): qty 1

## 2016-10-25 MED ORDER — CLONAZEPAM 1 MG PO TABS
1.0000 mg | ORAL_TABLET | Freq: Every day | ORAL | Status: DC
  Administered 2016-10-25 – 2016-10-29 (×5): 1 mg via ORAL
  Filled 2016-10-25 (×5): qty 1
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
## 2016-10-25 NOTE — Progress Notes (Signed)
OT Cancellation Note  Patient Details Name: Melanie Foster MRN: [id number] DOB: 08/17/1955   Cancelled Treatment:    Reason Eval/Treat Not Completed: Patient declined, no reason specified. Pt declined to participate despite max education. Will check back as able to progress with POC.    Norman Herrlich, MS OTR/L  Pager: [place] [facility] 10/25/2016, 5:00 PM

## 2016-10-25 NOTE — Progress Notes (Signed)
Pt arrived to floor. Pt made comfortable and callbell within reach. Will continue to assess.

## 2016-10-26 LAB — CULTURE, BLOOD (ROUTINE X 2)
Culture: NO GROWTH
Culture: NO GROWTH
Special Requests: ADEQUATE
Special Requests: ADEQUATE

## 2016-10-26 LAB — GLUCOSE, CAPILLARY
GLUCOSE-CAPILLARY: 182 mg/dL — AB (ref 65–99)
Glucose-Capillary: 87 mg/dL (ref 65–99)
Glucose-Capillary: 122 mg/dL — ABNORMAL HIGH (ref 65–99)
Glucose-Capillary: 302 mg/dL — ABNORMAL HIGH (ref 65–99)

## 2016-10-26 MED ORDER — OXYCODONE-ACETAMINOPHEN 7.5-325 MG PO TABS
1.0000 | ORAL_TABLET | Freq: Four times a day (QID) | ORAL | Status: DC | PRN
  Administered 2016-10-26 – 2016-11-02 (×20): 1 via ORAL
  Filled 2016-10-26 (×20): qty 1
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
## 2016-10-26 NOTE — Progress Notes (Signed)
OT Cancellation Note  Patient Details Name: Melanie Foster MRN: [id number] DOB: 03/06/1955   Cancelled Treatment:    Reason Eval/Treat Not Completed: Other (comment). Pt reports she is worn out from shower and PT session that were back to back. Will re-attempt tx session at a later date.  Almon Register [phone] 10/26/2016, 2:22 PM
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
## 2016-10-26 NOTE — Progress Notes (Signed)
Central telemetry called this RN and said that the pt had a 9-beat run of V-tach. Pt assessed and asymptomatic. MD made aware and no new orders obtained. Will ctm.

## 2016-10-26 NOTE — Progress Notes (Addendum)
Clinical Social Worker following patient for inpatient psychiatric placement. CSW reached out to [facility] and unfortunately facility is unable to offer a bed to patient.    Rhea Pink, MSW,  [place]

## 2016-10-26 NOTE — Progress Notes (Signed)
CSW faxed referral to:  [place] [phone]

## 2016-10-27 LAB — GLUCOSE, CAPILLARY
GLUCOSE-CAPILLARY: 178 mg/dL — AB (ref 65–99)
Glucose-Capillary: 100 mg/dL — ABNORMAL HIGH (ref 65–99)
Glucose-Capillary: 124 mg/dL — ABNORMAL HIGH (ref 65–99)
Glucose-Capillary: 205 mg/dL — ABNORMAL HIGH (ref 65–99)

## 2016-10-27 MED ORDER — INFLUENZA VAC SPLIT QUAD 0.5 ML IM SUSY
0.5000 mL | PREFILLED_SYRINGE | INTRAMUSCULAR | Status: AC
  Administered 2016-10-28: 0.5 mL via INTRAMUSCULAR
  Filled 2016-10-27: qty 0.5

## 2016-10-27 MED ORDER — CYCLOBENZAPRINE HCL 10 MG PO TABS
10.0000 mg | ORAL_TABLET | Freq: Once | ORAL | Status: AC
  Administered 2016-10-27: 10 mg via ORAL
  Filled 2016-10-27: qty 1

## 2016-10-27 MED ORDER — KETOROLAC TROMETHAMINE 30 MG/ML IJ SOLN
30.0000 mg | Freq: Once | INTRAMUSCULAR | Status: AC
  Administered 2016-10-27: 30 mg via INTRAVENOUS
  Filled 2016-10-27: qty 1

## 2016-10-27 MED ORDER — OXYCODONE HCL 5 MG PO TABS
5.0000 mg | ORAL_TABLET | Freq: Once | ORAL | Status: AC
  Administered 2016-10-27: 5 mg via ORAL
  Filled 2016-10-27: qty 1

## 2016-10-27 NOTE — Discharge Instructions (Signed)
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
## 2016-10-27 NOTE — Progress Notes (Signed)
CSW followed up with [facility] psych facilities. They are still reviewing referral.   Neysa Hotter [phone]

## 2016-10-28 LAB — GLUCOSE, CAPILLARY
GLUCOSE-CAPILLARY: 89 mg/dL (ref 65–99)
GLUCOSE-CAPILLARY: 100 mg/dL — AB (ref 65–99)
GLUCOSE-CAPILLARY: 106 mg/dL — AB (ref 65–99)
Glucose-Capillary: 207 mg/dL — ABNORMAL HIGH (ref 65–99)
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
## 2016-10-28 NOTE — Progress Notes (Addendum)
[facility] believes patient would be better suited for their dual diagnosis program. They will forward to their MD.   CSW left message for [facility]. [facility] will call CSW back.   Percell Locus Courtnee Myer LCSWA [phone]
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
## 2016-10-29 LAB — CBC
HCT: 33 % — ABNORMAL LOW (ref 36.0–46.0)
Hemoglobin: 10.5 g/dL — ABNORMAL LOW (ref 12.0–15.0)
MCH: 28.5 pg (ref 26.0–34.0)
MCHC: 31.8 g/dL (ref 30.0–36.0)
MCV: 89.4 fL (ref 78.0–100.0)
PLATELETS: 391 10*3/uL (ref 150–400)
RBC: 3.69 MIL/uL — ABNORMAL LOW (ref 3.87–5.11)
RDW: 16 % — AB (ref 11.5–15.5)
WBC: 12 10*3/uL — ABNORMAL HIGH (ref 4.0–10.5)

## 2016-10-29 LAB — GLUCOSE, CAPILLARY
Glucose-Capillary: 92 mg/dL (ref 65–99)
Glucose-Capillary: 106 mg/dL — ABNORMAL HIGH (ref 65–99)
Glucose-Capillary: 171 mg/dL — ABNORMAL HIGH (ref 65–99)
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
## 2016-10-30 DIAGNOSIS — F1310 Sedative, hypnotic or anxiolytic abuse, uncomplicated: Secondary | ICD-10-CM

## 2016-10-30 DIAGNOSIS — F1210 Cannabis abuse, uncomplicated: Secondary | ICD-10-CM

## 2016-10-30 DIAGNOSIS — F419 Anxiety disorder, unspecified: Secondary | ICD-10-CM

## 2016-10-30 DIAGNOSIS — G4700 Insomnia, unspecified: Secondary | ICD-10-CM

## 2016-10-30 DIAGNOSIS — R450 Nervousness: Secondary | ICD-10-CM

## 2016-10-30 DIAGNOSIS — T450X2A Poisoning by antiallergic and antiemetic drugs, intentional self-harm, initial encounter: Secondary | ICD-10-CM

## 2016-10-30 DIAGNOSIS — F1411 Cocaine abuse, in remission: Secondary | ICD-10-CM

## 2016-10-30 LAB — GLUCOSE, CAPILLARY
GLUCOSE-CAPILLARY: 190 mg/dL — AB (ref 65–99)
GLUCOSE-CAPILLARY: 118 mg/dL — AB (ref 65–99)
GLUCOSE-CAPILLARY: 220 mg/dL — AB (ref 65–99)
Glucose-Capillary: 148 mg/dL — ABNORMAL HIGH (ref 65–99)
Glucose-Capillary: 141 mg/dL — ABNORMAL HIGH (ref 65–99)
Glucose-Capillary: 174 mg/dL — ABNORMAL HIGH (ref 65–99)

## 2016-10-30 MED ORDER — CLONAZEPAM 1 MG PO TABS
1.0000 mg | ORAL_TABLET | Freq: Two times a day (BID) | ORAL | Status: DC
  Administered 2016-10-30 – 2016-11-02 (×6): 1 mg via ORAL
  Filled 2016-10-30 (×6): qty 1
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
## 2016-10-30 NOTE — Progress Notes (Signed)
Meant to be 1400
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
## 2016-10-30 NOTE — Care Management Important Message (Signed)
Important Message  Patient Details  Name: Melanie Foster MRN: [id number] Date of Birth: [birth year]   Medicare Important Message Given:  Yes    Nathen May 10/30/2016, 11:31 AM

## 2016-10-31 LAB — GLUCOSE, CAPILLARY
GLUCOSE-CAPILLARY: 199 mg/dL — AB (ref 65–99)
GLUCOSE-CAPILLARY: 140 mg/dL — AB (ref 65–99)
Glucose-Capillary: 167 mg/dL — ABNORMAL HIGH (ref 65–99)
Glucose-Capillary: 76 mg/dL (ref 65–99)

## 2016-10-31 NOTE — Progress Notes (Signed)
CSW contacted [facility]. They state they cannot find the referral and their ED has been full. CSW faxed info again.   [facility] reports that patient would be better served in [place] due to her unsteadiness with walking.   CSW faxed updates to the [facility].   Percell Locus Lakima Dona LCSWA [phone]
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
## 2016-11-01 DIAGNOSIS — T50901D Poisoning by unspecified drugs, medicaments and biological substances, accidental (unintentional), subsequent encounter: Secondary | ICD-10-CM

## 2016-11-01 LAB — GLUCOSE, CAPILLARY
GLUCOSE-CAPILLARY: 181 mg/dL — AB (ref 65–99)
Glucose-Capillary: 100 mg/dL — ABNORMAL HIGH (ref 65–99)
Glucose-Capillary: 196 mg/dL — ABNORMAL HIGH (ref 65–99)
Glucose-Capillary: 154 mg/dL — ABNORMAL HIGH (ref 65–99)

## 2016-11-01 MED ORDER — FLUCONAZOLE 100 MG PO TABS
150.0000 mg | ORAL_TABLET | Freq: Once | ORAL | Status: AC
  Administered 2016-11-01: 150 mg via ORAL
  Filled 2016-11-01: qty 2

## 2016-11-01 MED ORDER — VERAPAMIL HCL ER 120 MG PO TBCR
60.0000 mg | EXTENDED_RELEASE_TABLET | Freq: Every day | ORAL | Status: DC
  Filled 2016-11-01: qty 0.5

## 2016-11-01 MED ORDER — CLOTRIMAZOLE 1 % VA CREA
1.0000 | TOPICAL_CREAM | Freq: Every day | VAGINAL | Status: DC
  Filled 2016-11-01: qty 45
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
## 2016-11-01 NOTE — Progress Notes (Addendum)
[facility] ED CSW received a call from [place] has been accepted by: [facility] in [place] Number for report is: [phone] ext [phone] Pt's unit/room/bed number will be: 62 Accepting physician: Dr. Narda Amber  Pt can arrive ASAP on 10/24  CSW will update RN.  6:24 PM ED CSW covering for [facility] called RN, RN will request D/C orders to be signed by provider and will call the CSW once pt is ready for D/C and CSW will call [place] for transportation.  CSW at pt's request called disposition at [facility] who spoke to admitting and confirmed pt is not appropriate for [facility] due to pt's need for assistance with ADL's and need for a cane and a 1 to 1 which [facility] cannot provide.    CSW will continue to follow for D/C needs.  7:06 PM Pt is in agreement with the plan to D/C to [facility] in [place].  CSW updated pt's RN  7:15 PM CSW updated RN who is stating pt is not now sure whether she will go or not.  CSW advised RN to update provider to find out whether provider wants to IVC pt or not.  CSW attempting to contact hospitalist on duty to inquire about IVC. CSW called hospitalist at ph: [phone] and updated hospitalist, hospitalist calling daytime provider and will call CSW back shortly.   CSW awaiting return call from hospitalist.  Alphonse Guild. Preston Weill, LCSW, LCAS, CSI Clinical Social Worker Ph: [phone]

## 2016-11-01 NOTE — Progress Notes (Addendum)
5:20pm-Called [facility] are reviewing referral. CSW provided them with nursing station # if they have medical questions tonight.   8:20am-CSW received vm from [facility] in [place] stating to let them know if patient needs placement. CSW called them back and they said they would have to look for the referral and call CSW back.   Percell Locus Casidee Jann LCSWA [phone]

## 2016-11-01 NOTE — Progress Notes (Addendum)
CSW staffed with CSW Asst Director who is aware provider's plan is, per notes, to re-assess and/or discharge the pt in the morning.  CSW Asst Director stated social work will await the provider's decision on 10/25.  2nd shift ED CSW will leave handoff for daytime CSW,  Please reconsult if future social work needs arise.  CSW signing off, as social work intervention is no longer needed.  Alphonse Guild. Yvette Roark, LCSW, LCAS, CSI Clinical Social Worker Ph: [phone]

## 2016-11-01 NOTE — Progress Notes (Signed)
Received call from [facility] at [facility] that there is a bed available around 1820. Paged MD about getting discharge orders. CSW on call notified. Patient very upset and agitated, stating she does not want to go to [place] and wants to go to [facility] in [place], but there is not a bed currently available. MD stated will just discharge in morning. Will continue to monitor.

## 2016-11-01 NOTE — Progress Notes (Signed)
Pt found to have BP of 94/58, MAP 66 at 0614.  Took manual BP at 0630, 92/52.  Notified Hal Hope, MD.  No new orders at this time.
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
## 2016-11-02 LAB — GLUCOSE, CAPILLARY
GLUCOSE-CAPILLARY: 118 mg/dL — AB (ref 65–99)
GLUCOSE-CAPILLARY: 147 mg/dL — AB (ref 65–99)
Glucose-Capillary: 212 mg/dL — ABNORMAL HIGH (ref 65–99)

## 2016-11-02 MED ORDER — LIDOCAINE 5 % EX PTCH: 1.0000 | MEDICATED_PATCH | CUTANEOUS | 0 refills | Status: DC

## 2016-11-02 MED ORDER — MELATONIN 3 MG PO TABS
1.5000 mg | ORAL_TABLET | Freq: Once | ORAL | Status: DC
  Filled 2016-11-02: qty 0.5

## 2016-11-02 MED ORDER — GUAIFENESIN ER 600 MG PO TB12: 1200.0000 mg | ORAL_TABLET | Freq: Two times a day (BID) | ORAL | 0 refills | Status: DC

## 2016-11-02 MED ORDER — QUETIAPINE FUMARATE ER 300 MG PO TB24: 300.0000 mg | ORAL_TABLET | Freq: Every day | ORAL | 0 refills | Status: AC

## 2016-11-02 MED ORDER — DICLOFENAC SODIUM 1 % TD GEL: 2.0000 g | Freq: Four times a day (QID) | TRANSDERMAL | 0 refills | Status: AC

## 2016-11-02 NOTE — Progress Notes (Signed)
Melanie Foster is still able to accept patient today. CSW is working on [employer] for patient before discharge.   Melanie Foster LCSWA [phone]

## 2016-11-02 NOTE — Progress Notes (Signed)
Gillis Santa to be D/C'd to [facility] in [place], [place] per MD order. Report called to [facility].  Allergies as of 11/02/2016   No Active Allergies     Medication List    STOP taking these medications   mirtazapine 15 MG tablet Commonly known as:  REMERON   omeprazole 20 MG capsule Commonly known as:  PRILOSEC   verapamil 120 MG CR tablet Commonly known as:  CALAN-SR   zolpidem 10 MG tablet Commonly known as:  AMBIEN     TAKE these medications   albuterol 108 (90 Base) MCG/ACT inhaler Commonly known as:  PROVENTIL HFA;VENTOLIN HFA Inhale 1-2 puffs into the lungs every 6 (six) hours as needed for wheezing or shortness of breath.   buPROPion 150 MG 24 hr tablet Commonly known as:  WELLBUTRIN XL Take 1 tablet (150 mg total) by mouth daily.   clonazePAM 1 MG tablet Commonly known as:  KLONOPIN Take 1 mg by mouth daily.   diclofenac sodium 1 % Gel Commonly known as:  VOLTAREN Apply 2 g topically 4 (four) times daily.   DULoxetine 60 MG capsule Commonly known as:  CYMBALTA Take 1 capsule (60 mg total) by mouth every evening. What changed:  how much to take   guaiFENesin 600 MG 12 hr tablet Commonly known as:  MUCINEX Take 2 tablets (1,200 mg total) by mouth 2 (two) times daily.   hydrOXYzine 25 MG tablet Commonly known as:  ATARAX/VISTARIL Take 1 tablet (25 mg total) by mouth every 6 (six) hours as needed for anxiety. Take 2 tablets (50mg  total) by mouth as needed for insomnia   levothyroxine 25 MCG tablet Commonly known as:  SYNTHROID, LEVOTHROID Take 1 tablet (25 mcg total) by mouth daily before breakfast. For thyroid hormone replacement   lidocaine 5 % Commonly known as:  LIDODERM Place 1 patch onto the skin daily. Remove & Discard patch within 12 hours or as directed by MD   lubiprostone 24 MCG capsule Commonly known as:  AMITIZA Take 1 capsule (24 mcg total) by mouth 2 (two) times daily with a meal.   metFORMIN 500 MG tablet Commonly known as:  GLUCOPHAGE Take  1 tablet (500 mg total) by mouth daily with breakfast. For diabetes management   nicotine 21 mg/24hr patch Commonly known as:  NICODERM CQ - dosed in mg/24 hours Place 1 patch (21 mg total) onto the skin daily.   oxyCODONE-acetaminophen 7.5-325 MG tablet Commonly known as:  PERCOCET Take 1 tablet by mouth every 6 (six) hours as needed for severe pain.   pravastatin 10 MG tablet Commonly known as:  PRAVACHOL Take 1 tablet (10 mg total) by mouth at bedtime. What changed:  how much to take   prazosin 1 MG capsule Commonly known as:  MINIPRESS Take 1 capsule (1 mg total) by mouth at bedtime.   pregabalin 150 MG capsule Commonly known as:  LYRICA Take 1 capsule (150 mg total) by mouth 3 (three) times daily. What changed:  when to take this   QUEtiapine 300 MG 24 hr tablet Commonly known as:  SEROQUEL XR Take 1 tablet (300 mg total) by mouth at bedtime. What changed:  medication strength  how much to take  Another medication with the same name was removed. Continue taking this medication, and follow the directions you see here.   rivaroxaban 20 MG Tabs tablet Commonly known as:  XARELTO Take 1 tablet (20 mg total) by mouth daily with supper.   triamcinolone ointment 0.1 % Commonly known  as:  KENALOG Apply 1 application topically 2 (two) times daily as needed (rash).       VVS, Skin clean, dry and intact without evidence of skin break down, no evidence of skin tears noted.  An After Visit Summary was printed and given to the sherriff.  Patient escorted via [place], and D/C to [facility] in [place] via [employer].   Melonie Florida  11/02/2016 6:39 PM

## 2016-11-02 NOTE — Progress Notes (Signed)
Patient has been served by [employer] [facility]. CSW faxed IVC to [facility] (f. [phone]). Patient is unhappy but reports understanding that [facility] is unable to accept her and that [facility] in [place] is the only accepting facility that we have.   Sheriff's office is trying to determine if they can arrange transportation for tonight. They will call RN if they are able to transport patient. If so, Patient will be discharging to:  [facility] in [place] [address] Accepting MD: Dr. Narda Amber  RN already given # for report [phone] ext [phone]). Please contact after hours CSW if assistance needed [phone].   Percell Locus Etheline Geppert LCSWA [phone]
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
## 2016-11-02 NOTE — Progress Notes (Signed)
Patient is IVC'd. CSW confirmed receipt of IVC with Magistrate. Patient to be served tonight in hopes of being able to call for [facility] transport by 7pm for discharge today.   Melanie Foster LCSWA [phone]

## 2016-11-02 NOTE — Progress Notes (Signed)
Attempted report to [facility] in [place] for patient transport.

## 2016-11-02 NOTE — Care Management Important Message (Signed)
Important Message  Patient Details  Name: Melanie Foster MRN: [id number] Date of Birth: [birth year]   Medicare Important Message Given:  Yes    Nathen May 11/02/2016, 10:39 AM

## 2016-11-03 NOTE — Progress Notes (Signed)
Patient will DC to: [facility] Anticipated DC date: 11/02/16 Family notified: NA Transport by: [employer]   CSW signing off.  Cedric Fishman, [place] Social Worker [phone]

## 2016-11-30 ENCOUNTER — Other Ambulatory Visit: Payer: Self-pay

## 2016-11-30 ENCOUNTER — Emergency Department (HOSPITAL_COMMUNITY)
Admission: EM | Admit: 2016-11-30 | Discharge: 2016-11-30 | Disposition: A | Discharge status: Home / Self Care | Payer: Medicare Other | Attending: Emergency Medicine | Admitting: Emergency Medicine

## 2016-11-30 ENCOUNTER — Encounter (HOSPITAL_COMMUNITY): Payer: Self-pay | Admitting: *Deleted

## 2016-11-30 ENCOUNTER — Emergency Department (HOSPITAL_COMMUNITY): Payer: Medicare Other

## 2016-11-30 DIAGNOSIS — K5720 Diverticulitis of large intestine with perforation and abscess without bleeding: Secondary | ICD-10-CM | POA: Insufficient documentation

## 2016-11-30 DIAGNOSIS — R1031 Right lower quadrant pain: Secondary | ICD-10-CM | POA: Diagnosis present

## 2016-11-30 DIAGNOSIS — E119 Type 2 diabetes mellitus without complications: Secondary | ICD-10-CM | POA: Insufficient documentation

## 2016-11-30 DIAGNOSIS — Z7984 Long term (current) use of oral hypoglycemic drugs: Secondary | ICD-10-CM | POA: Diagnosis not present

## 2016-11-30 DIAGNOSIS — I10 Essential (primary) hypertension: Secondary | ICD-10-CM | POA: Diagnosis not present

## 2016-11-30 DIAGNOSIS — F17210 Nicotine dependence, cigarettes, uncomplicated: Secondary | ICD-10-CM | POA: Diagnosis not present

## 2016-11-30 DIAGNOSIS — Z79899 Other long term (current) drug therapy: Secondary | ICD-10-CM | POA: Diagnosis not present

## 2016-11-30 LAB — URINALYSIS, ROUTINE W REFLEX MICROSCOPIC
Bilirubin Urine: NEGATIVE
HGB URINE DIPSTICK: NEGATIVE
Ketones, ur: NEGATIVE mg/dL
Leukocytes, UA: NEGATIVE
NITRITE: NEGATIVE
Protein, ur: NEGATIVE mg/dL
SPECIFIC GRAVITY, URINE: 1.016 (ref 1.005–1.030)
pH: 5 (ref 5.0–8.0)

## 2016-11-30 LAB — CBC WITH DIFFERENTIAL/PLATELET
BASOS PCT: 1 %
Basophils Absolute: 0 10*3/uL (ref 0.0–0.1)
Eosinophils Absolute: 0.2 10*3/uL (ref 0.0–0.7)
Eosinophils Relative: 2 %
HEMATOCRIT: 35.9 % — AB (ref 36.0–46.0)
HEMOGLOBIN: 10.8 g/dL — AB (ref 12.0–15.0)
LYMPHS PCT: 41 %
Lymphs Abs: 3.7 10*3/uL (ref 0.7–4.0)
MCH: 27.1 pg (ref 26.0–34.0)
MCHC: 30.1 g/dL (ref 30.0–36.0)
MCV: 90.2 fL (ref 78.0–100.0)
MONO ABS: 1.3 10*3/uL — AB (ref 0.1–1.0)
MONOS PCT: 15 %
NEUTROS ABS: 3.6 10*3/uL (ref 1.7–7.7)
NEUTROS PCT: 41 %
Platelets: 530 10*3/uL — ABNORMAL HIGH (ref 150–400)
RBC: 3.98 MIL/uL (ref 3.87–5.11)
RDW: 15 % (ref 11.5–15.5)
WBC: 8.9 10*3/uL (ref 4.0–10.5)

## 2016-11-30 LAB — COMPREHENSIVE METABOLIC PANEL
ALK PHOS: 76 U/L (ref 38–126)
ALT: 17 U/L (ref 14–54)
ANION GAP: 8 (ref 5–15)
AST: 19 U/L (ref 15–41)
Albumin: 3.9 g/dL (ref 3.5–5.0)
BUN: 6 mg/dL (ref 6–20)
CHLORIDE: 106 mmol/L (ref 101–111)
CO2: 25 mmol/L (ref 22–32)
Calcium: 9.4 mg/dL (ref 8.9–10.3)
Creatinine, Ser: 0.48 mg/dL (ref 0.44–1.00)
GFR calc Af Amer: >60 mL/min (ref >60)
GFR calc non Af Amer: >60 mL/min (ref >60)
GLUCOSE: 110 mg/dL — AB (ref 65–99)
POTASSIUM: 3.5 mmol/L (ref 3.5–5.1)
Sodium: 139 mmol/L (ref 135–145)
TOTAL PROTEIN: 7.6 g/dL (ref 6.5–8.1)
Total Bilirubin: 0.3 mg/dL (ref 0.3–1.2)

## 2016-11-30 LAB — LIPASE, BLOOD: Lipase: 31 U/L (ref 11–51)

## 2016-11-30 MED ORDER — SODIUM CHLORIDE 0.9 % IV SOLN: INTRAVENOUS | Status: DC

## 2016-11-30 MED ORDER — HYDROMORPHONE HCL 1 MG/ML IJ SOLN
0.5000 mg | INTRAMUSCULAR | Status: DC | PRN
  Administered 2016-11-30 (×2): 0.5 mg via INTRAVENOUS
  Filled 2016-11-30 (×2): qty 1

## 2016-11-30 MED ORDER — IOPAMIDOL (ISOVUE-300) INJECTION 61%
100.0000 mL | Freq: Once | INTRAVENOUS | Status: AC | PRN
  Administered 2016-11-30: 100 mL via INTRAVENOUS

## 2016-11-30 MED ORDER — ONDANSETRON HCL 4 MG/2ML IJ SOLN
4.0000 mg | Freq: Once | INTRAMUSCULAR | Status: AC
  Administered 2016-11-30: 4 mg via INTRAVENOUS
  Filled 2016-11-30: qty 2

## 2016-11-30 MED ORDER — SODIUM CHLORIDE 0.9 % IV BOLUS (SEPSIS)
1000.0000 mL | Freq: Once | INTRAVENOUS | Status: AC
  Administered 2016-11-30: 1000 mL via INTRAVENOUS

## 2016-11-30 MED ORDER — METRONIDAZOLE 500 MG PO TABS
500.0000 mg | ORAL_TABLET | Freq: Once | ORAL | Status: AC
  Administered 2016-11-30: 500 mg via ORAL
  Filled 2016-11-30: qty 1

## 2016-11-30 MED ORDER — METRONIDAZOLE 500 MG PO TABS: 500.0000 mg | ORAL_TABLET | Freq: Three times a day (TID) | ORAL | 0 refills | Status: DC

## 2016-11-30 MED ORDER — CIPROFLOXACIN HCL 500 MG PO TABS: 500.0000 mg | ORAL_TABLET | Freq: Two times a day (BID) | ORAL | 0 refills | Status: DC

## 2016-11-30 MED ORDER — DEXTROSE 5 % IV SOLN
2.0000 g | Freq: Once | INTRAVENOUS | Status: AC
  Administered 2016-11-30: 2 g via INTRAVENOUS
  Filled 2016-11-30: qty 2

## 2016-11-30 NOTE — ED Provider Notes (Signed)
[facility] EMERGENCY DEPARTMENT Provider Note   CSN: [id number] Arrival date & time: 11/30/16  1901     History   Chief Complaint Chief Complaint  Patient presents with  . Abdominal Pain    HPI Melanie Foster is a 61 y.o. female.  HPI Patient presents to the emergency room for evaluation of lower abdominal pain.  Patient states he started having pain in her lobar abdomen on Sunday.  Pain persists and radiates towards her back.  She is felt nauseated but has not had any vomiting.  Nothing seems to make it better or worse.  Denies any dysuria.  No constipation or diarrhea.  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  She is also had a cholecystectomy.  She also had a laparotomy for lysis of adhesions following her tenectomy.  Patient denies having similar pain symptoms like this in the past Past Medical History:  Diagnosis Date  . Anxiety   . Arthritis   . Bipolar 1 disorder ([place])   . Cataract   . Cocaine abuse ([place])   . Colon polyps   . Depression   . Diabetes mellitus without complication ([place])   . Drug abuse ([place])   . GERD (gastroesophageal reflux disease)   . H. pylori infection   . Helicobacter pylori gastritis 02/08/2016  . Hypercholesteremia   . Hypertension   . Hypothyroid   . Marijuana abuse   . Nerve damage    both legs  . Osteopenia   . Pancreatitis   . Polysubstance abuse ([place])   . Suicidal ideation     Patient Active Problem List   Diagnosis Date Noted  . Acute encephalopathy 10/21/2016  . Tachycardia 10/21/2016  . Chronic pain syndrome 06/10/2016  . 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 Left pulmonary embolus ([place]) 04/26/2016  . Abnormal urinalysis 04/26/2016  . Diabetes mellitus with complication ([place])   . Helicobacter pylori gastritis 02/08/2016  . Bipolar disorder, mixed, severe, w/o psychotic features ([place]) 09/16/2015  . Cocaine use  disorder, severe, dependence ([place]) 09/16/2015  . Cannabis use disorder, mild, abuse 09/16/2015  . Polysubstance dependence ([place]) 09/15/2015  . Suicidal ideation 07/14/2015  . Hypertension 07/14/2015  . Drug overdose 07/14/2015  . Hypokalemia 07/14/2015  . Marijuana abuse 07/14/2015  . Chronic back pain 07/14/2015  . Suicide attempt ([place])   . Herpes simplex 04/28/2015  . Tobacco abuse 04/28/2015  . Chronic bronchitis ([place]) 04/28/2015  . 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 Hyperlipidemia 04/22/2015  . Type 2 diabetes mellitus with hyperglycemia ([place]) 04/22/2015  . Hypothyroidism 04/22/2015    Past Surgical History:  Procedure Laterality Date  . CHOLECYSTECTOMY    . COLONOSCOPY    . KNEE SURGERY Left unknown   surgical scar noted  . PARTIAL HYSTERECTOMY    . ROTATOR CUFF REPAIR Right unknown   per client, surgery scar noted  . SPLENECTOMY Right unknown   per client  . UPPER GASTROINTESTINAL ENDOSCOPY      OB History    No data available       Home Medications    Prior to Admission medications   Medication Sig Start Date End Date Taking? Authorizing Provider  albuterol (PROVENTIL HFA;VENTOLIN HFA) 108 (90 Base) MCG/ACT inhaler Inhale 1-2 puffs into the lungs every 6 (six) hours as needed for wheezing or shortness of breath.    [provider]  buPROPion (WELLBUTRIN XL) 150 MG 24  hr tablet Take 1 tablet (150 mg total) by mouth daily. Patient not taking: Reported on 10/23/2016 06/20/16   Derrill Center, NP  ciprofloxacin (CIPRO) 500 MG tablet Take 1 tablet (500 mg total) by mouth 2 (two) times daily. 11/30/16   Dorie Rank, MD  clonazePAM (KLONOPIN) 1 MG tablet Take 1 mg by mouth daily.  06/02/16   [provider]  diclofenac sodium (VOLTAREN) 1 % GEL Apply 2 g topically 4 (four) times daily. 11/02/16   Lavina Hamman, MD  DULoxetine (CYMBALTA) 60 MG capsule Take 1 capsule (60 mg total) by mouth every evening. Patient taking  differently: Take 30 mg by mouth every evening.  06/19/16   Derrill Center, NP  guaiFENesin (MUCINEX) 600 MG 12 hr tablet Take 2 tablets (1,200 mg total) by mouth 2 (two) times daily. 11/02/16   Lavina Hamman, MD  hydrOXYzine (ATARAX/VISTARIL) 25 MG tablet Take 1 tablet (25 mg total) by mouth every 6 (six) hours as needed for anxiety. Take 2 tablets (50mg  total) by mouth as needed for insomnia 06/19/16   Derrill Center, NP  levothyroxine (SYNTHROID, LEVOTHROID) 25 MCG tablet Take 1 tablet (25 mcg total) by mouth daily before breakfast. For thyroid hormone replacement 09/23/15   Lindell Spar I, NP  lidocaine (LIDODERM) 5 % Place 1 patch onto the skin daily. Remove & Discard patch within 12 hours or as directed by MD 11/02/16   Lavina Hamman, MD  lubiprostone (AMITIZA) 24 MCG capsule Take 1 capsule (24 mcg total) by mouth 2 (two) times daily with a meal. 06/19/16   Derrill Center, NP  metFORMIN (GLUCOPHAGE) 500 MG tablet Take 1 tablet (500 mg total) by mouth daily with breakfast. For diabetes management 09/23/15   Lindell Spar I, NP  metroNIDAZOLE (FLAGYL) 500 MG tablet Take 1 tablet (500 mg total) by mouth 3 (three) times daily. 11/30/16   Dorie Rank, MD  nicotine (NICODERM CQ - DOSED IN MG/24 HOURS) 21 mg/24hr patch Place 1 patch (21 mg total) onto the skin daily. Patient not taking: Reported on 10/23/2016 06/20/16   Derrill Center, NP  oxyCODONE-acetaminophen (PERCOCET) 7.5-325 MG tablet Take 1 tablet by mouth every 6 (six) hours as needed for severe pain.    [provider]  pravastatin (PRAVACHOL) 10 MG tablet Take 1 tablet (10 mg total) by mouth at bedtime. Patient taking differently: Take 20 mg by mouth at bedtime.  06/19/16   Derrill Center, NP  prazosin (MINIPRESS) 1 MG capsule Take 1 capsule (1 mg total) by mouth at bedtime. 06/19/16   Derrill Center, NP  pregabalin (LYRICA) 150 MG capsule Take 1 capsule (150 mg total) by mouth 3 (three) times daily. Patient taking differently: Take  150 mg by mouth 2 (two) times daily.  06/19/16   Derrill Center, NP  QUEtiapine (SEROQUEL XR) 300 MG 24 hr tablet Take 1 tablet (300 mg total) by mouth at bedtime. 11/02/16   Lavina Hamman, MD  rivaroxaban (XARELTO) 20 MG TABS tablet Take 1 tablet (20 mg total) by mouth daily with supper. 06/19/16   Derrill Center, NP  triamcinolone ointment (KENALOG) 0.1 % Apply 1 application topically 2 (two) times daily as needed (rash).  06/02/16   [provider]    Family History Family History  Problem Relation Age of Onset  . Gallbladder disease Mother        mets  . Diabetes Mother   . Colon polyps Mother   .  Hypertension Mother   . Alcoholism Cousin   . Diabetes Father   . Diabetic kidney disease Maternal Aunt   . Diabetes Maternal Aunt     Social History Social History   Tobacco Use  . Smoking status: Current Every Day Smoker    Packs/day: 0.50    Types: Cigarettes  . Smokeless tobacco: Never Used  Substance Use Topics  . Alcohol use: No    Comment: quit  . Drug use: Yes    Types: Codeine, Benzodiazepines, Marijuana, Cocaine    Comment: cocaine used 03/23/16     Allergies   Patient has no known allergies.   Review of Systems Review of Systems  All other systems reviewed and are negative.    Physical Exam Updated Vital Signs BP (!) 165/116   Pulse 89   Temp 98.2 F (36.8 C) (Oral)   Resp 18   Ht 1.651 m (5\' 5" )   Wt 83.9 kg (185 lb)   SpO2 100%   BMI 30.79 kg/m   Physical Exam  Constitutional: She appears well-developed and well-nourished. No distress.  HENT:  Head: Normocephalic and atraumatic.  Right Ear: External ear normal.  Left Ear: External ear normal.  Eyes: Conjunctivae are normal. Right eye exhibits no discharge. Left eye exhibits no discharge. No scleral icterus.  Neck: Neck supple. No tracheal deviation present.  Cardiovascular: Normal rate, regular rhythm and intact distal pulses.  Pulmonary/Chest: Effort normal and breath sounds  normal. No stridor. No respiratory distress. She has no wheezes. She has no rales.  Abdominal: Soft. Bowel sounds are normal. She exhibits no distension. There is tenderness in the right lower quadrant, suprapubic area and left lower quadrant. There is no rebound and no guarding. No hernia.  Musculoskeletal: She exhibits no edema or tenderness.  Neurological: She is alert. She has normal strength. No cranial nerve deficit (no facial droop, extraocular movements intact, no slurred speech) or sensory deficit. She exhibits normal muscle tone. She displays no seizure activity. Coordination normal.  Skin: Skin is warm and dry. No rash noted.  Psychiatric: She has a normal mood and affect.  Nursing note and vitals reviewed.    ED Treatments / Results  Labs (all labs ordered are listed, but only abnormal results are displayed) Labs Reviewed  URINALYSIS, ROUTINE W REFLEX MICROSCOPIC - Abnormal; Notable for the following components:      Result Value   APPearance HAZY (*)    Glucose, UA >=500 (*)    Bacteria, UA FEW (*)    Squamous Epithelial / LPF 6-30 (*)    All other components within normal limits  COMPREHENSIVE METABOLIC PANEL - Abnormal; Notable for the following components:   Glucose, Bld 110 (*)    All other components within normal limits  CBC WITH DIFFERENTIAL/PLATELET - Abnormal; Notable for the following components:   Hemoglobin 10.8 (*)    HCT 35.9 (*)    Platelets 530 (*)    Monocytes Absolute 1.3 (*)    All other components within normal limits  LIPASE, BLOOD    EKG  EKG Interpretation None       Radiology Ct Abdomen Pelvis W Contrast  Result Date: 11/30/2016 CLINICAL DATA:  Hervey Ard, intern and lower abdominal pain with pressure that radiates to the back. Symptoms began a few days ago. There is associated nausea. EXAM: CT ABDOMEN AND PELVIS WITH CONTRAST TECHNIQUE: Multidetector CT imaging of the abdomen and pelvis was performed using the standard protocol following  bolus administration of intravenous contrast. CONTRAST:  111mL ISOVUE-300 IOPAMIDOL (ISOVUE-300) INJECTION 61% COMPARISON:  None. FINDINGS: Lower chest: Clear lung bases.  Heart normal size. Hepatobiliary: Liver is unremarkable. Gallbladder surgically absent. There is intern extrahepatic bile duct dilation. Common bile duct measures a maximum of 12 mm in diameter, but demonstrates distal tapering. This is presumably chronic. Pancreas: Unremarkable. No pancreatic ductal dilatation or surrounding inflammatory changes. Spleen: Spleen surgically absent. There is a small amount of residual/regenerated splenic tissue in the left upper quadrant. Adrenals/Urinary Tract: No adrenal masses. 4 mm low-density lesion in the lower pole left kidney, too small to fully characterize, but consistent with a cyst. No other renal masses, no stones and no hydronephrosis. Ureters are normal course and in caliber. Bladder is unremarkable. Stomach/Bowel: There are is inflammatory stranding adjacent to the lower sigmoid colon centered on the a diverticulum. There is no extraluminal air and no fluid collection is seen to suggest an abscess. There are other colonic diverticular without associated inflammation. Stomach and small bowel are unremarkable. Normal appendix visualized. Vascular/Lymphatic: No significant vascular findings are present. No enlarged abdominal or pelvic lymph nodes. Reproductive: Status post hysterectomy. No adnexal masses. Other: No abdominal wall hernia or abnormality. No abdominopelvic ascites. Musculoskeletal: No acute or significant osseous findings. IMPRESSION: 1. Mild uncomplicated sigmoid diverticulitis. No abscess or free air. 2. No other acute abnormalities within the abdomen or pelvis. 3. Intra and extrahepatic bile duct dilation in this patient who has had a prior cholecystectomy. This is presumed chronic. Electronically Signed   By: Lajean Manes M.D.   On: 11/30/2016 21:50    Procedures Procedures  (includng critical care time)  iMedications Ordered in ED Medications  sodium chloride 0.9 % bolus 1,000 mL (0 mLs Intravenous Stopped 11/30/16 2056)    And  0.9 %  sodium chloride infusion (not administered)  HYDROmorphone (DILAUDID) injection 0.5 mg (0.5 mg Intravenous Given 11/30/16 2230)  cefTRIAXone (ROCEPHIN) 2 g in dextrose 5 % 50 mL IVPB (2 g Intravenous New Bag/Given 11/30/16 2230)  ondansetron (ZOFRAN) injection 4 mg (4 mg Intravenous Given 11/30/16 1958)  iopamidol (ISOVUE-300) 61 % injection 100 mL (100 mLs Intravenous Contrast Given 11/30/16 2131)  metroNIDAZOLE (FLAGYL) tablet 500 mg (500 mg Oral Given 11/30/16 2229)     Initial Impression / Assessment and Plan / ED Course  I have reviewed the triage vital signs and the nursing notes.  Pertinent labs & imaging results that were available during my care of the patient were reviewed by me and considered in my medical decision making (see chart for details).  Clinical Course as of Nov 30 2253  Thu Nov 30, 2016  2103 Pain meds have helped however pt still having pain in lower abdomen.  Will ct to evaluate further  [JK]  [place] review.  Patient was given 30-day supply of oxycodone on November 20  [JK]    Clinical Course User Index [JK] Dorie Rank, MD    Patient presented to the emergency room for evaluation of lower abdominal pain.  CT scan shows uncomplicated diverticulitis.  Patient is afebrile.  She is not having any trouble with nausea or vomiting.  Appears stable for outpatient treatment.  Final Clinical Impressions(s) / ED Diagnoses   Final diagnoses:  Diverticulitis of large intestine with perforation without abscess or bleeding    ED Discharge Orders        Ordered    ciprofloxacin (CIPRO) 500 MG tablet  2 times daily     11/30/16 2254    metroNIDAZOLE (FLAGYL)  500 MG tablet  3 times daily     11/30/16 2254       Dorie Rank, MD 11/30/16 2256

## 2016-11-30 NOTE — ED Notes (Signed)
Pt back from CT

## 2016-11-30 NOTE — ED Triage Notes (Signed)
Pt c/o lower abd pain with pressure that radiates around to back area that started a few days ago with nausea, denies any vomiting, diarrhea.

## 2016-11-30 NOTE — ED Notes (Signed)
Pt going to CT

## 2016-11-30 NOTE — Discharge Instructions (Signed)
Take the antibiotics as prescribed, follow-up with your primary care doctor next week to make sure the symptoms are improving.  Return for fever vomiting worsening symptoms

## 2016-12-02 ENCOUNTER — Emergency Department (HOSPITAL_COMMUNITY)
Admission: EM | Admit: 2016-12-02 | Discharge: 2016-12-02 | Disposition: A | Discharge status: Home / Self Care | Payer: Medicare Other | Attending: Emergency Medicine | Admitting: Emergency Medicine

## 2016-12-02 ENCOUNTER — Encounter (HOSPITAL_COMMUNITY): Payer: Self-pay | Admitting: Emergency Medicine

## 2016-12-02 ENCOUNTER — Emergency Department (HOSPITAL_COMMUNITY): Payer: Medicare Other

## 2016-12-02 ENCOUNTER — Other Ambulatory Visit: Payer: Self-pay

## 2016-12-02 DIAGNOSIS — I10 Essential (primary) hypertension: Secondary | ICD-10-CM | POA: Diagnosis not present

## 2016-12-02 DIAGNOSIS — R1030 Lower abdominal pain, unspecified: Secondary | ICD-10-CM | POA: Diagnosis not present

## 2016-12-02 DIAGNOSIS — E876 Hypokalemia: Secondary | ICD-10-CM | POA: Insufficient documentation

## 2016-12-02 DIAGNOSIS — E1165 Type 2 diabetes mellitus with hyperglycemia: Secondary | ICD-10-CM | POA: Diagnosis not present

## 2016-12-02 DIAGNOSIS — E039 Hypothyroidism, unspecified: Secondary | ICD-10-CM | POA: Insufficient documentation

## 2016-12-02 DIAGNOSIS — Z79899 Other long term (current) drug therapy: Secondary | ICD-10-CM | POA: Insufficient documentation

## 2016-12-02 DIAGNOSIS — Z7983 Long term (current) use of bisphosphonates: Secondary | ICD-10-CM | POA: Diagnosis not present

## 2016-12-02 DIAGNOSIS — F17210 Nicotine dependence, cigarettes, uncomplicated: Secondary | ICD-10-CM | POA: Insufficient documentation

## 2016-12-02 LAB — CBC WITH DIFFERENTIAL/PLATELET
Basophils Absolute: 0 10*3/uL (ref 0.0–0.1)
Basophils Relative: 0 %
EOS PCT: 3 %
Eosinophils Absolute: 0.2 10*3/uL (ref 0.0–0.7)
HEMATOCRIT: 35.4 % — AB (ref 36.0–46.0)
Hemoglobin: 10.9 g/dL — ABNORMAL LOW (ref 12.0–15.0)
LYMPHS ABS: 3.3 10*3/uL (ref 0.7–4.0)
LYMPHS PCT: 37 %
MCH: 27.3 pg (ref 26.0–34.0)
MCHC: 30.8 g/dL (ref 30.0–36.0)
MCV: 88.7 fL (ref 78.0–100.0)
MONO ABS: 1.3 10*3/uL — AB (ref 0.1–1.0)
MONOS PCT: 14 %
Neutro Abs: 4.1 10*3/uL (ref 1.7–7.7)
Neutrophils Relative %: 46 %
PLATELETS: 489 10*3/uL — AB (ref 150–400)
RBC: 3.99 MIL/uL (ref 3.87–5.11)
RDW: 14.8 % (ref 11.5–15.5)
WBC: 8.9 10*3/uL (ref 4.0–10.5)

## 2016-12-02 LAB — COMPREHENSIVE METABOLIC PANEL
ALBUMIN: 3.6 g/dL (ref 3.5–5.0)
ALT: 14 U/L (ref 14–54)
AST: 26 U/L (ref 15–41)
Alkaline Phosphatase: 75 U/L (ref 38–126)
Anion gap: 10 (ref 5–15)
BILIRUBIN TOTAL: 0.4 mg/dL (ref 0.3–1.2)
BUN: 8 mg/dL (ref 6–20)
CHLORIDE: 107 mmol/L (ref 101–111)
CO2: 24 mmol/L (ref 22–32)
Calcium: 9.7 mg/dL (ref 8.9–10.3)
Creatinine, Ser: 0.71 mg/dL (ref 0.44–1.00)
GFR calc Af Amer: >60 mL/min (ref >60)
GFR calc non Af Amer: >60 mL/min (ref >60)
GLUCOSE: 241 mg/dL — AB (ref 65–99)
POTASSIUM: 3.3 mmol/L — AB (ref 3.5–5.1)
SODIUM: 141 mmol/L (ref 135–145)
Total Protein: 7.3 g/dL (ref 6.5–8.1)

## 2016-12-02 MED ORDER — MORPHINE SULFATE (PF) 4 MG/ML IV SOLN
4.0000 mg | Freq: Once | INTRAVENOUS | Status: AC
Start: 2016-12-02 — End: 2016-12-02
  Administered 2016-12-02: 4 mg via INTRAVENOUS
  Filled 2016-12-02: qty 1

## 2016-12-02 MED ORDER — POTASSIUM CHLORIDE CRYS ER 20 MEQ PO TBCR
40.0000 meq | EXTENDED_RELEASE_TABLET | Freq: Once | ORAL | Status: AC
  Administered 2016-12-02: 40 meq via ORAL
  Filled 2016-12-02: qty 2

## 2016-12-02 NOTE — Discharge Instructions (Signed)
Continue the antibiotics prescribed to you 2 days ago as directed.  Keep your scheduled appointment with your primary care physician for Friday, December 08, 2016.  Return to the emergency department if you develop fever higher than 100.4, vomiting or if concern for any reason.  You can take your Percocet as prescribed as needed for pain.  Ask Dr.Pavelock to recheck your blood pressure at your visit on November 30.  Today's was mildly elevated at 152/87.

## 2016-12-02 NOTE — ED Notes (Signed)
Pt up ambulatory with NAD to restroom. Pt back to room. Vitals WNL. Pt wanting a different room due to television "too small and too dark." informed pt we do have another place right now. Pt ok.

## 2016-12-02 NOTE — ED Notes (Signed)
Pt wanting something to drink. md informed.

## 2016-12-02 NOTE — ED Provider Notes (Signed)
[facility] EMERGENCY DEPARTMENT Provider Note   CSN: [id number] Arrival date & time: 12/02/16  1514     History   Chief Complaint Chief Complaint  Patient presents with  . Abdominal Pain    HPI she developed lower abdominal pain 2 days ago.  Was seen here had CT scan of the abdomen determined to have uncomplicated sigmoid diverticulitis.  She was prescribed Cipro and Flagyl.  She was not prescribed pain medicine as she has a prescription for oxycodone-acetaminophen 90 tablets written 11/28/2016 which she takes for chronic back pain and knee pain.  She reports that her pain became worse 3 hours ago.  She has not taken any pain medicine since leaving here 2 days ago because "I wanted to see how my abdomen would do" she is presently hungry.  She states the pain is improved with pressing on the area not made worse with anything.  She admits to nausea though says she is presently hungry no urinary symptoms last bowel movement 4 days ago no fever no other associated symptoms Melanie Foster is a 61 y.o. female.  HPI  Past Medical History:  Diagnosis Date  . Anxiety   . Arthritis   . Bipolar 1 disorder ([place])   . Cataract   . Cocaine abuse ([place])   . Colon polyps   . Depression   . Diabetes mellitus without complication ([place])   . Drug abuse ([place])   . GERD (gastroesophageal reflux disease)   . H. pylori infection   . Helicobacter pylori gastritis 02/08/2016  . Hypercholesteremia   . Hypertension   . Hypothyroid   . Marijuana abuse   . Nerve damage    both legs  . Osteopenia   . Pancreatitis   . Polysubstance abuse ([place])   . Suicidal ideation     Patient Active Problem List   Diagnosis Date Noted  . Acute encephalopathy 10/21/2016  . Tachycardia 10/21/2016  . Chronic pain syndrome 06/10/2016  . 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 Left pulmonary embolus ([place]) 04/26/2016  . Abnormal urinalysis 04/26/2016  . Diabetes mellitus  with complication ([place])   . Helicobacter pylori gastritis 02/08/2016  . Bipolar disorder, mixed, severe, w/o psychotic features ([place]) 09/16/2015  . Cocaine use disorder, severe, dependence ([place]) 09/16/2015  . Cannabis use disorder, mild, abuse 09/16/2015  . Polysubstance dependence ([place]) 09/15/2015  . Suicidal ideation 07/14/2015  . Hypertension 07/14/2015  . Drug overdose 07/14/2015  . Hypokalemia 07/14/2015  . Marijuana abuse 07/14/2015  . Chronic back pain 07/14/2015  . Suicide attempt ([place])   . Herpes simplex 04/28/2015  . Tobacco abuse 04/28/2015  . Chronic bronchitis ([place]) 04/28/2015  . DM neuropathy, type II diabetes mellitus ([place]) 04/28/2015  . Hyperlipidemia 04/22/2015  . Type 2 diabetes mellitus with hyperglycemia ([place]) 04/22/2015  . Hypothyroidism 04/22/2015    Past Surgical History:  Procedure Laterality Date  . CHOLECYSTECTOMY    . COLONOSCOPY    . KNEE SURGERY Left unknown   surgical scar noted  . PARTIAL HYSTERECTOMY    . ROTATOR CUFF REPAIR Right unknown   per client, surgery scar noted  . SPLENECTOMY Right unknown   per client  . UPPER GASTROINTESTINAL ENDOSCOPY      OB History    No data available       Home Medications    Prior to Admission medications   Medication Sig Start Date End Date Taking? Authorizing Provider  albuterol (PROVENTIL HFA;VENTOLIN HFA) 108 (90 Base)  MCG/ACT inhaler Inhale 1-2 puffs into the lungs every 6 (six) hours as needed for wheezing or shortness of breath.   Yes [provider]  metroNIDAZOLE (FLAGYL) 500 MG tablet Take 1 tablet (500 mg total) by mouth 3 (three) times daily. 11/30/16  Yes Dorie Rank, MD  oxyCODONE-acetaminophen (PERCOCET) 7.5-325 MG tablet Take 1 tablet by mouth every 6 (six) hours as needed for severe pain.   Yes [provider]  pregabalin (LYRICA) 150 MG capsule Take 1 capsule (150 mg total) by mouth 3 (three) times daily. Patient taking differently: Take 75 mg by mouth 2 (two) times  daily.  06/19/16  Yes Derrill Center, NP  buPROPion (WELLBUTRIN XL) 150 MG 24 hr tablet Take 1 tablet (150 mg total) by mouth daily. Patient not taking: Reported on 10/23/2016 06/20/16   Derrill Center, NP  ciprofloxacin (CIPRO) 500 MG tablet Take 1 tablet (500 mg total) by mouth 2 (two) times daily. 11/30/16   Dorie Rank, MD  clonazePAM (KLONOPIN) 1 MG tablet Take 1 mg by mouth daily.  06/02/16   [provider]  diclofenac sodium (VOLTAREN) 1 % GEL Apply 2 g topically 4 (four) times daily. 11/02/16   Lavina Hamman, MD  DULoxetine (CYMBALTA) 60 MG capsule Take 1 capsule (60 mg total) by mouth every evening. Patient taking differently: Take 30 mg by mouth every evening.  06/19/16   Derrill Center, NP  guaiFENesin (MUCINEX) 600 MG 12 hr tablet Take 2 tablets (1,200 mg total) by mouth 2 (two) times daily. 11/02/16   Lavina Hamman, MD  hydrOXYzine (ATARAX/VISTARIL) 25 MG tablet Take 1 tablet (25 mg total) by mouth every 6 (six) hours as needed for anxiety. Take 2 tablets (50mg  total) by mouth as needed for insomnia 06/19/16   Derrill Center, NP  levothyroxine (SYNTHROID, LEVOTHROID) 25 MCG tablet Take 1 tablet (25 mcg total) by mouth daily before breakfast. For thyroid hormone replacement 09/23/15   Lindell Spar I, NP  lidocaine (LIDODERM) 5 % Place 1 patch onto the skin daily. Remove & Discard patch within 12 hours or as directed by MD 11/02/16   Lavina Hamman, MD  lubiprostone (AMITIZA) 24 MCG capsule Take 1 capsule (24 mcg total) by mouth 2 (two) times daily with a meal. 06/19/16   Derrill Center, NP  metFORMIN (GLUCOPHAGE) 500 MG tablet Take 1 tablet (500 mg total) by mouth daily with breakfast. For diabetes management 09/23/15   Lindell Spar I, NP  nicotine (NICODERM CQ - DOSED IN MG/24 HOURS) 21 mg/24hr patch Place 1 patch (21 mg total) onto the skin daily. Patient not taking: Reported on 10/23/2016 06/20/16   Derrill Center, NP  pravastatin (PRAVACHOL) 10 MG tablet Take 1 tablet (10  mg total) by mouth at bedtime. Patient taking differently: Take 20 mg by mouth at bedtime.  06/19/16   Derrill Center, NP  prazosin (MINIPRESS) 1 MG capsule Take 1 capsule (1 mg total) by mouth at bedtime. 06/19/16   Derrill Center, NP  QUEtiapine (SEROQUEL XR) 300 MG 24 hr tablet Take 1 tablet (300 mg total) by mouth at bedtime. 11/02/16   Lavina Hamman, MD  rivaroxaban (XARELTO) 20 MG TABS tablet Take 1 tablet (20 mg total) by mouth daily with supper. 06/19/16   Derrill Center, NP  triamcinolone ointment (KENALOG) 0.1 % Apply 1 application topically 2 (two) times daily as needed (rash).  06/02/16   [provider]    Family History  Family History  Problem Relation Age of Onset  . Gallbladder disease Mother        mets  . Diabetes Mother   . Colon polyps Mother   . Hypertension Mother   . Alcoholism Cousin   . Diabetes Father   . Diabetic kidney disease Maternal Aunt   . Diabetes Maternal Aunt     Social History Social History   Tobacco Use  . Smoking status: Current Every Day Smoker    Packs/day: 0.50    Types: Cigarettes  . Smokeless tobacco: Never Used  Substance Use Topics  . Alcohol use: No    Comment: quit  . Drug use: Yes    Types: Codeine, Benzodiazepines, Marijuana, Cocaine    Comment: cocaine used 03/23/16     Allergies   Patient has no known allergies.   Review of Systems Review of Systems  Gastrointestinal: Positive for abdominal pain and nausea.  Allergic/Immunologic: Positive for immunocompromised state.  All other systems reviewed and are negative.    Physical Exam Updated Vital Signs BP (!) 152/104 (BP Location: Right Arm)   Pulse (!) 101   Temp 98.6 F (37 C) (Oral)   Resp 19   Ht 5\' 5"  (1.651 m)   Wt 83.9 kg (185 lb)   SpO2 99%   BMI 30.79 kg/m   Physical Exam  Constitutional: She appears well-developed and well-nourished.  HENT:  Head: Normocephalic and atraumatic.  Eyes: Conjunctivae are normal. Pupils are equal, round,  and reactive to light.  Neck: Neck supple. No tracheal deviation present. No thyromegaly present.  Cardiovascular: Normal rate and regular rhythm.  No murmur heard. Pulse 88 bpm counted by me  Pulmonary/Chest: Effort normal and breath sounds normal.  Abdominal: Soft. Bowel sounds are normal. She exhibits no distension. There is no tenderness.  Musculoskeletal: Normal range of motion. She exhibits no edema or tenderness.  Neurological: She is alert. Coordination normal.  Skin: Skin is warm and dry. No rash noted.  Psychiatric: She has a normal mood and affect.  Nursing note and vitals reviewed.    ED Treatments / Results  Labs (all labs ordered are listed, but only abnormal results are displayed) Labs Reviewed  COMPREHENSIVE METABOLIC PANEL  CBC WITH DIFFERENTIAL/PLATELET    EKG  EKG Interpretation None       Radiology Ct Abdomen Pelvis W Contrast  Result Date: 11/30/2016 CLINICAL DATA:  Hervey Ard, intern and lower abdominal pain with pressure that radiates to the back. Symptoms began a few days ago. There is associated nausea. EXAM: CT ABDOMEN AND PELVIS WITH CONTRAST TECHNIQUE: Multidetector CT imaging of the abdomen and pelvis was performed using the standard protocol following bolus administration of intravenous contrast. CONTRAST:  182mL ISOVUE-300 IOPAMIDOL (ISOVUE-300) INJECTION 61% COMPARISON:  None. FINDINGS: Lower chest: Clear lung bases.  Heart normal size. Hepatobiliary: Liver is unremarkable. Gallbladder surgically absent. There is intern extrahepatic bile duct dilation. Common bile duct measures a maximum of 12 mm in diameter, but demonstrates distal tapering. This is presumably chronic. Pancreas: Unremarkable. No pancreatic ductal dilatation or surrounding inflammatory changes. Spleen: Spleen surgically absent. There is a small amount of residual/regenerated splenic tissue in the left upper quadrant. Adrenals/Urinary Tract: No adrenal masses. 4 mm low-density lesion in the  lower pole left kidney, too small to fully characterize, but consistent with a cyst. No other renal masses, no stones and no hydronephrosis. Ureters are normal course and in caliber. Bladder is unremarkable. Stomach/Bowel: There are is inflammatory stranding adjacent to the lower sigmoid  colon centered on the a diverticulum. There is no extraluminal air and no fluid collection is seen to suggest an abscess. There are other colonic diverticular without associated inflammation. Stomach and small bowel are unremarkable. Normal appendix visualized. Vascular/Lymphatic: No significant vascular findings are present. No enlarged abdominal or pelvic lymph nodes. Reproductive: Status post hysterectomy. No adnexal masses. Other: No abdominal wall hernia or abnormality. No abdominopelvic ascites. Musculoskeletal: No acute or significant osseous findings. IMPRESSION: 1. Mild uncomplicated sigmoid diverticulitis. No abscess or free air. 2. No other acute abnormalities within the abdomen or pelvis. 3. Intra and extrahepatic bile duct dilation in this patient who has had a prior cholecystectomy. This is presumed chronic. Electronically Signed   By: Lajean Manes M.D.   On: 11/30/2016 21:50   X-rays viewed by me Results for orders placed or performed during the hospital encounter of 12/02/16  Comprehensive metabolic panel  Result Value Ref Range   Sodium 141 135 - 145 mmol/L   Potassium 3.3 (L) 3.5 - 5.1 mmol/L   Chloride 107 101 - 111 mmol/L   CO2 24 22 - 32 mmol/L   Glucose, Bld 241 (H) 65 - 99 mg/dL   BUN 8 6 - 20 mg/dL   Creatinine, Ser 0.71 0.44 - 1.00 mg/dL   Calcium 9.7 8.9 - 10.3 mg/dL   Total Protein 7.3 6.5 - 8.1 g/dL   Albumin 3.6 3.5 - 5.0 g/dL   AST 26 15 - 41 U/L   ALT 14 14 - 54 U/L   Alkaline Phosphatase 75 38 - 126 U/L   Total Bilirubin 0.4 0.3 - 1.2 mg/dL   GFR calc non Af Amer >60 >60 mL/min   GFR calc Af Amer >60 >60 mL/min   Anion gap 10 5 - 15  CBC with Differential/Platelet  Result  Value Ref Range   WBC 8.9 4.0 - 10.5 K/uL   RBC 3.99 3.87 - 5.11 MIL/uL   Hemoglobin 10.9 (L) 12.0 - 15.0 g/dL   HCT 35.4 (L) 36.0 - 46.0 %   MCV 88.7 78.0 - 100.0 fL   MCH 27.3 26.0 - 34.0 pg   MCHC 30.8 30.0 - 36.0 g/dL   RDW 14.8 11.5 - 15.5 %   Platelets 489 (H) 150 - 400 K/uL   Neutrophils Relative % 46 %   Neutro Abs 4.1 1.7 - 7.7 K/uL   Lymphocytes Relative 37 %   Lymphs Abs 3.3 0.7 - 4.0 K/uL   Monocytes Relative 14 %   Monocytes Absolute 1.3 (H) 0.1 - 1.0 K/uL   Eosinophils Relative 3 %   Eosinophils Absolute 0.2 0.0 - 0.7 K/uL   Basophils Relative 0 %   Basophils Absolute 0.0 0.0 - 0.1 K/uL   Ct Abdomen Pelvis W Contrast  Result Date: 11/30/2016 CLINICAL DATA:  Hervey Ard, intern and lower abdominal pain with pressure that radiates to the back. Symptoms began a few days ago. There is associated nausea. EXAM: CT ABDOMEN AND PELVIS WITH CONTRAST TECHNIQUE: Multidetector CT imaging of the abdomen and pelvis was performed using the standard protocol following bolus administration of intravenous contrast. CONTRAST:  161mL ISOVUE-300 IOPAMIDOL (ISOVUE-300) INJECTION 61% COMPARISON:  None. FINDINGS: Lower chest: Clear lung bases.  Heart normal size. Hepatobiliary: Liver is unremarkable. Gallbladder surgically absent. There is intern extrahepatic bile duct dilation. Common bile duct measures a maximum of 12 mm in diameter, but demonstrates distal tapering. This is presumably chronic. Pancreas: Unremarkable. No pancreatic ductal dilatation or surrounding inflammatory changes. Spleen: Spleen surgically absent. There  is a small amount of residual/regenerated splenic tissue in the left upper quadrant. Adrenals/Urinary Tract: No adrenal masses. 4 mm low-density lesion in the lower pole left kidney, too small to fully characterize, but consistent with a cyst. No other renal masses, no stones and no hydronephrosis. Ureters are normal course and in caliber. Bladder is unremarkable. Stomach/Bowel: There  are is inflammatory stranding adjacent to the lower sigmoid colon centered on the a diverticulum. There is no extraluminal air and no fluid collection is seen to suggest an abscess. There are other colonic diverticular without associated inflammation. Stomach and small bowel are unremarkable. Normal appendix visualized. Vascular/Lymphatic: No significant vascular findings are present. No enlarged abdominal or pelvic lymph nodes. Reproductive: Status post hysterectomy. No adnexal masses. Other: No abdominal wall hernia or abnormality. No abdominopelvic ascites. Musculoskeletal: No acute or significant osseous findings. IMPRESSION: 1. Mild uncomplicated sigmoid diverticulitis. No abscess or free air. 2. No other acute abnormalities within the abdomen or pelvis. 3. Intra and extrahepatic bile duct dilation in this patient who has had a prior cholecystectomy. This is presumed chronic. Electronically Signed   By: Lajean Manes M.D.   On: 11/30/2016 21:50   Dg Abd Acute W/chest  Result Date: 12/02/2016 CLINICAL DATA:  61 year old female with abdominal pain for 2 days. History of diverticulitis. EXAM: DG ABDOMEN ACUTE W/ 1V CHEST COMPARISON:  None. FINDINGS: There is no evidence of dilated bowel loops or free intraperitoneal air. There is a moderate stool burden, particularly in the ascending colon. No radiopaque calculi or other significant radiographic abnormality is seen. Heart size and mediastinal contours are within normal limits. Both lungs are clear. IMPRESSION: 1. No acute intra-abdominal or intrathoracic pathology within the limits of the study. 2. Moderate stool burden. Electronically Signed   By: Kristopher Oppenheim M.D.   On: 12/02/2016 17:43   Procedures Procedures (including critical care time)  Medications Ordered in ED Medications - No data to display   Initial Impression / Assessment and Plan / ED Course  I have reviewed the triage vital signs and the nursing notes.  Pertinent labs & imaging  results that were available during my care of the patient were reviewed by me and considered in my medical decision making (see chart for details).     5:50 PM feels improved after treatment with intravenous morphine.  Feels ready to go home.  She will receive potassium chloride 40 medical events by mouth prior to discharge.  She is instructed to continue with antibiotics as prescribed.  She has an appointment with her primary care physician scheduled for December 08, 2016 which she is encouraged to keep.  Suggest blood pressure recheck.  Advised to return for vomiting fever.  She is told that she can take her Percocet as prescribed  Final Clinical Impressions(s) / ED Diagnoses  Dx#1 lower abdominal pain #2 hyperglycemia #3 hypokalemia #4 elevated blood pressure Final diagnoses:  None    ED Discharge Orders    None       Orlie Dakin, MD 12/02/16 1800

## 2016-12-02 NOTE — ED Notes (Signed)
Pt states just started taking her antibiotics today for her diverticulitis that she was diagnosed with on thanksgiving day. Pt states pain was okay yesterday but got worse today.

## 2016-12-02 NOTE — ED Triage Notes (Signed)
Lower abd pain.  Pt was here x2 days ago and dx with diverticulitis.  Pt started taking abx. States her Dr told her to come the ED

## 2016-12-21 ENCOUNTER — Ambulatory Visit (INDEPENDENT_AMBULATORY_CARE_PROVIDER_SITE_OTHER): Payer: Medicare Other | Admitting: Podiatry

## 2016-12-21 ENCOUNTER — Encounter: Payer: Self-pay | Admitting: Podiatry

## 2016-12-21 DIAGNOSIS — B351 Tinea unguium: Secondary | ICD-10-CM

## 2016-12-21 DIAGNOSIS — E1149 Type 2 diabetes mellitus with other diabetic neurological complication: Secondary | ICD-10-CM

## 2016-12-21 DIAGNOSIS — E119 Type 2 diabetes mellitus without complications: Secondary | ICD-10-CM

## 2016-12-21 DIAGNOSIS — M79674 Pain in right toe(s): Secondary | ICD-10-CM

## 2016-12-21 DIAGNOSIS — M79675 Pain in left toe(s): Secondary | ICD-10-CM | POA: Diagnosis not present

## 2016-12-21 MED ORDER — CICLOPIROX 8 % EX SOLN: Freq: Every day | CUTANEOUS | 2 refills | Status: DC

## 2016-12-21 MED ORDER — PREGABALIN 150 MG PO CAPS: 150.0000 mg | ORAL_CAPSULE | Freq: Two times a day (BID) | ORAL | 0 refills | Status: DC
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
## 2017-01-13 ENCOUNTER — Encounter (HOSPITAL_COMMUNITY): Payer: Self-pay | Admitting: *Deleted

## 2017-01-13 DIAGNOSIS — Z7902 Long term (current) use of antithrombotics/antiplatelets: Secondary | ICD-10-CM | POA: Insufficient documentation

## 2017-01-13 DIAGNOSIS — F17210 Nicotine dependence, cigarettes, uncomplicated: Secondary | ICD-10-CM | POA: Diagnosis not present

## 2017-01-13 DIAGNOSIS — K5903 Drug induced constipation: Secondary | ICD-10-CM | POA: Diagnosis not present

## 2017-01-13 DIAGNOSIS — I10 Essential (primary) hypertension: Secondary | ICD-10-CM | POA: Insufficient documentation

## 2017-01-13 DIAGNOSIS — Z79899 Other long term (current) drug therapy: Secondary | ICD-10-CM | POA: Insufficient documentation

## 2017-01-13 DIAGNOSIS — R1084 Generalized abdominal pain: Secondary | ICD-10-CM | POA: Diagnosis not present

## 2017-01-13 DIAGNOSIS — E119 Type 2 diabetes mellitus without complications: Secondary | ICD-10-CM | POA: Insufficient documentation

## 2017-01-13 DIAGNOSIS — R1030 Lower abdominal pain, unspecified: Secondary | ICD-10-CM | POA: Diagnosis present

## 2017-01-13 DIAGNOSIS — E039 Hypothyroidism, unspecified: Secondary | ICD-10-CM | POA: Diagnosis not present

## 2017-01-13 DIAGNOSIS — Z7984 Long term (current) use of oral hypoglycemic drugs: Secondary | ICD-10-CM | POA: Diagnosis not present

## 2017-01-13 NOTE — ED Triage Notes (Signed)
Pt arrives via [facility]. She reports lower abdominal pain and lower back pain that started 2 hours ago, associated with nausea.  Pt reports it has been several days since she has had a bowel movement. No meds PTA.

## 2017-01-14 ENCOUNTER — Emergency Department (HOSPITAL_COMMUNITY): Payer: Medicare Other

## 2017-01-14 ENCOUNTER — Emergency Department (HOSPITAL_COMMUNITY)
Admission: EM | Admit: 2017-01-14 | Discharge: 2017-01-14 | Disposition: A | Discharge status: Home / Self Care | Payer: Medicare Other | Attending: Emergency Medicine | Admitting: Emergency Medicine

## 2017-01-14 DIAGNOSIS — K5903 Drug induced constipation: Secondary | ICD-10-CM

## 2017-01-14 DIAGNOSIS — R1084 Generalized abdominal pain: Secondary | ICD-10-CM | POA: Diagnosis not present

## 2017-01-14 LAB — COMPREHENSIVE METABOLIC PANEL
ALK PHOS: 79 U/L (ref 38–126)
ALT: 17 U/L (ref 14–54)
AST: 26 U/L (ref 15–41)
Albumin: 3.6 g/dL (ref 3.5–5.0)
Anion gap: 9 (ref 5–15)
BILIRUBIN TOTAL: 0.4 mg/dL (ref 0.3–1.2)
BUN: 5 mg/dL — AB (ref 6–20)
CALCIUM: 9.3 mg/dL (ref 8.9–10.3)
CO2: 22 mmol/L (ref 22–32)
CREATININE: 0.59 mg/dL (ref 0.44–1.00)
Chloride: 108 mmol/L (ref 101–111)
GFR calc Af Amer: >60 mL/min (ref >60)
GFR calc non Af Amer: >60 mL/min (ref >60)
Glucose, Bld: 180 mg/dL — ABNORMAL HIGH (ref 65–99)
Potassium: 3.9 mmol/L (ref 3.5–5.1)
Sodium: 139 mmol/L (ref 135–145)
TOTAL PROTEIN: 7.2 g/dL (ref 6.5–8.1)

## 2017-01-14 LAB — CBC WITH DIFFERENTIAL/PLATELET
BASOS ABS: 0 10*3/uL (ref 0.0–0.1)
BASOS PCT: 0 %
EOS PCT: 0 %
Eosinophils Absolute: 0 10*3/uL (ref 0.0–0.7)
HEMATOCRIT: 36.5 % (ref 36.0–46.0)
Hemoglobin: 11.4 g/dL — ABNORMAL LOW (ref 12.0–15.0)
Lymphocytes Relative: 24 %
Lymphs Abs: 3.1 10*3/uL (ref 0.7–4.0)
MCH: 26.8 pg (ref 26.0–34.0)
MCHC: 31.2 g/dL (ref 30.0–36.0)
MCV: 85.9 fL (ref 78.0–100.0)
MONO ABS: 1 10*3/uL (ref 0.1–1.0)
MONOS PCT: 8 %
NEUTROS ABS: 8.7 10*3/uL — AB (ref 1.7–7.7)
Neutrophils Relative %: 68 %
PLATELETS: 470 10*3/uL — AB (ref 150–400)
RBC: 4.25 MIL/uL (ref 3.87–5.11)
RDW: 15.8 % — AB (ref 11.5–15.5)
WBC: 12.8 10*3/uL — ABNORMAL HIGH (ref 4.0–10.5)

## 2017-01-14 LAB — LIPASE, BLOOD: Lipase: 21 U/L (ref 11–51)

## 2017-01-14 MED ORDER — SODIUM CHLORIDE 0.9 % IV BOLUS (SEPSIS)
1000.0000 mL | Freq: Once | INTRAVENOUS | Status: AC
  Administered 2017-01-14: 1000 mL via INTRAVENOUS

## 2017-01-14 MED ORDER — FLEET ENEMA 7-19 GM/118ML RE ENEM
1.0000 | ENEMA | Freq: Once | RECTAL | Status: AC
  Administered 2017-01-14: 1 via RECTAL

## 2017-01-14 MED ORDER — SODIUM CHLORIDE 0.9 % IV BOLUS (SEPSIS)
500.0000 mL | Freq: Once | INTRAVENOUS | Status: AC
  Administered 2017-01-14: 500 mL via INTRAVENOUS

## 2017-01-14 MED ORDER — IOPAMIDOL (ISOVUE-300) INJECTION 61%
100.0000 mL | Freq: Once | INTRAVENOUS | Status: AC | PRN
  Administered 2017-01-14: 100 mL via INTRAVENOUS

## 2017-01-14 MED ORDER — ONDANSETRON HCL 4 MG/2ML IJ SOLN
4.0000 mg | Freq: Once | INTRAMUSCULAR | Status: AC
  Administered 2017-01-14: 4 mg via INTRAVENOUS
  Filled 2017-01-14: qty 2

## 2017-01-14 MED ORDER — METHYLNALTREXONE BROMIDE 12 MG/0.6ML ~~LOC~~ SOLN
12.0000 mg | Freq: Once | SUBCUTANEOUS | Status: DC
  Filled 2017-01-14: qty 0.6

## 2017-01-14 NOTE — Discharge Instructions (Signed)
Get miralax and put one dose or 17 g in 8 ounces of water,  take 1 dose every 30 minutes for 2-3 hours or until you  get good results and then once or twice daily to prevent constipation.  ° °

## 2017-01-14 NOTE — ED Provider Notes (Signed)
[facility] EMERGENCY DEPARTMENT Provider Note   CSN: [id number] Arrival date & time: 01/13/17  2229  Time seen 03:10 AM (was not in her room for several times around 02:07 AM)   History   Chief Complaint Chief Complaint  Patient presents with  . Abdominal Pain    HPI Makenli Derstine is a 62 y.o. female.  HPI patient states she was seen at Thanksgiving and diagnosed with diverticulitis.  The actual date was November 22.  She was treated as an outpatient but states she came back again on the 24th and again was treated as an outpatient.  She states she was doing better however Thursday.  November 3 she states she started getting the pain in her left lower side again.  She has nausea without vomiting or diarrhea.  She states her last bowel movement was a couple days ago and now she complains of being impacting and feeling like her rectum is going to split.  She states she tried to disimpact herself at home without improvement.  She denies any fever.  She states she is chronically on pain medications due to chronic pain in her back and her knees.  PCP Pavelock, Ralene Bathe, MD   Past Medical History:  Diagnosis Date  . Anxiety   . Arthritis   . Bipolar 1 disorder ([place])   . Cataract   . Cocaine abuse ([place])   . Colon polyps   . Depression   . Diabetes mellitus without complication ([place])   . Drug abuse ([place])   . GERD (gastroesophageal reflux disease)   . H. pylori infection   . Helicobacter pylori gastritis 02/08/2016  . Hypercholesteremia   . Hypertension   . Hypothyroid   . Marijuana abuse   . Nerve damage    both legs  . Osteopenia   . Pancreatitis   . Polysubstance abuse ([place])   . Suicidal ideation     Patient Active Problem List   Diagnosis Date Noted  . Acute encephalopathy 10/21/2016  . Tachycardia 10/21/2016  . Chronic pain syndrome 06/10/2016  . MDD (major depressive disorder), recurrent severe, without psychosis ([place]) 06/09/2016  . Left pulmonary embolus ([place])  04/26/2016  . Abnormal urinalysis 04/26/2016  . Diabetes mellitus with complication ([place])   . Helicobacter pylori gastritis 02/08/2016  . Bipolar disorder, mixed, severe, w/o psychotic features ([place]) 09/16/2015  . Cocaine use disorder, severe, dependence ([place]) 09/16/2015  . Cannabis use disorder, mild, abuse 09/16/2015  . Polysubstance dependence ([place]) 09/15/2015  . Suicidal ideation 07/14/2015  . Hypertension 07/14/2015  . Drug overdose 07/14/2015  . Hypokalemia 07/14/2015  . Marijuana abuse 07/14/2015  . Chronic back pain 07/14/2015  . Suicide attempt ([place])   . Herpes simplex 04/28/2015  . Tobacco abuse 04/28/2015  . Chronic bronchitis ([place]) 04/28/2015  . 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 Hyperlipidemia 04/22/2015  . Type 2 diabetes mellitus with hyperglycemia ([place]) 04/22/2015  . Hypothyroidism 04/22/2015    Past Surgical History:  Procedure Laterality Date  . CHOLECYSTECTOMY    . COLONOSCOPY    . KNEE SURGERY Left unknown   surgical scar noted  . PARTIAL HYSTERECTOMY    . ROTATOR CUFF REPAIR Right unknown   per client, surgery scar noted  . SPLENECTOMY Right unknown   per client  . UPPER GASTROINTESTINAL ENDOSCOPY      OB History    No data available       Home Medications    Prior to Admission medications  Medication Sig Start Date End Date Taking? Authorizing Provider  albuterol (PROVENTIL HFA;VENTOLIN HFA) 108 (90 Base) MCG/ACT inhaler Inhale 1-2 puffs into the lungs every 6 (six) hours as needed for wheezing or shortness of breath.    [provider]  buPROPion (WELLBUTRIN XL) 150 MG 24 hr tablet Take 1 tablet (150 mg total) by mouth daily. Patient not taking: Reported on 10/23/2016 06/20/16   Derrill Center, NP  ciclopirox (PENLAC) 8 % solution Apply topically at bedtime. Apply over nail and surrounding skin. Apply daily over previous coat. After seven (7) days, may remove with alcohol and continue cycle. 12/21/16    Trula Slade, DPM  ciprofloxacin (CIPRO) 500 MG tablet Take 1 tablet (500 mg total) by mouth 2 (two) times daily. 11/30/16   Dorie Rank, MD  diclofenac sodium (VOLTAREN) 1 % GEL Apply 2 g topically 4 (four) times daily. Patient not taking: Reported on 12/02/2016 11/02/16   Lavina Hamman, MD  DULoxetine (CYMBALTA) 60 MG capsule Take 1 capsule (60 mg total) by mouth every evening. Patient not taking: Reported on 12/02/2016 06/19/16   Derrill Center, NP  guaiFENesin (MUCINEX) 600 MG 12 hr tablet Take 2 tablets (1,200 mg total) by mouth 2 (two) times daily. Patient not taking: Reported on 12/02/2016 11/02/16   Lavina Hamman, MD  hydrOXYzine (ATARAX/VISTARIL) 25 MG tablet Take 1 tablet (25 mg total) by mouth every 6 (six) hours as needed for anxiety. Take 2 tablets (50mg  total) by mouth as needed for insomnia Patient not taking: Reported on 12/02/2016 06/19/16   Derrill Center, NP  levothyroxine (SYNTHROID, LEVOTHROID) 25 MCG tablet Take 1 tablet (25 mcg total) by mouth daily before breakfast. For thyroid hormone replacement 09/23/15   Lindell Spar I, NP  lidocaine (LIDODERM) 5 % Place 1 patch onto the skin daily. Remove & Discard patch within 12 hours or as directed by MD Patient not taking: Reported on 12/02/2016 11/02/16   Lavina Hamman, MD  lubiprostone (AMITIZA) 24 MCG capsule Take 1 capsule (24 mcg total) by mouth 2 (two) times daily with a meal. Patient not taking: Reported on 12/02/2016 06/19/16   Derrill Center, NP  metFORMIN (GLUCOPHAGE) 500 MG tablet Take 1 tablet (500 mg total) by mouth daily with breakfast. For diabetes management 09/23/15   Lindell Spar I, NP  metroNIDAZOLE (FLAGYL) 500 MG tablet Take 1 tablet (500 mg total) by mouth 3 (three) times daily. 11/30/16   Dorie Rank, MD  mirabegron ER (MYRBETRIQ) 25 MG TB24 tablet Take 25 mg by mouth daily as needed (for urination).    [provider]  nicotine (NICODERM CQ - DOSED IN MG/24 HOURS) 21 mg/24hr patch Place 1  patch (21 mg total) onto the skin daily. Patient not taking: Reported on 10/23/2016 06/20/16   Derrill Center, NP  oxyCODONE-acetaminophen (PERCOCET) 7.5-325 MG tablet Take 1 tablet by mouth every 6 (six) hours as needed for severe pain.    [provider]  pravastatin (PRAVACHOL) 10 MG tablet Take 1 tablet (10 mg total) by mouth at bedtime. Patient taking differently: Take 20 mg by mouth at bedtime.  06/19/16   Derrill Center, NP  pregabalin (LYRICA) 150 MG capsule Take 1 capsule (150 mg total) by mouth 2 (two) times daily. 12/21/16   Trula Slade, DPM  QUEtiapine (SEROQUEL XR) 300 MG 24 hr tablet Take 1 tablet (300 mg total) by mouth at bedtime. 11/02/16   Lavina Hamman, MD  QUEtiapine (SEROQUEL) 25  MG tablet Take 25 mg by mouth 3 (three) times daily.    [provider]  rivaroxaban (XARELTO) 20 MG TABS tablet Take 1 tablet (20 mg total) by mouth daily with supper. Patient not taking: Reported on 12/02/2016 06/19/16   Derrill Center, NP  triamcinolone ointment (KENALOG) 0.1 % Apply 1 application topically 2 (two) times daily as needed (rash).  06/02/16   [provider]  verapamil (CALAN) 40 MG tablet Take 40 mg by mouth every morning.    [provider]    Family History Family History  Problem Relation Age of Onset  . Gallbladder disease Mother        mets  . Diabetes Mother   . Colon polyps Mother   . Hypertension Mother   . Alcoholism Cousin   . Diabetes Father   . Diabetic kidney disease Maternal Aunt   . Diabetes Maternal Aunt     Social History Social History   Tobacco Use  . Smoking status: Current Every Day Smoker    Packs/day: 0.50    Types: Cigarettes  . Smokeless tobacco: Never Used  Substance Use Topics  . Alcohol use: No    Comment: quit  . Drug use: Yes    Types: Codeine, Benzodiazepines, Marijuana, Cocaine    Comment: cocaine used 03/23/16  On disability for chronic pain in her back and knees   Allergies     Patient has no known allergies.   Review of Systems Review of Systems  All other systems reviewed and are negative.    Physical Exam Updated Vital Signs BP (!) 157/79 (BP Location: Right Arm)   Pulse (!) 101   Temp 98.1 F (36.7 C) (Oral)   Resp 20   Ht 5\' 5"  (1.651 m)   Wt 83.9 kg (185 lb)   SpO2 100%   BMI 30.79 kg/m   Vital signs normal sent for hypertension and borderline tachycardia   Physical Exam  Constitutional: She is oriented to person, place, and time. She appears well-developed and well-nourished.  Non-toxic appearance. She does not appear ill. She appears distressed.  HENT:  Head: Normocephalic and atraumatic.  Right Ear: External ear normal.  Left Ear: External ear normal.  Nose: Nose normal. No mucosal edema or rhinorrhea.  Mouth/Throat: Oropharynx is clear and moist and mucous membranes are normal. No dental abscesses or uvula swelling.  Eyes: Conjunctivae and EOM are normal. Pupils are equal, round, and reactive to light.  Neck: Normal range of motion and full passive range of motion without pain. Neck supple.  Cardiovascular: Normal rate, regular rhythm and normal heart sounds. Exam reveals no gallop and no friction rub.  No murmur heard. Pulmonary/Chest: Effort normal and breath sounds normal. No respiratory distress. She has no wheezes. She has no rhonchi. She has no rales. She exhibits no tenderness and no crepitus.  Abdominal: Soft. Normal appearance and bowel sounds are normal. She exhibits no distension. There is no tenderness. There is no rebound and no guarding.  Musculoskeletal: Normal range of motion. She exhibits no edema or tenderness.  Moves all extremities well.   Neurological: She is alert and oriented to person, place, and time. She has normal strength. No cranial nerve deficit.  Skin: Skin is warm, dry and intact. No rash noted. No erythema. No pallor.  Psychiatric: Her mood appears anxious. Her speech is rapid and/or pressured. She is  agitated.  Nursing note and vitals reviewed.    ED Treatments / Results  Labs (all labs  ordered are listed, but only abnormal results are displayed) Results for orders placed or performed during the hospital encounter of 01/14/17  Comprehensive metabolic panel  Result Value Ref Range   Sodium 139 135 - 145 mmol/L   Potassium 3.9 3.5 - 5.1 mmol/L   Chloride 108 101 - 111 mmol/L   CO2 22 22 - 32 mmol/L   Glucose, Bld 180 (H) 65 - 99 mg/dL   BUN 5 (L) 6 - 20 mg/dL   Creatinine, Ser 0.59 0.44 - 1.00 mg/dL   Calcium 9.3 8.9 - 10.3 mg/dL   Total Protein 7.2 6.5 - 8.1 g/dL   Albumin 3.6 3.5 - 5.0 g/dL   AST 26 15 - 41 U/L   ALT 17 14 - 54 U/L   Alkaline Phosphatase 79 38 - 126 U/L   Total Bilirubin 0.4 0.3 - 1.2 mg/dL   GFR calc non Af Amer >60 >60 mL/min   GFR calc Af Amer >60 >60 mL/min   Anion gap 9 5 - 15  Lipase, blood  Result Value Ref Range   Lipase 21 11 - 51 U/L  CBC with Differential  Result Value Ref Range   WBC 12.8 (H) 4.0 - 10.5 K/uL   RBC 4.25 3.87 - 5.11 MIL/uL   Hemoglobin 11.4 (L) 12.0 - 15.0 g/dL   HCT 36.5 36.0 - 46.0 %   MCV 85.9 78.0 - 100.0 fL   MCH 26.8 26.0 - 34.0 pg   MCHC 31.2 30.0 - 36.0 g/dL   RDW 15.8 (H) 11.5 - 15.5 %   Platelets 470 (H) 150 - 400 K/uL   Neutrophils Relative % 68 %   Neutro Abs 8.7 (H) 1.7 - 7.7 K/uL   Lymphocytes Relative 24 %   Lymphs Abs 3.1 0.7 - 4.0 K/uL   Monocytes Relative 8 %   Monocytes Absolute 1.0 0.1 - 1.0 K/uL   Eosinophils Relative 0 %   Eosinophils Absolute 0.0 0.0 - 0.7 K/uL   Basophils Relative 0 %   Basophils Absolute 0.0 0.0 - 0.1 K/uL   Laboratory interpretation all normal except 'leukocytosis and hyperglycemia   EKG  EKG Interpretation None       Radiology Ct Abdomen Pelvis W Contrast  Result Date: 01/14/2017 CLINICAL DATA:  62 y/o  F; sharp lower abdominal pain. EXAM: CT ABDOMEN AND PELVIS WITH CONTRAST TECHNIQUE: Multidetector CT imaging of the abdomen and pelvis was performed using the  standard protocol following bolus administration of intravenous contrast. CONTRAST:  157mL ISOVUE-300 IOPAMIDOL (ISOVUE-300) INJECTION 61% COMPARISON:  11/30/2016 CT abdomen and pelvis FINDINGS: Lower chest: No acute abnormality. Hepatobiliary: No focal liver abnormality is seen. Status post cholecystectomy. Stable 12 mm dilatation of common bile duct and mild intrahepatic biliary ductal dilatation, likely compensatory postcholecystectomy. Pancreas: Unremarkable. No pancreatic ductal dilatation or surrounding inflammatory changes. Spleen: Resected. Adrenals/Urinary Tract: Adrenal glands are unremarkable. Kidneys are normal, without renal calculi, focal lesion, or hydronephrosis. Bladder is unremarkable. Stomach/Bowel: Stomach is within normal limits. Appendix appears normal. No evidence of bowel wall thickening, distention, or inflammatory changes. Mild colonic diverticulosis greatest in the sigmoid segment. Vascular/Lymphatic: No significant vascular findings are present. No enlarged abdominal or pelvic lymph nodes. Reproductive: Status post hysterectomy. No adnexal masses. Other: No abdominal wall hernia or abnormality. No abdominopelvic ascites. Musculoskeletal: No acute or significant osseous findings. IMPRESSION: 1. No acute process identified. 2. Stable intra and extrahepatic mild biliary ductal dilatation, probably compensatory postcholecystectomy. 3. Mild colon diverticulosis greatest in sigmoid segment. No findings of  acute diverticulitis. Electronically Signed   By: Kristine Garbe M.D.   On: 01/14/2017 06:17    Procedures Procedures (including critical care time)  Medications Ordered in ED Medications  ondansetron (ZOFRAN) injection 4 mg (not administered)  methylnaltrexone (RELISTOR) injection 12 mg (12 mg Subcutaneous Not Given 01/14/17 0633)  sodium chloride 0.9 % bolus 1,000 mL (0 mLs Intravenous Stopped 01/14/17 0636)  sodium chloride 0.9 % bolus 500 mL (0 mLs Intravenous Stopped  01/14/17 0636)  sodium phosphate (FLEET) 7-19 GM/118ML enema 1 enema (1 enema Rectal Given 01/14/17 0349)  iopamidol (ISOVUE-300) 61 % injection 100 mL (100 mLs Intravenous Contrast Given 01/14/17 0551)     Initial Impression / Assessment and Plan / ED Course  I have reviewed the triage vital signs and the nursing notes.  Pertinent labs & imaging results that were available during my care of the patient were reviewed by me and considered in my medical decision making (see chart for details).     Patient varies from complaining of being impacted to demanding pain medication.  She was given Relistor subcu for possible opioid-induced constipation.  She had an IV inserted and CT scan was done to see if she has recurrence of her diverticulitis.  Enema was ordered to help soften the stool in her rectum.  Nurses report patient did not hold much of the enema however it did make her have a moderate amount of stool.  Unfortunately the hospital is out of the Relistor so it could not be given.  I reviewed her CT scan and patient is noted to have diffuse stool throughout her colon.  Recheck at 6:30 AM patient is sleeping soundly on her abdomen.  When awakened she states her discomfort is improved.  We discussed her CT results.  She wants to know what was causing her pain and I explained to her of the constipation.  She will need to take laxatives at home and while she is on the pain medication she may need to stay on a laxative.   Review of the [facility] shows patient gets #90 oxycodone 7.5/325 monthly, last filled December 18, #90 Lyrica 150 mg tablets last filled December 13, #60 clonazepam 1 mg tablets last filled December 3 from 3 different providers.  Final Clinical Impressions(s) / ED Diagnoses   Final diagnoses:  Generalized abdominal pain  Drug-induced constipation    ED Discharge Orders    None    OTC miralax  Plan discharge  Rolland Porter, MD, Barbette Or,  MD 01/14/17 [phone]

## 2017-01-18 ENCOUNTER — Telehealth: Payer: Self-pay | Admitting: *Deleted

## 2017-01-18 ENCOUNTER — Ambulatory Visit: Payer: Medicare Other | Admitting: Podiatry

## 2017-01-18 NOTE — Telephone Encounter (Signed)
Pt states she has a problem with her last visit.

## 2017-01-18 NOTE — Telephone Encounter (Addendum)
I spoke with pt and she states at yesterday's appt she waited a good long time to be brought back, then the doctor came in and trimmed her toenails and told her someone would be in to sand them down and test her blood sugar, and after they left her in there for an hour she was told the glucometer could not be found, so she got up and left. Pt wanted to know if that was the way the appts always went. I told her no, that if she would like to have her toenails filed, she could come in today before 5:00pm or tomorrow before 4:00pm and we would file them for her. Pt states she would like the sugar checked too if that was part of the exam. I told her that was not a routine part of the exam, but I would see if someone could also do. Pt states she has an appt to get her diabetic shoes this month. I told her I would make a note on her appt time to file the toenails and check her sugar.

## 2017-02-02 ENCOUNTER — Ambulatory Visit: Payer: Medicare Other | Admitting: Podiatry

## 2017-02-09 ENCOUNTER — Ambulatory Visit: Payer: Medicare Other | Admitting: Podiatry

## 2017-02-13 ENCOUNTER — Ambulatory Visit (INDEPENDENT_AMBULATORY_CARE_PROVIDER_SITE_OTHER): Payer: Medicare Other | Admitting: Podiatry

## 2017-02-13 DIAGNOSIS — B351 Tinea unguium: Secondary | ICD-10-CM

## 2017-02-13 DIAGNOSIS — L819 Disorder of pigmentation, unspecified: Secondary | ICD-10-CM | POA: Diagnosis not present

## 2017-02-13 DIAGNOSIS — E1149 Type 2 diabetes mellitus with other diabetic neurological complication: Secondary | ICD-10-CM

## 2017-02-13 MED ORDER — PREGABALIN 150 MG PO CAPS: 150.0000 mg | ORAL_CAPSULE | Freq: Two times a day (BID) | ORAL | 0 refills | Status: DC

## 2017-02-13 MED ORDER — CICLOPIROX 8 % EX SOLN: Freq: Every day | CUTANEOUS | 2 refills | Status: AC

## 2017-02-14 ENCOUNTER — Telehealth: Payer: Self-pay | Admitting: Podiatry

## 2017-02-14 NOTE — Progress Notes (Addendum)
Subjective: Melanie Foster presents the office as a walk-in appointment for concerns of pins and needles to her feet.  She states that she is taking the Lyrica 150 mg twice a day but she is out of the medication she is asking for refill.  She is also asking for refill of the polyps from the nail to help with the nail fungus.  She denies any recent injury or trauma.  She states that she is concerned that she has noticed some darkened discoloration of the top of both of her feet and her mom had a similar issue.  She denies any claudication symptoms.  She would also like to be measured for diabetic shoes. She has no other concerns. Denies any systemic complaints such as fevers, chills, nausea, vomiting. No acute changes since last appointment, and no other complaints at this time.   Objective: AAO x3, NAD DP/PT pulses palpable bilaterally, CRT less than 3 seconds Mild swelling to bilateral foot and ankle there is no significant discoloration of the skin.  Subjectively she states that she has noticed the skin becoming darker.  She denies any claudication symptoms.  Her nails appear to be dystrophic, discolored with ill-defined discoloration.  There is no pain in the nails there is no surrounding redness or drainage.  There is no other areas of pinpoint tenderness there is no pain to vibratory sensation. Hammertoes present No open lesions or pre-ulcerative lesions.  No pain with calf compression, swelling, warmth, erythema  Assessment: Onychomycosis; neuropathy: skin discoloration  Plan: -All treatment options discussed with the patient including all alternatives, risks, complications.  -Nails debrided x 10 without complications at her request. Refilled [place].  -Refilled Lyrica -Ordered ABI to check circulation -Measured for diabetic shoes today. Paperwork was completed today for pre-certification. Given diabetes with neuropathy, digital deformity I do think she would benefit from diabetic shoes/inserts.   -Patient encouraged to call the office with any questions, concerns, change in symptoms.   Trula Slade DPM

## 2017-02-14 NOTE — Telephone Encounter (Signed)
Called pt and gave her information about insurance coverage for diabetic shoes. Pt will be responsible for 20% estimated at about $80.. Pt is aware and would like to proceed with ordering of the diabetic shoes.

## 2017-04-23 ENCOUNTER — Ambulatory Visit: Payer: Self-pay | Admitting: Internal Medicine

## 2017-04-24 ENCOUNTER — Telehealth: Payer: Self-pay

## 2017-04-24 NOTE — Telephone Encounter (Signed)
No show letter mailed to patient and I also called her PCP-Dr Pavelock's office to inform them since they set up the appt.

## 2017-10-05 ENCOUNTER — Ambulatory Visit (INDEPENDENT_AMBULATORY_CARE_PROVIDER_SITE_OTHER): Payer: Medicare Other | Admitting: Gastroenterology

## 2017-10-05 ENCOUNTER — Other Ambulatory Visit: Payer: Medicare Other

## 2017-10-05 ENCOUNTER — Encounter: Payer: Self-pay | Admitting: Gastroenterology

## 2017-10-05 ENCOUNTER — Encounter (INDEPENDENT_AMBULATORY_CARE_PROVIDER_SITE_OTHER): Payer: Self-pay

## 2017-10-05 VITALS — BP 114/70 | HR 80 | Ht 63.78 in | Wt 148.1 lb

## 2017-10-05 DIAGNOSIS — R634 Abnormal weight loss: Secondary | ICD-10-CM

## 2017-10-05 DIAGNOSIS — Z8619 Personal history of other infectious and parasitic diseases: Secondary | ICD-10-CM

## 2017-10-05 DIAGNOSIS — R1013 Epigastric pain: Secondary | ICD-10-CM | POA: Diagnosis not present

## 2017-10-05 DIAGNOSIS — R159 Full incontinence of feces: Secondary | ICD-10-CM

## 2017-10-05 MED ORDER — PANTOPRAZOLE SODIUM 40 MG PO TBEC: 40.0000 mg | DELAYED_RELEASE_TABLET | Freq: Every day | ORAL | 3 refills | Status: AC

## 2017-10-05 NOTE — Patient Instructions (Addendum)
If you are age 61 or older, your body mass index should be between 23-30. Your Body mass index is 25.6 kg/m. If this is out of the aforementioned range listed, please consider follow up with your Primary Care Provider.  If you are age 54 or younger, your body mass index should be between 19-25. Your Body mass index is 25.6 kg/m. If this is out of the aformentioned range listed, please consider follow up with your Primary Care Provider.   Your provider has requested that you go to the basement level for lab work before leaving today. Press "B" on the elevator. The lab is located at the first door on the left as you exit the elevator.  We have sent the following medications to your pharmacy for you to pick up at your convenience: Pantoprazole 40 mg  Start Benefiber or Citrucel 2 tsp. Daily mixed with  8 ounces of liquid. Start Miralax 17 grams daily mixed with 8 ounces of liquid.  Follow up with Dr. Carlean Purl on October 10, 2017 at 9:30 am.  Thank you for choosing me and [facility].   Alonza Bogus, PA-C

## 2017-10-05 NOTE — Progress Notes (Signed)
10/05/2017 Melanie Foster [id number] 1955-11-23   HISTORY OF PRESENT ILLNESS:  This is a 62 year old female who is a patient of Dr. Celesta Aver.  She had a colonoscopy and EGD in 01/2016.  EGD revealed erythematous mucosa in the antrum; Hpylori was positive and she was treated.  Colonoscopy revealed one polyp that was removed, pathology actually only showed polypoid colorectal mucosa.  CT scan abdomen and pelvis with contrast in 01/2017  Showed the following:  IMPRESSION: 1. No acute process identified. 2. Stable intra and extrahepatic mild biliary ductal dilatation, probably compensatory postcholecystectomy. 3. Mild colon diverticulosis greatest in sigmoid segment. No findings of acute diverticulitis.  LFT's and lipase were normal at that time.  Tells me that she took all of her Hpylori medication in 01/2016 but does not look like we ever confirmed eradication.  Anyway, she is here today with several complaints including epigastric abdominal pain for the past 2-3 months.  She thinks that her Hpylori is an issue again.  Complains of constipation and bloating, but then also leaks stool at times.  She reports having lost 37 pounds in the past 8 months.  Our scale actually has her down about 40 pounds since 12/2015.  Says that she is under a lot of stress, no appetite.  Not on any GI medications.  Has a lot of psychiatric diagnoses and is on several psych meds.   Past Medical History:  Diagnosis Date  . Anxiety   . Arthritis   . Bipolar 1 disorder ([place])   . Cataract   . Cocaine abuse ([place])   . Colon polyps   . Depression   . Diabetes mellitus without complication ([place])   . Diverticulosis   . Drug abuse ([place])   . GERD (gastroesophageal reflux disease)   . H. pylori infection   . Helicobacter pylori gastritis 02/08/2016  . Hypercholesteremia   . Hypertension   . Hypothyroid   . Marijuana abuse   . Nerve damage    both legs  . Osteopenia   . Pancreatitis   . Polysubstance abuse  ([place])   . Suicidal ideation    Past Surgical History:  Procedure Laterality Date  . CHOLECYSTECTOMY    . COLONOSCOPY    . KNEE SURGERY Left unknown   surgical scar noted  . PARTIAL HYSTERECTOMY    . ROTATOR CUFF REPAIR Right unknown   per client, surgery scar noted  . SPLENECTOMY Right unknown   per client  . UPPER GASTROINTESTINAL ENDOSCOPY      reports that she has been smoking cigarettes. She has been smoking about 0.50 packs per day. She has never used smokeless tobacco. She reports that she has current or past drug history. Drugs: Codeine, Benzodiazepines, Marijuana, and Cocaine. She reports that she does not drink alcohol. family history includes Alcoholism in her cousin; Colon polyps in her mother; Diabetes in her father, maternal aunt, and mother; Diabetic kidney disease in her maternal aunt; Gallbladder disease in her mother; Hypertension in her mother. No Known Allergies    Outpatient Encounter Medications as of 10/05/2017  Medication Sig  . albuterol (PROVENTIL HFA;VENTOLIN HFA) 108 (90 Base) MCG/ACT inhaler Inhale 1-2 puffs into the lungs every 6 (six) hours as needed for wheezing or shortness of breath.  . ciclopirox (PENLAC) 8 % solution Apply topically at bedtime. Apply over nail and surrounding skin. Apply daily over previous coat. After seven (7) days, may remove with alcohol and continue cycle.  . diclofenac sodium (VOLTAREN) 1 %  GEL Apply 2 g topically 4 (four) times daily.  . DULoxetine (CYMBALTA) 60 MG capsule Take 1 capsule (60 mg total) by mouth every evening.  . hydrOXYzine (ATARAX/VISTARIL) 25 MG tablet Take 1 tablet (25 mg total) by mouth every 6 (six) hours as needed for anxiety. Take 2 tablets (50mg  total) by mouth as needed for insomnia  . levothyroxine (SYNTHROID, LEVOTHROID) 25 MCG tablet Take 1 tablet (25 mcg total) by mouth daily before breakfast. For thyroid hormone replacement  . metFORMIN (GLUCOPHAGE) 500 MG tablet Take 1 tablet (500 mg total) by mouth  daily with breakfast. For diabetes management  . nicotine (NICODERM CQ - DOSED IN MG/24 HOURS) 21 mg/24hr patch Place 1 patch (21 mg total) onto the skin daily.  Marland Kitchen oxyCODONE-acetaminophen (PERCOCET) 7.5-325 MG tablet Take 1 tablet by mouth every 6 (six) hours as needed for severe pain.  . pravastatin (PRAVACHOL) 10 MG tablet Take 1 tablet (10 mg total) by mouth at bedtime. (Patient taking differently: Take 20 mg by mouth at bedtime. )  . pregabalin (LYRICA) 150 MG capsule Take 1 capsule (150 mg total) by mouth 2 (two) times daily.  . QUEtiapine (SEROQUEL XR) 300 MG 24 hr tablet Take 1 tablet (300 mg total) by mouth at bedtime.  Marland Kitchen QUEtiapine (SEROQUEL) 25 MG tablet Take 25 mg by mouth 3 (three) times daily.  . rivaroxaban (XARELTO) 20 MG TABS tablet Take 1 tablet (20 mg total) by mouth daily with supper.  . verapamil (CALAN) 40 MG tablet Take 40 mg by mouth every morning.  . [DISCONTINUED] buPROPion (WELLBUTRIN XL) 150 MG 24 hr tablet Take 1 tablet (150 mg total) by mouth daily. (Patient not taking: Reported on 10/23/2016)  . [DISCONTINUED] ciclopirox (PENLAC) 8 % solution Apply topically at bedtime. Apply over nail and surrounding skin. Apply daily over previous coat. After seven (7) days, may remove with alcohol and continue cycle.  . [DISCONTINUED] ciprofloxacin (CIPRO) 500 MG tablet Take 1 tablet (500 mg total) by mouth 2 (two) times daily.  . [DISCONTINUED] guaiFENesin (MUCINEX) 600 MG 12 hr tablet Take 2 tablets (1,200 mg total) by mouth 2 (two) times daily. (Patient not taking: Reported on 12/02/2016)  . [DISCONTINUED] lidocaine (LIDODERM) 5 % Place 1 patch onto the skin daily. Remove & Discard patch within 12 hours or as directed by MD (Patient not taking: Reported on 12/02/2016)  . [DISCONTINUED] lubiprostone (AMITIZA) 24 MCG capsule Take 1 capsule (24 mcg total) by mouth 2 (two) times daily with a meal. (Patient not taking: Reported on 12/02/2016)  . [DISCONTINUED] metroNIDAZOLE (FLAGYL) 500  MG tablet Take 1 tablet (500 mg total) by mouth 3 (three) times daily.  . [DISCONTINUED] mirabegron ER (MYRBETRIQ) 25 MG TB24 tablet Take 25 mg by mouth daily as needed (for urination).  . [DISCONTINUED] triamcinolone ointment (KENALOG) 0.1 % Apply 1 application topically 2 (two) times daily as needed (rash).    No facility-administered encounter medications on file as of 10/05/2017.      REVIEW OF SYSTEMS  : All other systems reviewed and negative except where noted in the History of Present Illness.   PHYSICAL EXAM: BP 114/70 (BP Location: Left Arm, Patient Position: Sitting, Cuff Size: Normal)   Pulse 80   Ht 5' 3.78" (1.62 m)   Wt 148 lb 2 oz (67.2 kg)   BMI 25.60 kg/m  General: Well developed white female in no acute distress Head: Normocephalic and atraumatic Eyes:  Sclerae anicteric, conjunctiva pink. Ears: Normal auditory acuity Lungs: Clear throughout to  auscultation; no increased WOB. Heart: Regular rate and rhythm; no M/R/G. Abdomen: Soft, non-distended.  BS present.  Non-tender. Musculoskeletal: Symmetrical with no gross deformities  Skin: No lesions on visible extremities Extremities: No edema  Neurological: Alert oriented x 4, grossly non-focal Psychological:  Alert and cooperative. Depressed mood and flat affect.  ASSESSMENT AND PLAN: *62 year old female with multiple complaints including epigastric abdominal pain in the setting of previous Hpylori infection, bloating, weight loss, constipation, and fecal leakage.  I think that weight loss is likely due to stress and depression.  She's had EGD and colonoscopy less than 2 years ago and CT scan abdomen and pelvis earlier this year.  Will check stool for Hpylori Ag.  Will start pantoprazole 40 mg daily, but not to start until after stool study is collected.  I would like her to begin Miralax daily as well as a powder fiber supplement daily such as Benefiber or Citrucel to help bulk her stools.  She will follow-up with Dr.  Carlean Purl in about 6 weeks.  CC:  Pavelock, Ralene Bathe, MD

## 2017-10-10 ENCOUNTER — Ambulatory Visit: Payer: Self-pay | Admitting: Internal Medicine

## 2017-10-18 ENCOUNTER — Encounter: Payer: Self-pay | Admitting: Gastroenterology

## 2017-10-18 DIAGNOSIS — Z8619 Personal history of other infectious and parasitic diseases: Secondary | ICD-10-CM | POA: Insufficient documentation

## 2017-10-18 DIAGNOSIS — R1013 Epigastric pain: Secondary | ICD-10-CM | POA: Insufficient documentation

## 2017-10-18 DIAGNOSIS — R634 Abnormal weight loss: Secondary | ICD-10-CM | POA: Insufficient documentation

## 2017-10-18 DIAGNOSIS — R159 Full incontinence of feces: Secondary | ICD-10-CM | POA: Insufficient documentation

## 2017-11-19 ENCOUNTER — Ambulatory Visit: Payer: Self-pay | Admitting: Internal Medicine

## 2017-12-03 ENCOUNTER — Emergency Department (HOSPITAL_COMMUNITY)
Admission: EM | Admit: 2017-12-03 | Discharge: 2017-12-03 | Disposition: A | Discharge status: Home / Self Care | Payer: Medicare Other | Attending: Emergency Medicine | Admitting: Emergency Medicine

## 2017-12-03 ENCOUNTER — Other Ambulatory Visit: Payer: Self-pay

## 2017-12-03 ENCOUNTER — Emergency Department (HOSPITAL_COMMUNITY)
Admission: EM | Admit: 2017-12-03 | Discharge: 2017-12-04 | Discharge status: Against Medical Advice | Payer: Medicare Other | Source: Home / Self Care

## 2017-12-03 ENCOUNTER — Encounter (HOSPITAL_COMMUNITY): Payer: Self-pay

## 2017-12-03 ENCOUNTER — Encounter (HOSPITAL_COMMUNITY): Payer: Self-pay | Admitting: Emergency Medicine

## 2017-12-03 DIAGNOSIS — Z7984 Long term (current) use of oral hypoglycemic drugs: Secondary | ICD-10-CM | POA: Diagnosis not present

## 2017-12-03 DIAGNOSIS — I10 Essential (primary) hypertension: Secondary | ICD-10-CM | POA: Insufficient documentation

## 2017-12-03 DIAGNOSIS — E039 Hypothyroidism, unspecified: Secondary | ICD-10-CM | POA: Insufficient documentation

## 2017-12-03 DIAGNOSIS — F17210 Nicotine dependence, cigarettes, uncomplicated: Secondary | ICD-10-CM | POA: Diagnosis not present

## 2017-12-03 DIAGNOSIS — Z760 Encounter for issue of repeat prescription: Secondary | ICD-10-CM | POA: Diagnosis not present

## 2017-12-03 DIAGNOSIS — G8929 Other chronic pain: Secondary | ICD-10-CM

## 2017-12-03 DIAGNOSIS — E1140 Type 2 diabetes mellitus with diabetic neuropathy, unspecified: Secondary | ICD-10-CM | POA: Insufficient documentation

## 2017-12-03 DIAGNOSIS — M545 Low back pain, unspecified: Secondary | ICD-10-CM

## 2017-12-03 DIAGNOSIS — Z79899 Other long term (current) drug therapy: Secondary | ICD-10-CM | POA: Insufficient documentation

## 2017-12-03 MED ORDER — PREGABALIN 150 MG PO CAPS: 150.0000 mg | ORAL_CAPSULE | Freq: Two times a day (BID) | ORAL | 0 refills | Status: AC

## 2017-12-03 MED ORDER — GABAPENTIN 400 MG PO CAPS: 400.0000 mg | ORAL_CAPSULE | Freq: Three times a day (TID) | ORAL | 0 refills | Status: AC | PRN

## 2017-12-03 MED ORDER — VERAPAMIL HCL 40 MG PO TABS: 40.0000 mg | ORAL_TABLET | Freq: Every morning | ORAL | 0 refills | Status: AC

## 2017-12-03 MED ORDER — DULOXETINE HCL 60 MG PO CPEP: 60.0000 mg | ORAL_CAPSULE | Freq: Every evening | ORAL | 0 refills | Status: AC

## 2017-12-03 MED ORDER — METFORMIN HCL 500 MG PO TABS: 500.0000 mg | ORAL_TABLET | Freq: Every day | ORAL | 0 refills | Status: AC

## 2017-12-03 NOTE — ED Triage Notes (Signed)
Pt comes to ed via pov, c/o of depression and trouble at home. Request social work and medications for anxiety. Pt wants help with finding peace of mind.
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
## 2017-12-03 NOTE — ED Triage Notes (Signed)
Patient c/o headache, dizziness, lower back, neck, and feet. Generalized pain. Patient fell a couple days ago trying to get out the shower. Denies LOC. Patient is out of pain medication for everything.   A/OX4.  Ambulatory in triage.
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
## 2017-12-08 ENCOUNTER — Other Ambulatory Visit: Payer: Self-pay

## 2017-12-08 ENCOUNTER — Encounter (HOSPITAL_COMMUNITY): Payer: Self-pay

## 2017-12-08 ENCOUNTER — Emergency Department (HOSPITAL_COMMUNITY)
Admission: EM | Admit: 2017-12-08 | Discharge: 2017-12-09 | Disposition: A | Discharge status: Home / Self Care | Payer: Medicare Other | Attending: Emergency Medicine | Admitting: Emergency Medicine

## 2017-12-08 DIAGNOSIS — D72829 Elevated white blood cell count, unspecified: Secondary | ICD-10-CM | POA: Diagnosis not present

## 2017-12-08 DIAGNOSIS — F17210 Nicotine dependence, cigarettes, uncomplicated: Secondary | ICD-10-CM | POA: Diagnosis not present

## 2017-12-08 DIAGNOSIS — F319 Bipolar disorder, unspecified: Secondary | ICD-10-CM | POA: Diagnosis not present

## 2017-12-08 DIAGNOSIS — N12 Tubulo-interstitial nephritis, not specified as acute or chronic: Secondary | ICD-10-CM | POA: Insufficient documentation

## 2017-12-08 DIAGNOSIS — E785 Hyperlipidemia, unspecified: Secondary | ICD-10-CM | POA: Insufficient documentation

## 2017-12-08 DIAGNOSIS — Z79899 Other long term (current) drug therapy: Secondary | ICD-10-CM | POA: Insufficient documentation

## 2017-12-08 DIAGNOSIS — I10 Essential (primary) hypertension: Secondary | ICD-10-CM | POA: Diagnosis not present

## 2017-12-08 DIAGNOSIS — R109 Unspecified abdominal pain: Secondary | ICD-10-CM | POA: Diagnosis present

## 2017-12-08 MED ORDER — KETOROLAC TROMETHAMINE 30 MG/ML IJ SOLN
30.0000 mg | Freq: Once | INTRAMUSCULAR | Status: AC
  Administered 2017-12-09: 30 mg via INTRAVENOUS
  Filled 2017-12-08: qty 1

## 2017-12-08 MED ORDER — METHOCARBAMOL 1000 MG/10ML IJ SOLN: 500.0000 mg | Freq: Once | INTRAMUSCULAR | Status: DC

## 2017-12-08 MED ORDER — METHOCARBAMOL 1000 MG/10ML IJ SOLN
500.0000 mg | Freq: Once | INTRAVENOUS | Status: AC
  Administered 2017-12-09: 500 mg via INTRAVENOUS
  Filled 2017-12-08: qty 500

## 2017-12-08 NOTE — ED Triage Notes (Signed)
Pt reports nontraumatic R sided lower back pain that radiates down her R leg. She states that it feels better when she lays on that R hip. Pt ambulated in to the ED without difficulty, but sat in a wheelchair and yelled demanding to be wheeled to the registration desk. A&Ox4.
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
## 2017-12-09 ENCOUNTER — Emergency Department (HOSPITAL_COMMUNITY): Payer: Medicare Other

## 2017-12-09 DIAGNOSIS — N12 Tubulo-interstitial nephritis, not specified as acute or chronic: Secondary | ICD-10-CM | POA: Diagnosis not present

## 2017-12-09 LAB — RAPID URINE DRUG SCREEN, HOSP PERFORMED
AMPHETAMINES: NOT DETECTED
Barbiturates: NOT DETECTED
Benzodiazepines: NOT DETECTED
Cocaine: NOT DETECTED
Opiates: NOT DETECTED
Tetrahydrocannabinol: POSITIVE — AB

## 2017-12-09 LAB — CBC WITH DIFFERENTIAL/PLATELET
Abs Immature Granulocytes: 0.05 10*3/uL (ref 0.00–0.07)
BASOS ABS: 0.1 10*3/uL (ref 0.0–0.1)
Basophils Relative: 1 %
Eosinophils Absolute: 0.2 10*3/uL (ref 0.0–0.5)
Eosinophils Relative: 2 %
HCT: 40.4 % (ref 36.0–46.0)
Hemoglobin: 12.6 g/dL (ref 12.0–15.0)
Immature Granulocytes: 0 %
Lymphocytes Relative: 30 %
Lymphs Abs: 3.4 10*3/uL (ref 0.7–4.0)
MCH: 29.2 pg (ref 26.0–34.0)
MCHC: 31.2 g/dL (ref 30.0–36.0)
MCV: 93.5 fL (ref 80.0–100.0)
MONO ABS: 1.5 10*3/uL — AB (ref 0.1–1.0)
Monocytes Relative: 13 %
Neutro Abs: 6.4 10*3/uL (ref 1.7–7.7)
Neutrophils Relative %: 54 %
Platelets: 424 10*3/uL — ABNORMAL HIGH (ref 150–400)
RBC: 4.32 MIL/uL (ref 3.87–5.11)
RDW: 14.3 % (ref 11.5–15.5)
WBC: 11.6 10*3/uL — ABNORMAL HIGH (ref 4.0–10.5)
nRBC: 0 % (ref 0.0–0.2)

## 2017-12-09 LAB — COMPREHENSIVE METABOLIC PANEL
ALBUMIN: 3.9 g/dL (ref 3.5–5.0)
ALT: 16 U/L (ref 0–44)
AST: 24 U/L (ref 15–41)
Alkaline Phosphatase: 87 U/L (ref 38–126)
Anion gap: 9 (ref 5–15)
BUN: 8 mg/dL (ref 8–23)
CO2: 25 mmol/L (ref 22–32)
Calcium: 9.2 mg/dL (ref 8.9–10.3)
Chloride: 107 mmol/L (ref 98–111)
Creatinine, Ser: 0.58 mg/dL (ref 0.44–1.00)
GFR calc Af Amer: >60 mL/min (ref >60)
GFR calc non Af Amer: >60 mL/min (ref >60)
Glucose, Bld: 99 mg/dL (ref 70–99)
Potassium: 3.8 mmol/L (ref 3.5–5.1)
SODIUM: 141 mmol/L (ref 135–145)
Total Bilirubin: 0.6 mg/dL (ref 0.3–1.2)
Total Protein: 7.3 g/dL (ref 6.5–8.1)

## 2017-12-09 LAB — URINALYSIS, ROUTINE W REFLEX MICROSCOPIC
Bilirubin Urine: NEGATIVE
Glucose, UA: NEGATIVE mg/dL
Ketones, ur: NEGATIVE mg/dL
Nitrite: NEGATIVE
PH: 6 (ref 5.0–8.0)
Protein, ur: 30 mg/dL — AB
RBC / HPF: >50 RBC/hpf — ABNORMAL HIGH (ref 0–5)
Specific Gravity, Urine: 1.01 (ref 1.005–1.030)
WBC, UA: >50 WBC/hpf — ABNORMAL HIGH (ref 0–5)

## 2017-12-09 LAB — LIPASE, BLOOD: Lipase: 28 U/L (ref 11–51)

## 2017-12-09 MED ORDER — ONDANSETRON 4 MG PO TBDP: 4.0000 mg | ORAL_TABLET | Freq: Four times a day (QID) | ORAL | 0 refills | Status: AC | PRN

## 2017-12-09 MED ORDER — CEPHALEXIN 500 MG PO CAPS: 500.0000 mg | ORAL_CAPSULE | Freq: Three times a day (TID) | ORAL | 0 refills | Status: AC

## 2017-12-09 MED ORDER — ACETAMINOPHEN 500 MG PO TABS
1000.0000 mg | ORAL_TABLET | Freq: Once | ORAL | Status: DC
  Filled 2017-12-09: qty 2

## 2017-12-09 MED ORDER — SODIUM CHLORIDE 0.9 % IV SOLN
1.0000 g | Freq: Once | INTRAVENOUS | Status: AC
  Administered 2017-12-09: 1 g via INTRAVENOUS
  Filled 2017-12-09: qty 10

## 2017-12-09 NOTE — ED Notes (Signed)
Went over d/c paper work  Pt signed paper work  Pt was aggressive and asked to leave

## 2017-12-09 NOTE — ED Notes (Signed)
Provider said let sleep and find transport by family member

## 2017-12-09 NOTE — Discharge Instructions (Signed)
You may alternate Tylenol 1000 mg every 6 hours as needed for pain and Ibuprofen 800 mg every 8 hours as needed for pain.  Please take Ibuprofen with food. ° °
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
## 2017-12-11 LAB — URINE CULTURE: Culture: >=100000 — AB

## 2017-12-12 ENCOUNTER — Telehealth: Payer: Self-pay | Admitting: Emergency Medicine

## 2017-12-12 NOTE — Telephone Encounter (Signed)
Post ED Visit - Positive Culture Follow-up  Culture report reviewed by antimicrobial stewardship pharmacist:  []  Elenor Quinones, Pharm.D. []  Heide Guile, Pharm.D., BCPS AQ-ID []  Parks Neptune, Pharm.D., BCPS []  Alycia Rossetti, Pharm.D., BCPS []  [place], Pharm.D., BCPS, AAHIVP []  Legrand Como, Pharm.D., BCPS, AAHIVP []  Salome Arnt, PharmD, BCPS []  Johnnette Gourd, PharmD, BCPS [x]  Hughes Better, PharmD, BCPS []  Leeroy Cha, PharmD  Positive urine culture Treated with Cephalexin, organism sensitive to the same and no further patient follow-up is required at this time.  Larene Beach Otilia Kareem 12/12/2017, 12:38 PM

## 2018-04-02 ENCOUNTER — Ambulatory Visit: Payer: Medicare Other | Admitting: Podiatry

## 2018-10-25 ENCOUNTER — Ambulatory Visit: Payer: Medicare Other | Admitting: Podiatry
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
## 2020-04-20 DIAGNOSIS — Z20822 Contact with and (suspected) exposure to covid-19: Secondary | ICD-10-CM | POA: Diagnosis not present

## 2020-04-20 DIAGNOSIS — W1839XA Other fall on same level, initial encounter: Secondary | ICD-10-CM | POA: Diagnosis not present

## 2020-04-20 DIAGNOSIS — Y999 Unspecified external cause status: Secondary | ICD-10-CM | POA: Diagnosis not present

## 2020-04-20 DIAGNOSIS — R6889 Other general symptoms and signs: Secondary | ICD-10-CM | POA: Diagnosis not present

## 2020-04-20 DIAGNOSIS — Z743 Need for continuous supervision: Secondary | ICD-10-CM | POA: Diagnosis not present

## 2020-04-20 DIAGNOSIS — F32A Depression, unspecified: Secondary | ICD-10-CM | POA: Diagnosis not present

## 2020-04-20 DIAGNOSIS — R0789 Other chest pain: Secondary | ICD-10-CM | POA: Diagnosis not present

## 2020-04-21 DIAGNOSIS — W1839XA Other fall on same level, initial encounter: Secondary | ICD-10-CM | POA: Diagnosis not present

## 2020-04-21 DIAGNOSIS — M545 Low back pain, unspecified: Secondary | ICD-10-CM | POA: Diagnosis not present

## 2020-04-21 DIAGNOSIS — M546 Pain in thoracic spine: Secondary | ICD-10-CM | POA: Diagnosis not present

## 2020-04-21 DIAGNOSIS — R0789 Other chest pain: Secondary | ICD-10-CM | POA: Diagnosis not present

## 2020-04-21 DIAGNOSIS — Z20822 Contact with and (suspected) exposure to covid-19: Secondary | ICD-10-CM | POA: Diagnosis not present

## 2020-04-21 DIAGNOSIS — M542 Cervicalgia: Secondary | ICD-10-CM | POA: Diagnosis not present

## 2020-04-21 DIAGNOSIS — S0990XA Unspecified injury of head, initial encounter: Secondary | ICD-10-CM | POA: Diagnosis not present

## 2020-04-21 DIAGNOSIS — W19XXXA Unspecified fall, initial encounter: Secondary | ICD-10-CM | POA: Diagnosis not present

## 2020-04-21 DIAGNOSIS — R10817 Generalized abdominal tenderness: Secondary | ICD-10-CM | POA: Diagnosis not present

## 2020-04-21 DIAGNOSIS — Y999 Unspecified external cause status: Secondary | ICD-10-CM | POA: Diagnosis not present

## 2020-04-22 DIAGNOSIS — R9431 Abnormal electrocardiogram [ECG] [EKG]: Secondary | ICD-10-CM | POA: Diagnosis not present

## 2020-04-30 DIAGNOSIS — Z888 Allergy status to other drugs, medicaments and biological substances status: Secondary | ICD-10-CM | POA: Diagnosis not present

## 2020-04-30 DIAGNOSIS — Z7984 Long term (current) use of oral hypoglycemic drugs: Secondary | ICD-10-CM | POA: Diagnosis not present

## 2020-04-30 DIAGNOSIS — R6889 Other general symptoms and signs: Secondary | ICD-10-CM | POA: Diagnosis not present

## 2020-04-30 DIAGNOSIS — Z743 Need for continuous supervision: Secondary | ICD-10-CM | POA: Diagnosis not present

## 2020-04-30 DIAGNOSIS — I10 Essential (primary) hypertension: Secondary | ICD-10-CM | POA: Diagnosis not present

## 2020-04-30 DIAGNOSIS — Z885 Allergy status to narcotic agent status: Secondary | ICD-10-CM | POA: Diagnosis not present

## 2020-04-30 DIAGNOSIS — F17210 Nicotine dependence, cigarettes, uncomplicated: Secondary | ICD-10-CM | POA: Diagnosis not present

## 2020-04-30 DIAGNOSIS — Z7951 Long term (current) use of inhaled steroids: Secondary | ICD-10-CM | POA: Diagnosis not present

## 2020-04-30 DIAGNOSIS — R110 Nausea: Secondary | ICD-10-CM | POA: Diagnosis not present

## 2020-04-30 DIAGNOSIS — E1140 Type 2 diabetes mellitus with diabetic neuropathy, unspecified: Secondary | ICD-10-CM | POA: Diagnosis not present

## 2020-04-30 DIAGNOSIS — E079 Disorder of thyroid, unspecified: Secondary | ICD-10-CM | POA: Diagnosis not present

## 2020-04-30 DIAGNOSIS — Z79899 Other long term (current) drug therapy: Secondary | ICD-10-CM | POA: Diagnosis not present

## 2020-04-30 DIAGNOSIS — I499 Cardiac arrhythmia, unspecified: Secondary | ICD-10-CM | POA: Diagnosis not present

## 2020-04-30 DIAGNOSIS — T782XXA Anaphylactic shock, unspecified, initial encounter: Secondary | ICD-10-CM | POA: Diagnosis not present

## 2020-04-30 DIAGNOSIS — T7840XA Allergy, unspecified, initial encounter: Secondary | ICD-10-CM | POA: Diagnosis not present

## 2020-04-30 DIAGNOSIS — Z20822 Contact with and (suspected) exposure to covid-19: Secondary | ICD-10-CM | POA: Diagnosis not present

## 2020-04-30 DIAGNOSIS — J984 Other disorders of lung: Secondary | ICD-10-CM | POA: Diagnosis not present

## 2020-05-01 DIAGNOSIS — E1140 Type 2 diabetes mellitus with diabetic neuropathy, unspecified: Secondary | ICD-10-CM | POA: Diagnosis not present

## 2020-05-01 DIAGNOSIS — F17210 Nicotine dependence, cigarettes, uncomplicated: Secondary | ICD-10-CM | POA: Diagnosis not present

## 2020-05-11 DIAGNOSIS — Z20822 Contact with and (suspected) exposure to covid-19: Secondary | ICD-10-CM | POA: Diagnosis not present

## 2020-05-18 DIAGNOSIS — Z043 Encounter for examination and observation following other accident: Secondary | ICD-10-CM | POA: Diagnosis not present

## 2020-05-18 DIAGNOSIS — M25511 Pain in right shoulder: Secondary | ICD-10-CM | POA: Diagnosis not present

## 2020-05-18 DIAGNOSIS — I10 Essential (primary) hypertension: Secondary | ICD-10-CM | POA: Diagnosis not present

## 2020-05-18 DIAGNOSIS — T148XXA Other injury of unspecified body region, initial encounter: Secondary | ICD-10-CM | POA: Diagnosis not present

## 2020-05-18 DIAGNOSIS — Z79899 Other long term (current) drug therapy: Secondary | ICD-10-CM | POA: Diagnosis not present

## 2020-05-18 DIAGNOSIS — F17210 Nicotine dependence, cigarettes, uncomplicated: Secondary | ICD-10-CM | POA: Diagnosis not present

## 2020-05-18 DIAGNOSIS — Z743 Need for continuous supervision: Secondary | ICD-10-CM | POA: Diagnosis not present

## 2020-05-18 DIAGNOSIS — E079 Disorder of thyroid, unspecified: Secondary | ICD-10-CM | POA: Diagnosis not present

## 2020-05-18 DIAGNOSIS — S300XXA Contusion of lower back and pelvis, initial encounter: Secondary | ICD-10-CM | POA: Diagnosis not present

## 2020-05-18 DIAGNOSIS — Z7984 Long term (current) use of oral hypoglycemic drugs: Secondary | ICD-10-CM | POA: Diagnosis not present

## 2020-05-18 DIAGNOSIS — Z886 Allergy status to analgesic agent status: Secondary | ICD-10-CM | POA: Diagnosis not present

## 2020-05-18 DIAGNOSIS — M542 Cervicalgia: Secondary | ICD-10-CM | POA: Diagnosis not present

## 2020-05-18 DIAGNOSIS — Z888 Allergy status to other drugs, medicaments and biological substances status: Secondary | ICD-10-CM | POA: Diagnosis not present

## 2020-05-18 DIAGNOSIS — E119 Type 2 diabetes mellitus without complications: Secondary | ICD-10-CM | POA: Diagnosis not present

## 2020-05-18 DIAGNOSIS — M25561 Pain in right knee: Secondary | ICD-10-CM | POA: Diagnosis not present

## 2020-05-18 DIAGNOSIS — M11261 Other chondrocalcinosis, right knee: Secondary | ICD-10-CM | POA: Diagnosis not present

## 2020-05-18 DIAGNOSIS — S8001XA Contusion of right knee, initial encounter: Secondary | ICD-10-CM | POA: Diagnosis not present

## 2020-05-18 DIAGNOSIS — M545 Low back pain, unspecified: Secondary | ICD-10-CM | POA: Diagnosis not present

## 2020-05-24 DIAGNOSIS — M2550 Pain in unspecified joint: Secondary | ICD-10-CM | POA: Diagnosis not present

## 2020-05-24 DIAGNOSIS — J449 Chronic obstructive pulmonary disease, unspecified: Secondary | ICD-10-CM | POA: Diagnosis not present

## 2020-05-24 DIAGNOSIS — E1169 Type 2 diabetes mellitus with other specified complication: Secondary | ICD-10-CM | POA: Diagnosis not present

## 2020-05-24 DIAGNOSIS — Z1239 Encounter for other screening for malignant neoplasm of breast: Secondary | ICD-10-CM | POA: Diagnosis not present

## 2020-05-24 DIAGNOSIS — I10 Essential (primary) hypertension: Secondary | ICD-10-CM | POA: Diagnosis not present

## 2020-05-24 DIAGNOSIS — E782 Mixed hyperlipidemia: Secondary | ICD-10-CM | POA: Diagnosis not present

## 2020-05-24 DIAGNOSIS — E039 Hypothyroidism, unspecified: Secondary | ICD-10-CM | POA: Diagnosis not present

## 2020-05-24 DIAGNOSIS — F5101 Primary insomnia: Secondary | ICD-10-CM | POA: Diagnosis not present

## 2020-05-24 DIAGNOSIS — Z7984 Long term (current) use of oral hypoglycemic drugs: Secondary | ICD-10-CM | POA: Diagnosis not present

## 2020-05-27 DIAGNOSIS — Z20822 Contact with and (suspected) exposure to covid-19: Secondary | ICD-10-CM | POA: Diagnosis not present

## 2020-05-29 DIAGNOSIS — Z20822 Contact with and (suspected) exposure to covid-19: Secondary | ICD-10-CM | POA: Diagnosis not present

## 2020-06-03 DIAGNOSIS — Z20822 Contact with and (suspected) exposure to covid-19: Secondary | ICD-10-CM | POA: Diagnosis not present

## 2020-06-04 DIAGNOSIS — M11261 Other chondrocalcinosis, right knee: Secondary | ICD-10-CM | POA: Diagnosis not present

## 2020-06-04 DIAGNOSIS — M542 Cervicalgia: Secondary | ICD-10-CM | POA: Diagnosis not present

## 2020-06-04 DIAGNOSIS — M25561 Pain in right knee: Secondary | ICD-10-CM | POA: Diagnosis not present

## 2020-06-04 DIAGNOSIS — G8929 Other chronic pain: Secondary | ICD-10-CM | POA: Diagnosis not present

## 2020-06-04 DIAGNOSIS — M25461 Effusion, right knee: Secondary | ICD-10-CM | POA: Diagnosis not present

## 2020-06-04 DIAGNOSIS — M1711 Unilateral primary osteoarthritis, right knee: Secondary | ICD-10-CM | POA: Diagnosis not present

## 2020-06-04 DIAGNOSIS — R2689 Other abnormalities of gait and mobility: Secondary | ICD-10-CM | POA: Diagnosis not present

## 2020-06-18 DIAGNOSIS — H905 Unspecified sensorineural hearing loss: Secondary | ICD-10-CM | POA: Diagnosis not present

## 2020-07-05 DIAGNOSIS — Z23 Encounter for immunization: Secondary | ICD-10-CM | POA: Diagnosis not present

## 2020-07-05 DIAGNOSIS — W06XXXA Fall from bed, initial encounter: Secondary | ICD-10-CM | POA: Diagnosis not present

## 2020-07-05 DIAGNOSIS — Y92009 Unspecified place in unspecified non-institutional (private) residence as the place of occurrence of the external cause: Secondary | ICD-10-CM | POA: Diagnosis not present

## 2020-07-05 DIAGNOSIS — S61213A Laceration without foreign body of left middle finger without damage to nail, initial encounter: Secondary | ICD-10-CM | POA: Diagnosis not present

## 2020-08-31 DIAGNOSIS — K5900 Constipation, unspecified: Secondary | ICD-10-CM | POA: Diagnosis not present

## 2020-08-31 DIAGNOSIS — E119 Type 2 diabetes mellitus without complications: Secondary | ICD-10-CM | POA: Diagnosis not present

## 2020-08-31 DIAGNOSIS — Z7984 Long term (current) use of oral hypoglycemic drugs: Secondary | ICD-10-CM | POA: Diagnosis not present

## 2020-08-31 DIAGNOSIS — R451 Restlessness and agitation: Secondary | ICD-10-CM | POA: Diagnosis not present

## 2020-08-31 DIAGNOSIS — I10 Essential (primary) hypertension: Secondary | ICD-10-CM | POA: Diagnosis not present

## 2020-08-31 DIAGNOSIS — F17211 Nicotine dependence, cigarettes, in remission: Secondary | ICD-10-CM | POA: Diagnosis not present

## 2020-08-31 DIAGNOSIS — M62838 Other muscle spasm: Secondary | ICD-10-CM | POA: Diagnosis not present

## 2020-08-31 DIAGNOSIS — Z5900 Homelessness unspecified: Secondary | ICD-10-CM | POA: Diagnosis not present

## 2021-03-28 ENCOUNTER — Encounter: Payer: Self-pay | Admitting: Internal Medicine
